# Patient Record
Sex: Female | Born: 1935 | Race: White | Hispanic: No | Smoking: Former smoker
Health system: Southern US, Community
[De-identification: ages and names within clinical notes are randomized; demographics above are authoritative.]

## PROBLEM LIST (undated history)

## (undated) DIAGNOSIS — T8859XA Other complications of anesthesia, initial encounter: Secondary | ICD-10-CM

## (undated) DIAGNOSIS — I209 Angina pectoris, unspecified: Secondary | ICD-10-CM

## (undated) DIAGNOSIS — Z87898 Personal history of other specified conditions: Secondary | ICD-10-CM

## (undated) DIAGNOSIS — C801 Malignant (primary) neoplasm, unspecified: Secondary | ICD-10-CM

## (undated) DIAGNOSIS — I214 Non-ST elevation (NSTEMI) myocardial infarction: Secondary | ICD-10-CM

## (undated) DIAGNOSIS — I10 Essential (primary) hypertension: Secondary | ICD-10-CM

## (undated) DIAGNOSIS — I639 Cerebral infarction, unspecified: Secondary | ICD-10-CM

## (undated) DIAGNOSIS — J189 Pneumonia, unspecified organism: Secondary | ICD-10-CM

## (undated) DIAGNOSIS — R112 Nausea with vomiting, unspecified: Secondary | ICD-10-CM

## (undated) DIAGNOSIS — M1990 Unspecified osteoarthritis, unspecified site: Secondary | ICD-10-CM

## (undated) DIAGNOSIS — E785 Hyperlipidemia, unspecified: Secondary | ICD-10-CM

## (undated) DIAGNOSIS — I2510 Atherosclerotic heart disease of native coronary artery without angina pectoris: Secondary | ICD-10-CM

## (undated) DIAGNOSIS — Z9889 Other specified postprocedural states: Secondary | ICD-10-CM

## (undated) DIAGNOSIS — T4145XA Adverse effect of unspecified anesthetic, initial encounter: Secondary | ICD-10-CM

## (undated) HISTORY — DX: Hyperlipidemia, unspecified: E78.5

## (undated) HISTORY — DX: Atherosclerotic heart disease of native coronary artery without angina pectoris: I25.10

## (undated) HISTORY — PX: EYE SURGERY: SHX253

## (undated) HISTORY — DX: Non-ST elevation (NSTEMI) myocardial infarction: I21.4

---

## 1969-08-17 HISTORY — PX: FRACTURE SURGERY: SHX138

## 1972-08-17 HISTORY — PX: ABDOMINAL HYSTERECTOMY: SHX81

## 1974-08-17 HISTORY — PX: BREAST SURGERY: SHX581

## 1998-10-31 ENCOUNTER — Other Ambulatory Visit: Admission: RE | Admit: 1998-10-31 | Discharge: 1998-10-31 | Payer: Self-pay | Admitting: Obstetrics and Gynecology

## 2000-01-05 ENCOUNTER — Other Ambulatory Visit: Admission: RE | Admit: 2000-01-05 | Discharge: 2000-01-05 | Payer: Self-pay | Admitting: Obstetrics and Gynecology

## 2001-01-06 ENCOUNTER — Other Ambulatory Visit: Admission: RE | Admit: 2001-01-06 | Discharge: 2001-01-06 | Payer: Self-pay | Admitting: Obstetrics and Gynecology

## 2001-01-07 ENCOUNTER — Encounter (INDEPENDENT_AMBULATORY_CARE_PROVIDER_SITE_OTHER): Payer: Self-pay

## 2001-01-07 ENCOUNTER — Other Ambulatory Visit: Admission: RE | Admit: 2001-01-07 | Discharge: 2001-01-07 | Payer: Self-pay | Admitting: Obstetrics and Gynecology

## 2002-01-11 ENCOUNTER — Other Ambulatory Visit: Admission: RE | Admit: 2002-01-11 | Discharge: 2002-01-11 | Payer: Self-pay | Admitting: Obstetrics and Gynecology

## 2003-11-20 ENCOUNTER — Encounter: Admission: RE | Admit: 2003-11-20 | Discharge: 2003-11-20 | Payer: Self-pay | Admitting: Internal Medicine

## 2003-11-23 ENCOUNTER — Emergency Department (HOSPITAL_COMMUNITY): Admission: EM | Admit: 2003-11-23 | Discharge: 2003-11-23 | Payer: Self-pay | Admitting: Emergency Medicine

## 2003-12-12 ENCOUNTER — Inpatient Hospital Stay (HOSPITAL_COMMUNITY): Admission: EM | Admit: 2003-12-12 | Discharge: 2003-12-20 | Payer: Self-pay | Admitting: Emergency Medicine

## 2004-01-01 ENCOUNTER — Ambulatory Visit (HOSPITAL_COMMUNITY): Admission: RE | Admit: 2004-01-01 | Discharge: 2004-01-01 | Payer: Self-pay | Admitting: General Surgery

## 2004-10-11 ENCOUNTER — Emergency Department (HOSPITAL_COMMUNITY): Admission: EM | Admit: 2004-10-11 | Discharge: 2004-10-11 | Payer: Self-pay | Admitting: Family Medicine

## 2004-11-15 ENCOUNTER — Emergency Department (HOSPITAL_COMMUNITY): Admission: EM | Admit: 2004-11-15 | Discharge: 2004-11-15 | Payer: Self-pay | Admitting: Family Medicine

## 2004-12-19 ENCOUNTER — Ambulatory Visit (HOSPITAL_COMMUNITY): Admission: RE | Admit: 2004-12-19 | Discharge: 2004-12-19 | Payer: Self-pay | Admitting: Gastroenterology

## 2004-12-19 ENCOUNTER — Encounter (INDEPENDENT_AMBULATORY_CARE_PROVIDER_SITE_OTHER): Payer: Self-pay | Admitting: *Deleted

## 2005-09-18 ENCOUNTER — Emergency Department (HOSPITAL_COMMUNITY): Admission: EM | Admit: 2005-09-18 | Discharge: 2005-09-18 | Payer: Self-pay | Admitting: Family Medicine

## 2005-11-21 ENCOUNTER — Encounter: Admission: RE | Admit: 2005-11-21 | Discharge: 2005-11-21 | Payer: Self-pay | Admitting: Internal Medicine

## 2005-11-30 ENCOUNTER — Encounter: Admission: RE | Admit: 2005-11-30 | Discharge: 2005-11-30 | Payer: Self-pay | Admitting: Internal Medicine

## 2005-12-08 ENCOUNTER — Encounter: Admission: RE | Admit: 2005-12-08 | Discharge: 2005-12-08 | Payer: Self-pay | Admitting: Internal Medicine

## 2005-12-14 ENCOUNTER — Encounter (INDEPENDENT_AMBULATORY_CARE_PROVIDER_SITE_OTHER): Payer: Self-pay | Admitting: *Deleted

## 2005-12-14 ENCOUNTER — Ambulatory Visit (HOSPITAL_COMMUNITY): Admission: RE | Admit: 2005-12-14 | Discharge: 2005-12-14 | Payer: Self-pay | Admitting: Internal Medicine

## 2007-08-18 DIAGNOSIS — I639 Cerebral infarction, unspecified: Secondary | ICD-10-CM

## 2007-08-18 HISTORY — DX: Cerebral infarction, unspecified: I63.9

## 2008-02-26 ENCOUNTER — Emergency Department (HOSPITAL_COMMUNITY): Admission: EM | Admit: 2008-02-26 | Discharge: 2008-02-26 | Payer: Self-pay | Admitting: Family Medicine

## 2009-04-05 ENCOUNTER — Emergency Department (HOSPITAL_COMMUNITY): Admission: EM | Admit: 2009-04-05 | Discharge: 2009-04-05 | Payer: Self-pay | Admitting: Emergency Medicine

## 2009-07-24 ENCOUNTER — Emergency Department (HOSPITAL_COMMUNITY): Admission: EM | Admit: 2009-07-24 | Discharge: 2009-07-24 | Payer: Self-pay | Admitting: Family Medicine

## 2010-08-03 ENCOUNTER — Emergency Department (HOSPITAL_COMMUNITY)
Admission: EM | Admit: 2010-08-03 | Discharge: 2010-08-03 | Payer: Self-pay | Source: Home / Self Care | Admitting: Emergency Medicine

## 2010-11-03 ENCOUNTER — Inpatient Hospital Stay (INDEPENDENT_AMBULATORY_CARE_PROVIDER_SITE_OTHER)
Admission: RE | Admit: 2010-11-03 | Discharge: 2010-11-03 | Disposition: A | Discharge status: Home / Self Care | Payer: Medicare Other | Source: Ambulatory Visit | Attending: Emergency Medicine | Admitting: Emergency Medicine

## 2010-11-03 DIAGNOSIS — J029 Acute pharyngitis, unspecified: Secondary | ICD-10-CM

## 2010-11-03 DIAGNOSIS — I10 Essential (primary) hypertension: Secondary | ICD-10-CM

## 2010-11-03 DIAGNOSIS — J309 Allergic rhinitis, unspecified: Secondary | ICD-10-CM

## 2011-01-02 NOTE — Op Note (Signed)
Crystal Day, RAMAKER                ACCOUNT NO.:  [id number]   MEDICAL RECORD NO.:  [id number]          PATIENT TYPE:  AMB   LOCATION:  ENDO                         FACILITY:  [facility]   PHYSICIAN:  Bernette Redbird, M.D.   DATE OF BIRTH:  1934/12/19   DATE OF PROCEDURE:  12/19/2004  DATE OF DISCHARGE:                                 OPERATIVE REPORT   PROCEDURE:  Colonoscopy with biopsy.   INDICATION:  A 76 year old for colon cancer screening.   ENDOSCOPIST:  Bernette Redbird, M.D.   FINDINGS:  Diminutive rectal polyps.   DESCRIPTION OF PROCEDURE:  The nature, purpose, and risks of the procedure  have been discussed with the patient who provided written consent. Sedation  was fentanyl 100 mcg and Versed 9.5 mg IV without arrhythmias or  desaturation. The Olympus adjustable tension pediatric video colonoscope was  advanced with moderate difficulty through a fixated and angulated sigmoid  region and then fairly easily, with the help of some external abdominal  compression with the patient in the supine position, to the terminal ileum  which was entered for a moderate distance and appeared normal. Pullback was  then performed. The quality of the prep was excellent, and it is felt that  all areas were well seen.   In the rectum were some diminutive sessile hyperplastic-appearing polyps  which were sampled by cold biopsy, although some of the smallest latticed  ones were not biopsied. There were no worrisome polyps on this exam, no  large polyps, no cancer, colitis, vascular malformations, or diverticulosis.  Retroflexion was attempted to the rectum but was not accomplished due to a  small rectal ampulla.   The patient tolerated the procedure well, and there no apparent  complications.   IMPRESSION:  Diminutive rectal polyps biopsied as described above (211.4).   PLAN:  Await pathology results.     RB/MEDQ  D:  12/19/2004  T:  12/19/2004  Job:  [id number]   cc:   Georgann Housekeeper, MD  [address]  [place]  [place] [address]  Fax: [phone]

## 2011-01-02 NOTE — H&P (Signed)
NAMESHANNEN, FLANSBURG                          ACCOUNT NO.:  [id number]   MEDICAL RECORD NO.:  [id number]                   PATIENT TYPE:  INP   LOCATION:  5725                                 FACILITY:  [facility]   PHYSICIAN:  Georgann Housekeeper, M.D.                 DATE OF BIRTH:  Dec 31, 1934   DATE OF ADMISSION:  12/12/2003  DATE OF DISCHARGE:                                HISTORY & PHYSICAL   PRIMARY CARE PHYSICIAN:  Dr. Lilla Shook.   CHIEF COMPLAINT:  Abdominal pain and nausea.   HISTORY OF PRESENT ILLNESS:  A 76 year old female with no significant past  medical history, initially started having some abdominal pain and some  nausea symptoms, started having these about a week when she had some beans  at the school and she had a little bit of a diarrheal illness 2 days after  that; she thought it food-related.  She continues to have some abdominal  pain and some mild distention and diarrhea stopped and there was no nausea  and she was seen on December 10, 2003; at that time, her blood work including  blood chemistries, lipase, amylase, CBC and urinalysis were all normal.  Her  x-rays showed some questionable partial bowel obstruction.  The patient was  sent home on a liquid diet and because she was not having any fevers or any  vomiting or nausea, she continued on a liquid diet for the next 2 days, came  back with symptoms of still abdominal pain and some nausea, no diarrhea; she  had a little bit of loose stools too, feels the belly is pretty sore and  still mildly distended, no fevers, no blood in the stool or vomitus.   PAST MEDICAL HISTORY:  Postmenopausal.   MEDICATIONS:  Premarin 0.625 mg daily.   ALLERGIES:  Allergies to PENICILLIN, TETRACYCLINE, ERYTHROMYCIN and  MACRODANTIN.   PAST SURGICAL HISTORY:  1. Status post TAH.  2. Status post appendectomy.  3. Status post lump removed from the breast, benign.   SOCIAL HISTORY:  No tobacco, no alcohol.  Married with 2  children.   REVIEW OF SYSTEMS:  Review of systems as above.   PHYSICAL EXAMINATION:  VITAL SIGNS:  On exam, temperature 97.6, blood  pressure 130/80, pulse 68.  GENERAL:  Well-appearing female in mild distress secondary to the pain.  HEENT:  Pupils reactive.  Extraocular muscles intact.  NECK:  Neck was supple.  LUNGS:  Lungs were clear.  CARDIAC:  Exam reveals S1 and S2 without murmurs.  ABDOMEN:  Abdomen was soft; slightly high-pitched bowel sounds, slightly  decreased; tender lower abdomen, diffuse.  No rebound or guarding.  NEUROLOGIC:  Exam nonfocal.   LABORATORY DATA:  Laboratory data from December 10, 2003:  White count was 8.2;  lipase 29, amylase 59.  Chemistries:  Sodium 139, potassium 4.6, BUN of 11,  creatinine 0.9.  LFTs were normal.  UA was negative.   X-ray showed partial small-bowel obstruction.  All the data is from December 10, 2003.   Labs from today and x-rays are pending.   IMPRESSION:  [age]-year-old female with abdominal pain and nausea over  the last week with some small-bowel obstruction on the x-ray, history of  abdominal surgeries.   Partial small-bowel obstruction, rule out mechanical adhesions.   PLAN:  Admit to floor to the [facility] hospitalists.  CT scan of the abdomen and  pelvic.  N.p.o., normal saline fluids with some potassium, NG tube.  If  __________ followup on the blood work including blood chemistries and CBC  and surgical consult if needed.                                                Georgann Housekeeper, M.D.    Arliss Journey  D:  12/12/2003  T:  12/12/2003  Job:  [id number]

## 2011-01-02 NOTE — Consult Note (Signed)
NAME:  Crystal Day, Crystal Day                          ACCOUNT NO.:  [id number]   MEDICAL RECORD NO.:  [id number]                   PATIENT TYPE:  INP   LOCATION:  5704                                 FACILITY:  [facility]   PHYSICIAN:  Timothy E. Earlene Plater, M.D.              DATE OF BIRTH:  July 30, 1935   DATE OF CONSULTATION:  12/18/2003  DATE OF DISCHARGE:                                   CONSULTATION   I have been asked to see Ms. Mcmath today by Dr. Julio Sicks regarding  abdominal pain and possible bowel obstruction.   She is 56, is up and about the room feeling better than five days ago and in  no acute distress at this time.  She converses freely about her disease  process.  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  She did not give it much thought, but it did progress with mild  discomfort and no further diarrhea.  However, prior to admission on the  27th, she experienced more and more abdominal pain and although she had one  solid bowel movement, for the most part she began having diarrhea.  She was  seen in the office one day and admitted the next day.  Abdominal films  reportedly showed evidence of bowel obstruction.  The patient has been in  the hospital now six days, has had multiple x-rays including three CT scans.  She felt as though she was making progress advancing from clear liquids to  regular diet which she tried to eat and was able to only eat small amounts.  Her bowels had been loose until 72 hours ago and she has had no bowel  movement since.  Her CT scans actually show improvement in the evidence for  bowel obstruction, i.e. dilated small bowel.  She has never had an episode  like this.  She has never been really sick or ill and she has no significant  past history contributory.  She did have a total abdominal hysterectomy and  appendectomy at the same time in 1975 and had a  breast biopsy remotely.  She  has not had any GI problems or distress in the interim and this is her first  episode of real abdominal illness.   I have reviewed her dietary history, her laboratory value, her vital signs.   A careful review of her x-rays was done in the x-ray department.   PHYSICAL EXAMINATION:  GENERAL:  Today as noted the patient is up and about  the room.  She is in no distress.  She is afebrile.  VITAL SIGNS:  Good.  CHEST:  Clear to auscultation.  HEART:  Regular and normal.  ABDOMEN:  Distended, soft.  Bowel sounds are diminished.  There is no pain  with percussion or palpation today.  I find no evidence  of masses or  organomegaly at this time.   I would strongly suggest stopping her regular food, cutting back to clear  liquids only, increasing her IV fluids, checking her electrolytes and I will  repeat today a simple flat and upright abdomen which should give [place] a good  idea of her progress.  I appreciate this consultation and will be glad to  follow with you.                                               Timothy E. Earlene Plater, M.D.    TED/MEDQ  D:  12/18/2003  T:  12/19/2003  Job:  [id number]

## 2011-01-02 NOTE — Discharge Summary (Signed)
Crystal Day, Crystal Day                          ACCOUNT NO.:  [id number]   MEDICAL RECORD NO.:  [id number]                   PATIENT TYPE:  INP   LOCATION:  5704                                 FACILITY:  [facility]   PHYSICIAN:  Crystal Day, M.D.             DATE OF BIRTH:  [birth year]   DATE OF ADMISSION:  12/12/2003  DATE OF DISCHARGE:  12/20/2003                                 DISCHARGE SUMMARY   DISCHARGE DIAGNOSES:  1. Partial small-bowel obstruction -- almost completely resolved.  2. Abdominal pain and nausea -- resolved.   DISCHARGE MEDICATIONS:  1. The patient is to resume her preadmission medications of Premarin 0.625     mg daily.  2. New medicine is Reglan 5 mg t.i.d. p.o.   DISCHARGE LABORATORIES:  WBC count 5.4, hemoglobin 13.8, hematocrit 39.3,  MCV 86.0, platelet count 198,000.  Sodium 139, potassium 4.0, chloride 110,  CO2 26, glucose 98, BUN 7, creatinine 0.9, calcium 8.8.   CONSULTANTS:  Dr. Sheppard Plumber. Earlene Plater of general surgery.   PROCEDURES:  Not applicable.   CONDITION ON DISCHARGE:  Improved and satisfactory.   ACTIVITY:  Activity as tolerated.   DIET:  Full liquid diet.  The patient is to advance diet as tolerated; she  will be given instructions in this respect prior to discharge.   SPECIAL INSTRUCTIONS:  Special instructions including dietary instructions  to be given to patient, as mentioned above.  She was instructed to report to  doctor if she experiences any symptoms of nausea, vomiting, abdominal pain.   FOLLOWUP:  She is to call Dr. Earlene Plater for an appointment to see him in 10-14  days.  Routine followup with her primary care physician, Dr. Lilla Shook.   REASON FOR HOSPITALIZATION:  Abdominal pain and nausea secondary to partial  small-bowel obstruction.  Please see admission H&P by Dr. Georgann Housekeeper  dated December 12, 2003.  She presented with history of abdominal pain and  nausea which had been going about a week prior to admission  after eating  beans.  She was admitted to the hospital after being seen in the doctor's  office on account of bowel obstruction on x-ray.   According to Dr. Jerelyn Scott Husain's H&P, the patient's BP was 130/80, pulse of  58, temperature of 97.6 degrees Fahrenheit.  She was in mild painful  distress at the time of admission.  Her lungs were clear.  Cardiac exam was  unremarkable.  Abdominal exam was said to have shown high-pitched bowel  sounds with some lower abdominal tenderness.  Neurological exam was said to  be nonfocal.   X-rays showed a partial small-bowel obstruction; she was therefore admitted  to the hospital for management of bowel obstruction and abdominal pain.   HOSPITAL COURSE:  She was admitted to the hospitalists' service.  CT scan of  the abdomen was done which confirmed bowel obstruction.  She was initially  made n.p.o. with IV fluid supplementation and monitoring of her electrolytes  and correction of hypokalemia.  With this bowel rest regimen, the patient's  symptoms improved somewhat and subsequently, her diet was slightly adjusted  to clear liquids.  She had been nauseated and also received antiemetics in  addition to pain medicines.  On Dec 21, 2003, the patient's symptoms have  gotten worse after being maintained on a regular diet and therefore, Dr.  Earlene Plater of surgery was consulted, who recommended cutting the patient back to  clear liquids with monitoring of electrolytes and IV fluid supplementation.  Over the last 48 hours, the patient's symptoms have improved remarkably and  she does not have any abdominal pain or nausea on full liquids and she is  deemed appropriate for discharge today.  On rounds this morning, the patient  denies any fever, chills, abdominal pain, nausea or vomiting.  She has been  able to open her bowels without any problems; she has passed gas as well.  Her abdomen is no more distended, her bowel sounds were present and abdomen  was nontender on  palpation.  Her BP was 136/63, pulse rate of 57,  temperature 97.4 degrees Fahrenheit, an O2 saturation of 96% on room air,  respiratory rate of 18.  Lungs were clear to auscultation.  Cardiac  auscultation was negative for any gallops or murmur.  She does not have any  edema on extremity exam and she is clinically not dehydrated.  The patient  is therefore going to be discharged home on full liquids with instructions  on advancing her diet as tolerated, to report to M.D. should there be any  problems.  She does not have any dizziness or lightheadedness and is not  clinically dehydrated, as mentioned above, and she is deemed appropriate for  home.  She is not ill-looking or lethargic-looking also, but she has been  ambulatory on the medical floor without any problems, as mentioned above.                                                Crystal Day, M.D.    GO/MEDQ  D:  12/20/2003  T:  12/20/2003  Job:  [id number]   cc:   Sheppard Plumber. Earlene Plater, M.D.  [address]  [place]  [place] [address]  Fax: [phone]   Lilla Shook, M.D.  [address]  [place]  [place] [address]  Fax: [phone]

## 2013-09-28 ENCOUNTER — Encounter (HOSPITAL_COMMUNITY): Payer: Self-pay | Admitting: Emergency Medicine

## 2013-09-28 ENCOUNTER — Inpatient Hospital Stay (HOSPITAL_COMMUNITY)
Admission: EM | Admit: 2013-09-28 | Discharge: 2013-09-30 | DRG: 281 | Disposition: A | Discharge status: Home / Self Care | Payer: Medicare Other | Attending: Cardiovascular Disease | Admitting: Cardiovascular Disease

## 2013-09-28 ENCOUNTER — Emergency Department (HOSPITAL_COMMUNITY): Payer: Medicare Other

## 2013-09-28 DIAGNOSIS — R079 Chest pain, unspecified: Secondary | ICD-10-CM

## 2013-09-28 DIAGNOSIS — N189 Chronic kidney disease, unspecified: Secondary | ICD-10-CM | POA: Diagnosis present

## 2013-09-28 DIAGNOSIS — I214 Non-ST elevation (NSTEMI) myocardial infarction: Principal | ICD-10-CM

## 2013-09-28 DIAGNOSIS — I129 Hypertensive chronic kidney disease with stage 1 through stage 4 chronic kidney disease, or unspecified chronic kidney disease: Secondary | ICD-10-CM | POA: Diagnosis present

## 2013-09-28 DIAGNOSIS — N179 Acute kidney failure, unspecified: Secondary | ICD-10-CM | POA: Diagnosis present

## 2013-09-28 DIAGNOSIS — I10 Essential (primary) hypertension: Secondary | ICD-10-CM | POA: Diagnosis present

## 2013-09-28 DIAGNOSIS — R7309 Other abnormal glucose: Secondary | ICD-10-CM | POA: Diagnosis present

## 2013-09-28 DIAGNOSIS — Z87891 Personal history of nicotine dependence: Secondary | ICD-10-CM

## 2013-09-28 DIAGNOSIS — E785 Hyperlipidemia, unspecified: Secondary | ICD-10-CM | POA: Diagnosis present

## 2013-09-28 HISTORY — DX: Pneumonia, unspecified organism: J18.9

## 2013-09-28 HISTORY — DX: Angina pectoris, unspecified: I20.9

## 2013-09-28 HISTORY — DX: Malignant (primary) neoplasm, unspecified: C80.1

## 2013-09-28 HISTORY — DX: Unspecified osteoarthritis, unspecified site: M19.90

## 2013-09-28 HISTORY — DX: Essential (primary) hypertension: I10

## 2013-09-28 HISTORY — DX: Cerebral infarction, unspecified: I63.9

## 2013-09-28 LAB — CBC WITH DIFFERENTIAL/PLATELET
Basophils Absolute: 0.1 10*3/uL (ref 0.0–0.1)
Basophils Relative: 1 % (ref 0–1)
Eosinophils Absolute: 0.2 10*3/uL (ref 0.0–0.7)
Eosinophils Relative: 3 % (ref 0–5)
HCT: 42.9 % (ref 36.0–46.0)
Hemoglobin: 14.9 g/dL (ref 12.0–15.0)
Lymphocytes Relative: 30 % (ref 12–46)
Lymphs Abs: 2.3 10*3/uL (ref 0.7–4.0)
MCH: 29.6 pg (ref 26.0–34.0)
MCHC: 34.7 g/dL (ref 30.0–36.0)
MCV: 85.1 fL (ref 78.0–100.0)
Monocytes Absolute: 0.5 10*3/uL (ref 0.1–1.0)
Monocytes Relative: 7 % (ref 3–12)
Neutro Abs: 4.6 10*3/uL (ref 1.7–7.7)
Neutrophils Relative %: 60 % (ref 43–77)
Platelets: 185 10*3/uL (ref 150–400)
RBC: 5.04 MIL/uL (ref 3.87–5.11)
RDW: 12.5 % (ref 11.5–15.5)
WBC: 7.7 10*3/uL (ref 4.0–10.5)

## 2013-09-28 LAB — COMPREHENSIVE METABOLIC PANEL
ALT: 9 U/L (ref 0–35)
AST: 23 U/L (ref 0–37)
Albumin: 3.9 g/dL (ref 3.5–5.2)
Alkaline Phosphatase: 57 U/L (ref 39–117)
BUN: 19 mg/dL (ref 6–23)
CO2: 27 mEq/L (ref 19–32)
Calcium: 9.7 mg/dL (ref 8.4–10.5)
Chloride: 100 mEq/L (ref 96–112)
Creatinine, Ser: 1.18 mg/dL — ABNORMAL HIGH (ref 0.50–1.10)
GFR calc Af Amer: 51 mL/min — ABNORMAL LOW (ref >90)
GFR calc non Af Amer: 44 mL/min — ABNORMAL LOW (ref >90)
Glucose, Bld: 164 mg/dL — ABNORMAL HIGH (ref 70–99)
Potassium: 4 mEq/L (ref 3.7–5.3)
Sodium: 143 mEq/L (ref 137–147)
Total Bilirubin: 0.4 mg/dL (ref 0.3–1.2)
Total Protein: 7.3 g/dL (ref 6.0–8.3)

## 2013-09-28 LAB — POCT I-STAT, CHEM 8
BUN: 19 mg/dL (ref 6–23)
Calcium, Ion: 1.18 mmol/L (ref 1.13–1.30)
Chloride: 100 mEq/L (ref 96–112)
Creatinine, Ser: 1.4 mg/dL — ABNORMAL HIGH (ref 0.50–1.10)
Glucose, Bld: 166 mg/dL — ABNORMAL HIGH (ref 70–99)
HCT: 45 % (ref 36.0–46.0)
Hemoglobin: 15.3 g/dL — ABNORMAL HIGH (ref 12.0–15.0)
Potassium: 3.7 mEq/L (ref 3.7–5.3)
Sodium: 142 mEq/L (ref 137–147)
TCO2: 28 mmol/L (ref 0–100)

## 2013-09-28 LAB — POCT I-STAT TROPONIN I: Troponin i, poc: 0.01 ng/mL (ref 0.00–0.08)

## 2013-09-28 LAB — TROPONIN I: Troponin I: 4.64 ng/mL (ref <0.30)

## 2013-09-28 LAB — MRSA PCR SCREENING: MRSA by PCR: NEGATIVE

## 2013-09-28 LAB — LIPASE, BLOOD: Lipase: 28 U/L (ref 11–59)

## 2013-09-28 MED ORDER — ASPIRIN 81 MG PO CHEW
324.0000 mg | CHEWABLE_TABLET | Freq: Once | ORAL | Status: AC
  Administered 2013-09-28: 324 mg via ORAL
  Filled 2013-09-28: qty 4

## 2013-09-28 MED ORDER — ASPIRIN 81 MG PO CHEW
81.0000 mg | CHEWABLE_TABLET | ORAL | Status: AC
  Administered 2013-09-29: 81 mg via ORAL
  Filled 2013-09-28: qty 1

## 2013-09-28 MED ORDER — SODIUM CHLORIDE 0.9 % IJ SOLN
3.0000 mL | Freq: Two times a day (BID) | INTRAMUSCULAR | Status: DC
  Administered 2013-09-28: 3 mL via INTRAVENOUS

## 2013-09-28 MED ORDER — NITROGLYCERIN 0.4 MG SL SUBL
0.4000 mg | SUBLINGUAL_TABLET | Freq: Once | SUBLINGUAL | Status: AC
  Administered 2013-09-28: 0.4 mg via SUBLINGUAL
  Filled 2013-09-28: qty 25

## 2013-09-28 MED ORDER — SODIUM CHLORIDE 0.9 % IV SOLN: 250.0000 mL | INTRAVENOUS | Status: DC | PRN

## 2013-09-28 MED ORDER — ASPIRIN EC 81 MG PO TBEC: 81.0000 mg | DELAYED_RELEASE_TABLET | Freq: Every day | ORAL | Status: DC

## 2013-09-28 MED ORDER — ATORVASTATIN CALCIUM 10 MG PO TABS
10.0000 mg | ORAL_TABLET | Freq: Every day | ORAL | Status: DC
  Filled 2013-09-28: qty 1

## 2013-09-28 MED ORDER — NITROGLYCERIN 0.4 MG SL SUBL: 0.4000 mg | SUBLINGUAL_TABLET | SUBLINGUAL | Status: DC | PRN

## 2013-09-28 MED ORDER — METOPROLOL TARTRATE 25 MG PO TABS
25.0000 mg | ORAL_TABLET | Freq: Two times a day (BID) | ORAL | Status: DC
  Administered 2013-09-28 – 2013-09-30 (×3): 25 mg via ORAL
  Filled 2013-09-28 (×5): qty 1

## 2013-09-28 MED ORDER — ONDANSETRON HCL 4 MG/2ML IJ SOLN
4.0000 mg | Freq: Once | INTRAMUSCULAR | Status: AC
  Administered 2013-09-28: 4 mg via INTRAVENOUS
  Filled 2013-09-28: qty 2

## 2013-09-28 MED ORDER — SODIUM CHLORIDE 0.9 % IJ SOLN: 3.0000 mL | INTRAMUSCULAR | Status: DC | PRN

## 2013-09-28 MED ORDER — NITROGLYCERIN IN D5W 200-5 MCG/ML-% IV SOLN
5.0000 ug/min | INTRAVENOUS | Status: DC
  Administered 2013-09-28: 5 ug/min via INTRAVENOUS
  Filled 2013-09-28: qty 250

## 2013-09-28 MED ORDER — HEPARIN BOLUS VIA INFUSION
3000.0000 [IU] | Freq: Once | INTRAVENOUS | Status: AC
  Administered 2013-09-28: 3000 [IU] via INTRAVENOUS
  Filled 2013-09-28: qty 3000

## 2013-09-28 MED ORDER — HEPARIN (PORCINE) IN NACL 100-0.45 UNIT/ML-% IJ SOLN
950.0000 [IU]/h | INTRAMUSCULAR | Status: DC
  Administered 2013-09-28: 1100 [IU]/h via INTRAVENOUS
  Filled 2013-09-28 (×2): qty 250

## 2013-09-28 NOTE — ED Provider Notes (Signed)
CSN: [id number]     Arrival date & time 09/28/13  1458 History   First MD Initiated Contact with Patient 09/28/13 1508     Chief Complaint  Patient presents with  . Chest Pain     (Consider location/radiation/quality/duration/timing/severity/associated sxs/prior Treatment) Patient is a 79 y.o. female presenting with chest pain. The history is provided by the patient.  Chest Pain Associated symptoms: nausea   Associated symptoms: no abdominal pain, no back pain, no headache, no numbness, no shortness of breath, not vomiting and no weakness   the patient developed chest pressure around 30 minutes prior to arrival. She states it was sharp and radiated somewhat to her back. Is also pressure with it. She states she's been having pains to last year when she lays down at night. They come and go. She states that pain is different than this pain. She states she's had some nausea. No fevers. No cough. No weight loss. She is a former smoker.  Past Medical History  Diagnosis Date  . Hypertension    History reviewed. No pertinent past surgical history. Family History  Problem Relation Age of Onset  . Heart attack Father 48  . Cancer Mother    History  Substance Use Topics  . Smoking status: Former Smoker    Quit date: 08/17/1981  . Smokeless tobacco: Not on file  . Alcohol Use: Not on file   OB History   Grav Para Term Preterm Abortions TAB SAB Ect Mult Living                 Review of Systems  Constitutional: Negative for activity change and appetite change.  Eyes: Negative for pain.  Respiratory: Negative for chest tightness and shortness of breath.   Cardiovascular: Positive for chest pain. Negative for leg swelling.  Gastrointestinal: Positive for nausea. Negative for vomiting, abdominal pain and diarrhea.  Genitourinary: Negative for flank pain.  Musculoskeletal: Negative for back pain and neck stiffness.  Skin: Negative for rash.  Neurological: Negative for weakness, numbness  and headaches.  Psychiatric/Behavioral: Negative for behavioral problems.      Allergies  Penicillins; Tetracyclines & related; Elavil; Erythromycin; and Macrodantin  Home Medications   Current Outpatient Rx  Name  Route  Sig  Dispense  Refill  . valsartan-hydrochlorothiazide (DIOVAN-HCT) 320-25 MG per tablet   Oral   Take 1 tablet by mouth daily.          BP 154/72  Pulse 82  Temp(Src) 97.2 F (36.2 C) (Oral)  Resp 13  Wt 155 lb (70.308 kg)  SpO2 96% Physical Exam  Nursing note and vitals reviewed. Constitutional: She is oriented to person, place, and time. She appears well-developed and well-nourished.  HENT:  Head: Normocephalic and atraumatic.  Eyes: EOM are normal. Pupils are equal, round, and reactive to light.  Neck: Normal range of motion. Neck supple.  Cardiovascular: Normal rate, regular rhythm and normal heart sounds.   No murmur heard. Pulmonary/Chest: Effort normal and breath sounds normal. No respiratory distress. She has no wheezes. She has no rales.  Abdominal: Soft. Bowel sounds are normal. She exhibits no distension. There is no tenderness. There is no rebound and no guarding.  Musculoskeletal: Normal range of motion.  Neurological: She is alert and oriented to person, place, and time. No cranial nerve deficit.  Skin: Skin is warm and dry.  Psychiatric: She has a normal mood and affect. Her speech is normal.    ED Course  Procedures (including critical care time) Labs  Review Labs Reviewed  COMPREHENSIVE METABOLIC PANEL - Abnormal; Notable for the following:    Glucose, Bld 164 (*)    Creatinine, Ser 1.18 (*)    GFR calc non Af Amer 44 (*)    GFR calc Af Amer 51 (*)    All other components within normal limits  POCT I-STAT, CHEM 8 - Abnormal; Notable for the following:    Creatinine, Ser 1.40 (*)    Glucose, Bld 166 (*)    Hemoglobin 15.3 (*)    All other components within normal limits  CBC WITH DIFFERENTIAL  LIPASE, BLOOD  POCT I-STAT  TROPONIN I   Imaging Review Dg Chest Port 1 View  09/28/2013   CLINICAL DATA:  Chest pain  EXAM: PORTABLE CHEST - 1 VIEW  COMPARISON:  DG CHEST 2 VIEW dated 08/03/2010; DG CHEST 2 VIEW dated 02/26/2008; US BIOPSY dated 12/14/2005  FINDINGS: Hyperinflation suggests COPD. Heart size is normal. Vascular pattern is normal. Lungs are clear. No pleural effusions.  IMPRESSION: No active disease.   Electronically Signed   By: Skipper Cliche M.D.   On: 09/28/2013 16:09    EKG Interpretation    Date/Time:  Thursday September 28 2013 15:02:24 EST Ventricular Rate:  99 PR Interval:  156 QRS Duration: 92 QT Interval:  372 QTC Calculation: 477 R Axis:   73 Text Interpretation:  Sinus rhythm with occasional Premature ventricular complexes Biatrial enlargement Marked ST abnormality, possible inferior subendocardial injury Abnormal ECG Confirmed by Grissel Tyrell  MD, Izacc Demeyer ([id number]) on 09/28/2013 3:10:09 PM            MDM   Final diagnoses:  Chest pain    Patient with chest pain. Has had pain for a year, but states his pain is different. The pain is improved with nitroglycerin. EKG shows nonspecific changes, but unknown if they're new or old. Troponin is negative. Pain is improved. Will be admitted by cardiologist    Jasper Riling. Alvino Chapel, MD 09/28/13 1718

## 2013-09-28 NOTE — H&P (Signed)
Crystal Day is an 79 y.o. female.   Chief Complaint: Chest Pain HPI:   The patient is a very active 79 yo female with a history of HTN, for which she takes losartan, heart murmur, and indigestion.  She presents with CP which had a sudden onset while walking to the mailbox.  This was right after eating a large lunch.  She had walked two miles earlier in the day and did senior aerobics without incident.  The pain was sharp, 10/10 with no SOB, diaphoresis or radiation.  She did become nauseated after arriving in the ER. For the last year she has been having chest pain, only at night, which wakes her from sleep and last 2-60 seconds.  BP was elevated at 218/81 and has improved on IV nitroglycerin.    Past Medical History  Diagnosis Date  . Hypertension     History reviewed. No pertinent past surgical history.  History reviewed. No pertinent family history. Social History:  reports that she has never smoked. She does not have any smokeless tobacco history on file. Her alcohol and drug histories are not on file.  Allergies:  Allergies  Allergen Reactions  . Penicillins Hives  . Tetracyclines & Related Nausea Only  . Elavil [Amitriptyline] Rash  . Erythromycin Rash  . Macrodantin [Nitrofurantoin Macrocrystal] Rash     (Not in a hospital admission)  Results for orders placed during the hospital encounter of 09/28/13 (from the past 48 hour(s))  CBC WITH DIFFERENTIAL     Status: None   Collection Time    09/28/13  3:19 PM      Result Value Ref Range   WBC 7.7  4.0 - 10.5 K/uL   RBC 5.04  3.87 - 5.11 MIL/uL   Hemoglobin 14.9  12.0 - 15.0 g/dL   HCT 42.9  36.0 - 46.0 %   MCV 85.1  78.0 - 100.0 fL   MCH 29.6  26.0 - 34.0 pg   MCHC 34.7  30.0 - 36.0 g/dL   RDW 12.5  11.5 - 15.5 %   Platelets 185  150 - 400 K/uL   Neutrophils Relative % 60  43 - 77 %   Neutro Abs 4.6  1.7 - 7.7 K/uL   Lymphocytes Relative 30  12 - 46 %   Lymphs Abs 2.3  0.7 - 4.0 K/uL   Monocytes Relative 7  3 - 12  %   Monocytes Absolute 0.5  0.1 - 1.0 K/uL   Eosinophils Relative 3  0 - 5 %   Eosinophils Absolute 0.2  0.0 - 0.7 K/uL   Basophils Relative 1  0 - 1 %   Basophils Absolute 0.1  0.0 - 0.1 K/uL  COMPREHENSIVE METABOLIC PANEL     Status: Abnormal   Collection Time    09/28/13  3:19 PM      Result Value Ref Range   Sodium 143  137 - 147 mEq/L   Potassium 4.0  3.7 - 5.3 mEq/L   Chloride 100  96 - 112 mEq/L   CO2 27  19 - 32 mEq/L   Glucose, Bld 164 (*) 70 - 99 mg/dL   BUN 19  6 - 23 mg/dL   Creatinine, Ser 1.18 (*) 0.50 - 1.10 mg/dL   Calcium 9.7  8.4 - 10.5 mg/dL   Total Protein 7.3  6.0 - 8.3 g/dL   Albumin 3.9  3.5 - 5.2 g/dL   AST 23  0 - 37 U/L   ALT  9  0 - 35 U/L   Alkaline Phosphatase 57  39 - 117 U/L   Total Bilirubin 0.4  0.3 - 1.2 mg/dL   GFR calc non Af Amer 44 (*) >90 mL/min   GFR calc Af Amer 51 (*) >90 mL/min   Comment: (NOTE)     The eGFR has been calculated using the CKD EPI equation.     This calculation has not been validated in all clinical situations.     eGFR's persistently <90 mL/min signify possible Chronic Kidney     Disease.  LIPASE, BLOOD     Status: None   Collection Time    09/28/13  3:19 PM      Result Value Ref Range   Lipase 28  11 - 59 U/L  POCT I-STAT TROPONIN I     Status: None   Collection Time    09/28/13  3:28 PM      Result Value Ref Range   Troponin i, poc 0.01  0.00 - 0.08 ng/mL   Comment 3            Comment: Due to the release kinetics of cTnI,     a negative result within the first hours     of the onset of symptoms does not rule out     myocardial infarction with certainty.     If myocardial infarction is still suspected,     repeat the test at appropriate intervals.  POCT I-STAT, CHEM 8     Status: Abnormal   Collection Time    09/28/13  3:30 PM      Result Value Ref Range   Sodium 142  137 - 147 mEq/L   Potassium 3.7  3.7 - 5.3 mEq/L   Chloride 100  96 - 112 mEq/L   BUN 19  6 - 23 mg/dL   Creatinine, Ser 1.40 (*) 0.50 -  1.10 mg/dL   Glucose, Bld 166 (*) 70 - 99 mg/dL   Calcium, Ion 1.18  1.13 - 1.30 mmol/L   TCO2 28  0 - 100 mmol/L   Hemoglobin 15.3 (*) 12.0 - 15.0 g/dL   HCT 45.0  36.0 - 46.0 %   Dg Chest Port 1 View  09/28/2013   CLINICAL DATA:  Chest pain  EXAM: PORTABLE CHEST - 1 VIEW  COMPARISON:  DG CHEST 2 VIEW dated 08/03/2010; DG CHEST 2 VIEW dated 02/26/2008; US BIOPSY dated 12/14/2005  FINDINGS: Hyperinflation suggests COPD. Heart size is normal. Vascular pattern is normal. Lungs are clear. No pleural effusions.  IMPRESSION: No active disease.   Electronically Signed   By: Skipper Cliche M.D.   On: 09/28/2013 16:09    Review of Systems  Constitutional: Negative for fever and diaphoresis.  HENT: Negative for congestion and sore throat.   Respiratory: Negative for cough and shortness of breath.   Cardiovascular: Positive for chest pain and palpitations ("racing heart" sometimes). Negative for orthopnea, leg swelling and PND.  Gastrointestinal: Positive for nausea. Negative for vomiting, blood in stool and melena.  Genitourinary: Negative for hematuria.  Musculoskeletal: Negative for myalgias.  Neurological: Negative for dizziness.  All other systems reviewed and are negative.    Blood pressure 158/86, pulse 90, temperature 97.2 F (36.2 C), temperature source Oral, resp. rate 14, weight 155 lb (70.308 kg), SpO2 96.00%. Physical Exam  Nursing note and vitals reviewed. Constitutional: She is oriented to person, place, and time. She appears well-developed and well-nourished. No distress.  HENT:  Head: Normocephalic and  atraumatic.  Mouth/Throat: No oropharyngeal exudate.  Eyes: EOM are normal. Pupils are equal, round, and reactive to light. No scleral icterus.  Neck: Normal range of motion. Neck supple.  Cardiovascular: Normal rate, regular rhythm, S1 normal and S2 normal.   Murmur heard.  Systolic murmur is present with a grade of 1/6  Pulses:      Radial pulses are 2+ on the right side,  and 2+ on the left side.       Dorsalis pedis pulses are 2+ on the right side, and 2+ on the left side.  No carotid bruit  Respiratory: Effort normal and breath sounds normal. She has no wheezes. She has no rales. She exhibits no tenderness.  GI: Soft. Bowel sounds are normal. She exhibits no distension. There is no tenderness.  Musculoskeletal: She exhibits no edema.  Lymphadenopathy:    She has no cervical adenopathy.  Neurological: She is alert and oriented to person, place, and time. She exhibits normal muscle tone.  Skin: Skin is warm and dry.  Psychiatric: She has a normal mood and affect.     Assessment/Plan Principal Problem:   Chest pain Active Problems:   HTN (hypertension)   Acute renal failure   Plan:  79 yo female with history of HTN presents with atypical CP and HTN.  Risk factors:  HTN, family history,  BP improved with IV NTG.  Will admit for observation.  Will hold losartan for ARF.  Will add lopressor for ACS and BP.  Wean off NTG.  Add protonix as this sounds like indigestion as do her night time symptoms.  May also be related to HTN as is her ARF.  GFR 44.  Labs: lipids, A1C, TSH.   Treadmill myoview in the AM.   Can do echo in the office to assess MM.  HAGER, BRYAN 09/28/2013, 4:13 PM    Agree with note written by Luisa Dago PAC  Pt with + CRF, Sx C/W [place] relieved by IV NTG and NSSTTWC. Exam benign. Will Rx with IV hep/NTG, cycle enz. Cath tomorrow,   Lorretta Harp 09/28/2013 6:18 PM

## 2013-09-28 NOTE — ED Notes (Signed)
Pt in c/o sudden onset of chest pain approx 30 min ago, c/o nausea and weakness with this, denies shortness of breath, describes as cramping in the center of her chest, states she has had light intermittent pain in her chest during the night that wakes her up but it resolves quickly

## 2013-09-28 NOTE — ED Notes (Signed)
Patient is resting.  Dr Gwenlyn Found has been at bedside.  Verbal order for heparin per pharmacy.  Patient family at bedside.  Aware of plan to admit

## 2013-09-28 NOTE — Progress Notes (Signed)
ANTICOAGULATION CONSULT NOTE - Initial Consult  Pharmacy Consult for Heparin Indication: chest pain/ACS  Allergies  Allergen Reactions  . Penicillins Hives  . Tetracyclines & Related Nausea Only  . Elavil [Amitriptyline] Rash  . Erythromycin Rash  . Macrodantin [Nitrofurantoin Macrocrystal] Rash    Patient Measurements: Height: 5\' 9"  (175.3 cm) Weight: 155 lb (70.308 kg) IBW/kg (Calculated) : 66.2  Vital Signs: Temp: 97.2 F (36.2 C) (02/12 1507) Temp src: Oral (02/12 1507) BP: 154/72 mmHg (02/12 1630) Pulse Rate: 82 (02/12 1630)  Labs:  Recent Labs  09/28/13 1519 09/28/13 1530  HGB 14.9 15.3*  HCT 42.9 45.0  PLT 185  --   CREATININE 1.18* 1.40*    Estimated Creatinine Clearance: 35.7 ml/min (by C-G formula based on Cr of 1.4).   Medical History: Past Medical History  Diagnosis Date  . Hypertension    Assessment: 79 year old female to start heparin for chest pain  Goal of Therapy:  Heparin level 0.3-0.7 units/ml Monitor platelets by anticoagulation protocol: Yes   Plan:  1) Heparin 3000 units iv bolus x 1 2) Heparin drip at 1100 units / hr 3) Heparin level 8 hours after heparin begins 4) Daily heparin level, CBC  Thank you. Anette Guarneri, PharmD [phone]  Tad Moore 09/28/2013,5:56 PM

## 2013-09-29 ENCOUNTER — Encounter (HOSPITAL_COMMUNITY)
Admission: EM | Disposition: A | Discharge status: Home / Self Care | Payer: Medicare Other | Source: Home / Self Care | Attending: Cardiovascular Disease

## 2013-09-29 ENCOUNTER — Encounter (HOSPITAL_COMMUNITY): Payer: Self-pay | Admitting: Neurology

## 2013-09-29 DIAGNOSIS — E785 Hyperlipidemia, unspecified: Secondary | ICD-10-CM

## 2013-09-29 DIAGNOSIS — R7309 Other abnormal glucose: Secondary | ICD-10-CM

## 2013-09-29 DIAGNOSIS — R079 Chest pain, unspecified: Secondary | ICD-10-CM

## 2013-09-29 DIAGNOSIS — I214 Non-ST elevation (NSTEMI) myocardial infarction: Secondary | ICD-10-CM

## 2013-09-29 HISTORY — PX: LEFT HEART CATHETERIZATION WITH CORONARY ANGIOGRAM: SHX5451

## 2013-09-29 LAB — BASIC METABOLIC PANEL
BUN: 20 mg/dL (ref 6–23)
CO2: 27 mEq/L (ref 19–32)
Calcium: 9.1 mg/dL (ref 8.4–10.5)
Chloride: 104 mEq/L (ref 96–112)
Creatinine, Ser: 1.17 mg/dL — ABNORMAL HIGH (ref 0.50–1.10)
GFR calc Af Amer: 51 mL/min — ABNORMAL LOW (ref >90)
GFR calc non Af Amer: 44 mL/min — ABNORMAL LOW (ref >90)
Glucose, Bld: 119 mg/dL — ABNORMAL HIGH (ref 70–99)
Potassium: 4.3 mEq/L (ref 3.7–5.3)
Sodium: 143 mEq/L (ref 137–147)

## 2013-09-29 LAB — TROPONIN I
Troponin I: 5.38 ng/mL (ref <0.30)
Troponin I: 3.14 ng/mL (ref <0.30)

## 2013-09-29 LAB — CBC
HCT: 39.5 % (ref 36.0–46.0)
HCT: 40.5 % (ref 36.0–46.0)
Hemoglobin: 13.1 g/dL (ref 12.0–15.0)
Hemoglobin: 13.4 g/dL (ref 12.0–15.0)
MCH: 28.8 pg (ref 26.0–34.0)
MCH: 28.8 pg (ref 26.0–34.0)
MCHC: 33.2 g/dL (ref 30.0–36.0)
MCHC: 33.1 g/dL (ref 30.0–36.0)
MCV: 86.8 fL (ref 78.0–100.0)
MCV: 87.1 fL (ref 78.0–100.0)
Platelets: 160 10*3/uL (ref 150–400)
Platelets: 148 10*3/uL — ABNORMAL LOW (ref 150–400)
RBC: 4.55 MIL/uL (ref 3.87–5.11)
RBC: 4.65 MIL/uL (ref 3.87–5.11)
RDW: 12.8 % (ref 11.5–15.5)
RDW: 13 % (ref 11.5–15.5)
WBC: 6.1 10*3/uL (ref 4.0–10.5)
WBC: 5.7 10*3/uL (ref 4.0–10.5)

## 2013-09-29 LAB — HEPARIN LEVEL (UNFRACTIONATED): Heparin Unfractionated: 0.87 IU/mL — ABNORMAL HIGH (ref 0.30–0.70)

## 2013-09-29 LAB — HEMOGLOBIN A1C
Hgb A1c MFr Bld: 5.9 % — ABNORMAL HIGH (ref <5.7)
Mean Plasma Glucose: 123 mg/dL — ABNORMAL HIGH (ref <117)

## 2013-09-29 LAB — LIPID PANEL
Cholesterol: 221 mg/dL — ABNORMAL HIGH (ref 0–200)
HDL: 42 mg/dL (ref >39)
LDL Cholesterol: 151 mg/dL — ABNORMAL HIGH (ref 0–99)
Total CHOL/HDL Ratio: 5.3 RATIO
Triglycerides: 138 mg/dL (ref <150)
VLDL: 28 mg/dL (ref 0–40)

## 2013-09-29 LAB — CREATININE, SERUM
Creatinine, Ser: 1.02 mg/dL (ref 0.50–1.10)
GFR calc Af Amer: 60 mL/min — ABNORMAL LOW (ref >90)
GFR calc non Af Amer: 52 mL/min — ABNORMAL LOW (ref >90)

## 2013-09-29 LAB — TSH: TSH: 1.528 u[IU]/mL (ref 0.350–4.500)

## 2013-09-29 LAB — PROTIME-INR
INR: 1.05 (ref 0.00–1.49)
Prothrombin Time: 13.5 seconds (ref 11.6–15.2)

## 2013-09-29 LAB — D-DIMER, QUANTITATIVE: D-Dimer, Quant: 0.4 ug/mL-FEU (ref 0.00–0.48)

## 2013-09-29 SURGERY — LEFT HEART CATHETERIZATION WITH CORONARY ANGIOGRAM
Anesthesia: LOCAL

## 2013-09-29 MED ORDER — ASPIRIN EC 81 MG PO TBEC
81.0000 mg | DELAYED_RELEASE_TABLET | Freq: Every day | ORAL | Status: DC
  Administered 2013-09-30: 81 mg via ORAL
  Filled 2013-09-29: qty 1

## 2013-09-29 MED ORDER — HEPARIN SODIUM (PORCINE) 5000 UNIT/ML IJ SOLN
5000.0000 [IU] | Freq: Three times a day (TID) | INTRAMUSCULAR | Status: DC
  Filled 2013-09-29 (×3): qty 1

## 2013-09-29 MED ORDER — HEPARIN SODIUM (PORCINE) 5000 UNIT/ML IJ SOLN
5000.0000 [IU] | Freq: Three times a day (TID) | INTRAMUSCULAR | Status: DC
  Administered 2013-09-29 – 2013-09-30 (×2): 5000 [IU] via SUBCUTANEOUS
  Filled 2013-09-29 (×5): qty 1

## 2013-09-29 MED ORDER — NITROGLYCERIN 0.2 MG/ML ON CALL CATH LAB
INTRAVENOUS | Status: AC
  Filled 2013-09-29: qty 1

## 2013-09-29 MED ORDER — ATORVASTATIN CALCIUM 80 MG PO TABS
80.0000 mg | ORAL_TABLET | Freq: Every day | ORAL | Status: DC
  Filled 2013-09-29: qty 1

## 2013-09-29 MED ORDER — FENTANYL CITRATE 0.05 MG/ML IJ SOLN
INTRAMUSCULAR | Status: AC
  Filled 2013-09-29: qty 2

## 2013-09-29 MED ORDER — SODIUM CHLORIDE 0.9 % IV SOLN: INTRAVENOUS | Status: AC

## 2013-09-29 MED ORDER — VERAPAMIL HCL 2.5 MG/ML IV SOLN
INTRAVENOUS | Status: AC
  Filled 2013-09-29: qty 2

## 2013-09-29 MED ORDER — MIDAZOLAM HCL 2 MG/2ML IJ SOLN
INTRAMUSCULAR | Status: AC
Start: 2013-09-29 — End: 2013-09-29
  Filled 2013-09-29: qty 2

## 2013-09-29 MED ORDER — HEPARIN SODIUM (PORCINE) 1000 UNIT/ML IJ SOLN
INTRAMUSCULAR | Status: AC
  Filled 2013-09-29: qty 1

## 2013-09-29 MED ORDER — ATORVASTATIN CALCIUM 40 MG PO TABS
40.0000 mg | ORAL_TABLET | Freq: Every day | ORAL | Status: DC
Start: 2013-09-29 — End: 2013-09-30
  Filled 2013-09-29 (×2): qty 1

## 2013-09-29 MED ORDER — HEPARIN (PORCINE) IN NACL 2-0.9 UNIT/ML-% IJ SOLN
INTRAMUSCULAR | Status: AC
Start: 2013-09-29 — End: 2013-09-29
  Filled 2013-09-29: qty 1000

## 2013-09-29 MED ORDER — LIDOCAINE HCL (PF) 1 % IJ SOLN
INTRAMUSCULAR | Status: AC
  Filled 2013-09-29: qty 30

## 2013-09-29 NOTE — CV Procedure (Signed)
    Cardiac Catheterization Procedure Note  Name: Crystal Day MRN: [id number] DOB: Jun 08, 1935  Procedure: Left Heart Cath, Selective Coronary Angiography, LV angiography  Indication: 79 yo WF with history of HTN presents with chest pain and elevated cardiac enzymes.   Procedural Details: The right wrist was prepped, draped, and anesthetized with 1% lidocaine. Using the modified Seldinger technique, a 5 French sheath was introduced into the right radial artery. 3 mg of verapamil was administered through the sheath, weight-based unfractionated heparin was administered intravenously. Standard Judkins catheters were used for selective coronary angiography and left ventriculography. Catheter exchanges were performed over an exchange length guidewire. There were no immediate procedural complications. A TR band was used for radial hemostasis at the completion of the procedure.  The patient was transferred to the post catheterization recovery area for further monitoring.  Procedural Findings: Hemodynamics: AO 117/64 mean 94 mm Hg LV 113/12 mm Hg  Coronary angiography: Coronary dominance: right  Left mainstem: Normal  Left anterior descending (LAD): Large vessel with mild calcification. Mild disease in the mid vessel less than 20%  Ramus intermediate: 20-30% proximal.  Left circumflex (LCx): Normal  Right coronary artery (RCA): Large dominant vessel. Normal  Left ventriculography: Left ventricular systolic function is vigorous, LVEF is estimated at 65-70%, there is no significant mitral regurgitation   Final Conclusions:   1. Minor nonobstructive CAD 2. Normal LV function.  Recommendations: Medical management with ASA, beta blocker, statin. Will ambulate today. If stable anticipate DC in am.  [name] Martinique MD,FACC  09/29/2013, 9:51 AM

## 2013-09-29 NOTE — Progress Notes (Signed)
ANTICOAGULATION CONSULT NOTE - Follow Up Consult  Pharmacy Consult for heparin Indication: chest pain/ACS  Labs:  Recent Labs  09/28/13 1519 09/28/13 1530 09/28/13 2225 09/29/13 0401  HGB 14.9 15.3*  --  13.1  HCT 42.9 45.0  --  39.5  PLT 185  --   --  160  LABPROT  --   --   --  13.5  INR  --   --   --  1.05  HEPARINUNFRC  --   --   --  0.87*  CREATININE 1.18* 1.40*  --   --   TROPONINI  --   --  4.64*  --     Assessment: 79yo female supratherapeutic on heparin with initial dosing for CP.  Goal of Therapy:  Heparin level 0.3-0.7 units/ml   Plan:  Will decrease heparin gtt by 2 units/kg/hr to 950 units/hr and check level in [place], PharmD, BCPS  09/29/2013,4:43 AM

## 2013-09-29 NOTE — Progress Notes (Addendum)
    Subjective:  Feels well today. She tolerated her heart catheterization well. No chest pain or shortness of breath.  Objective:  Vital Signs in the last 24 hours: Temp:  [97.2 F (36.2 C)-98.6 F (37 C)] 98.4 F (36.9 C) (02/13 1214) Pulse Rate:  [66-90] 67 (02/13 1214) Resp:  [13-21] 18 (02/13 1214) BP: (108-218)/(46-126) 132/71 mmHg (02/13 1300) SpO2:  [95 %-100 %] 97 % (02/13 1214) Weight:  [154 lb 5.2 oz (70 kg)-155 lb 3.3 oz (70.4 kg)] 154 lb 5.2 oz (70 kg) (02/13 0000)  Intake/Output from previous day: 02/12 0701 - 02/13 0700 In: 652 [P.O.:240; I.V.:412] Out: 1275 [Urine:1275]  Physical Exam: Pt is alert and oriented, NAD HEENT: normal Neck: JVP - normal, carotids 2+= without bruits Lungs: CTA bilaterally CV: RRR without murmur or gallop Abd: soft, NT, Positive BS, no hepatomegaly Ext: Right radial site clear, distal pulses intact and equal Skin: warm/dry no rash   Lab Results:  Recent Labs  09/28/13 1519 09/28/13 1530 09/29/13 0401  WBC 7.7  --  6.1  HGB 14.9 15.3* 13.1  PLT 185  --  160    Recent Labs  09/28/13 1519 09/28/13 1530 09/29/13 0401  NA 143 142 143  K 4.0 3.7 4.3  CL 100 100 104  CO2 27  --  27  GLUCOSE 164* 166* 119*  BUN 19 19 20   CREATININE 1.18* 1.40* 1.17*    Recent Labs  09/29/13 0401 09/29/13 0840  TROPONINI 5.38* 3.14*   Assessment/Plan:  1. Non-STEMI 2. Nonobstructive CAD and normal LV function 3. Hyperglycemia (hemoglobin A1c 5.9) 4. Hyperlipidemia  I have reviewed her cardiac catheterization films and agree there is no clear culprit for her non-ST elevation infarction. She really does not have symptoms suggestive of pulmonary embolus, but I think with her significant troponin elevation we should at least check a d-dimer. If this is positive would do a CT angiogram. Otherwise would treat medically. A 2-D echocardiogram is pending. Likely discharge from the hospital tomorrow morning unless her echo or d-dimer are  abnormal. Lipids are markedly elevated and she is on a statin drug now.  Sherren Mocha, M.D. 09/29/2013, 1:31 PM    2

## 2013-09-29 NOTE — Interval H&P Note (Signed)
History and Physical Interval Note:  09/29/2013 9:23 AM  Crystal Day  has presented today for surgery, with the diagnosis of cp  The various methods of treatment have been discussed with the patient and family. After consideration of risks, benefits and other options for treatment, the patient has consented to  Procedure(s): LEFT HEART CATHETERIZATION WITH CORONARY ANGIOGRAM (N/A) as a surgical intervention .  The patient's history has been reviewed, patient examined, no change in status, stable for surgery.  I have reviewed the patient's chart and labs.  Questions were answered to the patient's satisfaction.   Cath Lab Visit (complete for each Cath Lab visit)  Clinical Evaluation Leading to the Procedure:   ACS: yes  Non-ACS:    Anginal Classification: CCS IV  Anti-ischemic medical therapy: Minimal Therapy (1 class of medications)  Non-Invasive Test Results: No non-invasive testing performed  Prior CABG: No previous CABG        Collier Salina [facility] 09/29/2013 9:23 AM

## 2013-09-29 NOTE — Progress Notes (Signed)
Utilization review completed. Loren Sawaya, RN, BSN. 

## 2013-09-29 NOTE — Progress Notes (Signed)
TR band removed without complications.  2x2 and opsite applied.  Radial site care and restrictions discussed with patient.  Right radial site level 0.  Sanda Linger

## 2013-09-30 DIAGNOSIS — N179 Acute kidney failure, unspecified: Secondary | ICD-10-CM

## 2013-09-30 DIAGNOSIS — I214 Non-ST elevation (NSTEMI) myocardial infarction: Secondary | ICD-10-CM

## 2013-09-30 MED ORDER — METOPROLOL TARTRATE 25 MG PO TABS: 25.0000 mg | ORAL_TABLET | Freq: Two times a day (BID) | ORAL | Status: DC

## 2013-09-30 MED ORDER — ATORVASTATIN CALCIUM 40 MG PO TABS: 40.0000 mg | ORAL_TABLET | Freq: Every day | ORAL | Status: DC

## 2013-09-30 MED ORDER — ASPIRIN 81 MG PO TBEC: 81.0000 mg | DELAYED_RELEASE_TABLET | Freq: Every day | ORAL | Status: DC

## 2013-09-30 MED ORDER — VALSARTAN-HYDROCHLOROTHIAZIDE 160-12.5 MG PO TABS: 1.0000 | ORAL_TABLET | Freq: Every day | ORAL | Status: DC

## 2013-09-30 NOTE — Discharge Summary (Signed)
Physician Discharge Summary  Patient ID: Crystal Day MRN: [id number] DOB/AGE: 05/16/1935 79 y.o.  Cardiologist:  Crystal Day(new)  Admit date: 09/28/2013 Discharge date: 09/30/2013  Admission Diagnoses: Chest pain  Discharge Diagnoses:  Principal Problem:   Chest pain Active Problems:   HTN (hypertension)   Acute renal failure   NSTEMI (non-ST elevated myocardial infarction)   Discharged Condition: stable  Hospital Course:  The patient is a very active 79 yo female with a history of HTN, for which she takes losartan, heart murmur, and indigestion. She presents with CP which had a sudden onset while walking to the mailbox. This was right after eating a large lunch. She had walked two miles earlier in the day and did senior aerobics without incident. The pain was sharp, 10/10 with no SOB, diaphoresis or radiation. She did become nauseated after arriving in the ER. For the last year she has been having chest pain, only at night, which wakes her from sleep and last 2-60 seconds. BP was elevated at 218/81 and has improved on IV nitroglycerin.   She was admitted and ruled in for NSTEMI.  Troponin peaked at 5.38. She was started on IV NTG initially for hypertension which improved.  Lopressor added at 25mg  bid.  She was taken to the cath lab which revealed minor nonobstructive CAD and normal LVF.  Lipids are significantly elevated.  Lipitor added.  She continued without further CP.  Diovan/HCT will be reduced to 160/12.5 at discharge.  The patient was seen by Dr. Jacinta Day who felt she was stable for DC home.  Follow up with APP in the office.  OP echo.     Consults:None  Significant Diagnostic Studies:  Left heart cath  Procedural Findings:  Hemodynamics:  AO 117/64 mean 94 mm Hg  LV 113/12 mm Hg  Coronary angiography:  Coronary dominance: right  Left mainstem: Normal  Left anterior descending (LAD): Large vessel with mild calcification. Mild disease in the mid vessel less than 20%   Ramus intermediate: 20-30% proximal.  Left circumflex (LCx): Normal  Right coronary artery (RCA): Large dominant vessel. Normal  Left ventriculography: Left ventricular systolic function is vigorous, LVEF is estimated at 65-70%, there is no significant mitral regurgitation  Final Conclusions:  1. Minor nonobstructive CAD  2. Normal LV function.  Recommendations: Medical management with ASA, beta blocker, statin. Will ambulate today. If stable anticipate DC in am.  Crystal Martinique MD,FACC  09/29/2013, 9:51 AM   Lipid Panel     Component Value Date/Time   CHOL 221* 09/29/2013 0401   TRIG 138 09/29/2013 0401   HDL 42 09/29/2013 0401   CHOLHDL 5.3 09/29/2013 0401   VLDL 28 09/29/2013 0401   LDLCALC 151* 09/29/2013 0401   Treatments: See above  Discharge Exam: Blood pressure 134/73, pulse 71, temperature 98 F (36.7 C), temperature source Oral, resp. rate 18, height 5\' 9"  (1.753 m), weight 154 lb 1.6 oz (69.899 kg), SpO2 97.00%.   Disposition:       Discharge Orders   Future Orders Complete By Expires   Diet - low sodium heart healthy  As directed    Discharge instructions  As directed    Comments:     No lifting with right arm for two more days.   Increase activity slowly  As directed        Medication List    STOP taking these medications       valsartan-hydrochlorothiazide 320-25 MG per tablet  Commonly known as:  DIOVAN-HCT  Replaced  by:  valsartan-hydrochlorothiazide 160-12.5 MG per tablet      TAKE these medications       aspirin 81 MG EC tablet  Take 1 tablet (81 mg total) by mouth daily.     atorvastatin 40 MG tablet  Commonly known as:  LIPITOR  Take 1 tablet (40 mg total) by mouth daily at 6 PM.     metoprolol tartrate 25 MG tablet  Commonly known as:  LOPRESSOR  Take 1 tablet (25 mg total) by mouth 2 (two) times daily.     valsartan-hydrochlorothiazide 160-12.5 MG per tablet  Commonly known as:  DIOVAN HCT  Take 1 tablet by mouth daily.        Follow-up Information   Follow up with Crystal Kage, PA-C. (Your appointment will be Thursday or Friday next week.  Our office scheduler will call you with the appt date and time on Monday.)    Specialty:  Physician Assistant   Contact information:   [address] [place] [address] [phone]       Signed: Tarri Day 09/30/2013, 9:01 AM

## 2013-09-30 NOTE — Progress Notes (Signed)
The patient was seen and examined, and I agree with the assessment and plan as documented above. Pt denies chest pain, shortness of breath, palpitations, dizziness, and orthopnea. Remains active and walks and dances regularly. Physical exam is normal. Will schedule early f/u in the cardiology clinic this week. Echo to be done as outpatient. LV gram revealed normal LV systolic function. Discharge to home today.

## 2013-09-30 NOTE — Progress Notes (Signed)
Subjective: No further CP  Objective: Vital signs in last 24 hours: Temp:  [98 F (36.7 C)-98.6 F (37 C)] 98 F (36.7 C) (02/14 0452) Pulse Rate:  [67-85] 71 (02/14 0452) Resp:  [17-18] 18 (02/14 0452) BP: (117-162)/(53-73) 134/73 mmHg (02/14 0452) SpO2:  [96 %-97 %] 97 % (02/14 0452) Weight:  [154 lb 1.6 oz (69.899 kg)] 154 lb 1.6 oz (69.899 kg) (02/14 0452) Last BM Date: 09/28/13  Intake/Output from previous day: 02/13 0701 - 02/14 0700 In: 35.5 [I.V.:35.5] Out: 450 [Urine:450] Intake/Output this shift:    Medications Current Facility-Administered Medications  Medication Dose Route Frequency Provider Last Rate Last Dose  . aspirin EC tablet 81 mg  81 mg Oral Daily Lorretta Harp, MD      . atorvastatin (LIPITOR) tablet 40 mg  40 mg Oral q1800 Peter M Martinique, MD      . heparin injection 5,000 Units  5,000 Units Subcutaneous 3 times per day Lorretta Harp, MD   5,000 Units at 09/30/13 [phone]  . metoprolol tartrate (LOPRESSOR) tablet 25 mg  25 mg Oral BID Tarri Fuller, PA-C   25 mg at 09/29/13 2122  . nitroGLYCERIN (NITROSTAT) SL tablet 0.4 mg  0.4 mg Sublingual Q5 Min x 3 PRN Tarri Fuller, PA-C        PE: General appearance: alert, cooperative and no distress Lungs: clear to auscultation bilaterally Heart: regular rate and rhythm, S1, S2 normal, no murmur, click, rub or gallop Extremities: No LEE Pulses: 2+ and symmetric Skin: Warm and dry Neurologic: Grossly normal  Lab Results:   Recent Labs  09/28/13 1519 09/28/13 1530 09/29/13 0401 09/29/13 1245  WBC 7.7  --  6.1 5.7  HGB 14.9 15.3* 13.1 13.4  HCT 42.9 45.0 39.5 40.5  PLT 185  --  160 148*   BMET  Recent Labs  09/28/13 1519 09/28/13 1530 09/29/13 0401 09/29/13 1245  NA 143 142 143  --   K 4.0 3.7 4.3  --   CL 100 100 104  --   CO2 27  --  27  --   GLUCOSE 164* 166* 119*  --   BUN 19 19 20   --   CREATININE 1.18* 1.40* 1.17* 1.02  CALCIUM 9.7  --  9.1  --    PT/INR  Recent Labs  09/29/13 0401  LABPROT 13.5  INR 1.05   Cholesterol  Recent Labs  09/29/13 0401  CHOL 221*   Lipid Panel     Component Value Date/Time   CHOL 221* 09/29/2013 0401   TRIG 138 09/29/2013 0401   HDL 42 09/29/2013 0401   CHOLHDL 5.3 09/29/2013 0401   VLDL 28 09/29/2013 0401   LDLCALC 151* 09/29/2013 0401    Cardiac Panel (last 3 results)  Recent Labs  09/28/13 2225 09/29/13 0401 09/29/13 0840  TROPONINI 4.64* 5.38* 3.14*    Assessment/Plan  Principal Problem:   Chest pain Active Problems:   HTN (hypertension)   Acute renal failure   NSTEMI (non-ST elevated myocardial infarction)  Plan:  SP LHC revealing minor nonobstructive CAD despite peak troponin of 5.38.  LVEF 65-70%.  Continue ASA, BB, statin.    LDL is significantly elevated.  D dimer WNL .  2D echo pending but can be down in the office if not this morning.  Scr 1.04.   Will also continue diovan HCT at lower dose at discharge(was on 320/25)  DC home today.     LOS: 2 days  Crystal Day 09/30/2013 7:40 AM

## 2013-10-05 ENCOUNTER — Encounter: Payer: Self-pay | Admitting: Physician Assistant

## 2013-10-05 ENCOUNTER — Ambulatory Visit (INDEPENDENT_AMBULATORY_CARE_PROVIDER_SITE_OTHER): Payer: Medicare Other | Admitting: Physician Assistant

## 2013-10-05 VITALS — BP 150/84 | HR 72 | Ht 69.0 in | Wt 160.0 lb

## 2013-10-05 DIAGNOSIS — I10 Essential (primary) hypertension: Secondary | ICD-10-CM

## 2013-10-05 DIAGNOSIS — I2510 Atherosclerotic heart disease of native coronary artery without angina pectoris: Secondary | ICD-10-CM | POA: Insufficient documentation

## 2013-10-05 MED ORDER — NITROGLYCERIN 0.4 MG SL SUBL: 0.4000 mg | SUBLINGUAL_TABLET | SUBLINGUAL | Status: DC | PRN

## 2013-10-05 NOTE — Assessment & Plan Note (Signed)
The patient had no further episodes of chest pain. We'll continue her on Lopressor 25 mg twice daily. I also sent some nitroglycerin should she have further episodes.  Maybe that she did have some vasospasm. We'll need to consider amlodipine should she have further episodes.

## 2013-10-05 NOTE — Patient Instructions (Signed)
1.  Start new dose of Diovan/HCT. 2.  Echocardiogram  3.  Follow up in six months with Dr. Gwenlyn Found or sooner if needed.

## 2013-10-05 NOTE — Assessment & Plan Note (Signed)
Blood pressure appears to be elevated still would increase her Diovan HCT back to her previous dosing which was 320/25 mg.

## 2013-10-05 NOTE — Progress Notes (Signed)
Date:  10/05/2013   ID:  Crystal Day, DOB April 11, 1935, MRN [id number]  PCP:  Crystal Naas, MD  Primary Cardiologist:  Crystal Day     History of Present Illness: Crystal Day is a 79 y.o. female with a history of HTN, for which she takes losartan, heart murmur, and indigestion. She presents with CP which had a sudden onset while walking to the mailbox. This was right after eating a large lunch. She had walked two miles earlier in the day and did senior aerobics without incident. The pain was sharp, 10/10 with no SOB, diaphoresis or radiation. She did become nauseated after arriving in the ER. For the last year she has been having chest pain, only at night, which wakes her from sleep and last 2-60 seconds. BP was elevated at 218/81 and has improved on IV nitroglycerin.   She was admitted and ruled in for NSTEMI. Troponin peaked at 5.38. She was started on IV NTG initially for hypertension which improved. Lopressor added at 25mg  bid. She was taken to the cath lab which revealed minor nonobstructive CAD and normal LVF. Lipids are significantly elevated. Lipitor added. She continued without further CP. Diovan/HCT will be reduced to 160/12.5 at discharge.   Patient presents today for posthospital evaluation. She reports doing quite well she did mild to of walking and then went to aerobics. She had no complaints during these activities, which she usually does multiple times per week.  The patient currently denies nausea, vomiting, fever, chest pain, shortness of breath, orthopnea, dizziness, PND, cough, congestion, abdominal pain, hematochezia, melena, lower extremity edema, claudication.  Wt Readings from Last 3 Encounters:  10/05/13 160 lb (72.576 kg)  09/30/13 154 lb 1.6 oz (69.899 kg)  09/30/13 154 lb 1.6 oz (69.899 kg)     Past Medical History  Diagnosis Date  . Hypertension   . Stroke 2009    residuals include numbness on right side of lips and right finger tips  . Anginal pain  since 2014  . Pneumonia     "in my 20's"  . Headache(784.0)     "had migraines for 20 years, not anymore"  . Cancer 1988 or 89    basal cell carcinoma on face s/p Mohs procedure  . Arthritis     just in the hands    Current Outpatient Prescriptions  Medication Sig Dispense Refill  . aspirin EC 81 MG EC tablet Take 1 tablet (81 mg total) by mouth daily.      . metoprolol tartrate (LOPRESSOR) 25 MG tablet Take 1 tablet (25 mg total) by mouth 2 (two) times daily.  60 tablet  5  . nitroGLYCERIN (NITROSTAT) 0.4 MG SL tablet Place 1 tablet (0.4 mg total) under the tongue every 5 (five) minutes as needed for chest pain.  25 tablet  3   No current facility-administered medications for this visit.    Allergies:    Allergies  Allergen Reactions  . Penicillins Hives  . Tetracyclines & Related Nausea Only  . Elavil [Amitriptyline] Rash  . Erythromycin Rash  . Macrodantin [Nitrofurantoin Macrocrystal] Rash    Social History:  The patient  reports that she quit smoking about 32 years ago. She does not have any smokeless tobacco history on file. She reports that she does not drink alcohol or use illicit drugs.   Family history:   Family History  Problem Relation Age of Onset  . Heart attack Father 33  . Cancer Mother   . Stroke Brother   .  Stroke Paternal Grandmother   . Stroke Paternal Grandfather   . Heart attack Maternal Grandfather     ROS:  Please see the history of present illness.  All other systems reviewed and negative.   PHYSICAL EXAM: VS:  BP 150/84  Pulse 72  Ht 5\' 9"  (1.753 m)  Wt 160 lb (72.576 kg)  BMI 23.62 kg/m2 Well nourished, well developed, in no acute distress HEENT: Pupils are equal round react to light accommodation extraocular movements are intact.  Neck: no JVDNo cervical lymphadenopathy. Cardiac: Regular rate and rhythm without murmurs rubs or gallops. Lungs:  clear to auscultation bilaterally, no wheezing, rhonchi or rales Ext: no lower extremity  edema.  2+ radial and dorsalis pedis pulses. Skin: warm and dry Neuro:  Grossly normal    ASSESSMENT AND PLAN:  Problem List Items Addressed This Visit   HTN (hypertension)     Blood pressure appears to be elevated still would increase her Diovan HCT back to her previous dosing which was 320/25 mg.    Relevant Medications      nitroGLYCERIN (NITROSTAT) SL tablet   CAD (coronary artery disease): Non-obstructive by cath - Primary     The patient had no further episodes of chest pain. We'll continue her on Lopressor 25 mg twice daily. I also sent some nitroglycerin should she have further episodes.  Maybe that she did have some vasospasm. We'll need to consider amlodipine should she have further episodes.    Relevant Orders      2D Echocardiogram without contrast

## 2013-10-06 ENCOUNTER — Ambulatory Visit: Payer: Medicare Other | Admitting: Physician Assistant

## 2013-10-06 ENCOUNTER — Other Ambulatory Visit: Payer: Self-pay | Admitting: Physician Assistant

## 2013-10-06 MED ORDER — VALSARTAN-HYDROCHLOROTHIAZIDE 320-25 MG PO TABS: 1.0000 | ORAL_TABLET | Freq: Every day | ORAL | Status: DC

## 2013-10-16 ENCOUNTER — Telehealth: Payer: Self-pay | Admitting: *Deleted

## 2013-10-16 ENCOUNTER — Ambulatory Visit (HOSPITAL_COMMUNITY)
Admission: RE | Admit: 2013-10-16 | Discharge: 2013-10-16 | Disposition: A | Discharge status: Home / Self Care | Payer: Medicare Other | Source: Ambulatory Visit | Attending: Internal Medicine | Admitting: Internal Medicine

## 2013-10-16 DIAGNOSIS — I2510 Atherosclerotic heart disease of native coronary artery without angina pectoris: Secondary | ICD-10-CM

## 2013-10-16 DIAGNOSIS — I369 Nonrheumatic tricuspid valve disorder, unspecified: Secondary | ICD-10-CM

## 2013-10-16 NOTE — Telephone Encounter (Signed)
Returning your call. °

## 2013-10-16 NOTE — Telephone Encounter (Signed)
Walk-In Message received to call pt.  "Would like to know what the medicine given to me at the hospital when discharged 09/30/13 (metoprolol 25 mg) Is it to slow the heart down or what.  I feel a little tired and have never felt this way I'm just concerned."   Returned call.  Left message to call back before 4pm. (Cell and Home)

## 2013-10-16 NOTE — Progress Notes (Signed)
2D Echo Performed 10/16/2013    Hondo Nanda, RCS  

## 2013-10-16 NOTE — Telephone Encounter (Signed)
Returned call. Left message to call back.

## 2013-10-17 NOTE — Telephone Encounter (Signed)
Returned call and pt verified x 2.  Pt stated she wanted to know what metoprolol was for.  Stated she doesn't remember that being explained.  Pt informed metoprolol is a BP/HR control med.  Informed it is a beta blocker and opens the blood vessels.  Pt informed her BP was a little elevated at discharge and when she came in the office on 2.19.15.  Pt c/o feeling tired and stated she isn't used to that.  Stated she is a very active person and exercises, but the feels tired w/ this med.  Home BPs: 130s-140s/70s HR 60s-70s.  Pt informed BP is still slightly elevated and advised she continue taking all meds as directed.  Pt informed it may take her body a few weeks to adjust to the lower BPs if it had been in 150s+ and dehydration can cause her to feel that way while taking BP meds as well.    Advice  Continue taking meds  Come in for BP check next week  Increase water intake  Call back for sooner appt if symptoms intolerable  Pt verbalized understanding and agreed w/ plan.  Pt declined to schedule appt at this time.  Stated she will continue meds and call back for an appt next week w/ Erasmo Downer, PharmD.  Pt also agreed to bring in BP cuff to appointment.    Pt also requesting echo results and informed Tarri Fuller, PA-C/JC, LPN will be notified.

## 2013-10-24 ENCOUNTER — Telehealth: Payer: Self-pay | Admitting: Physician Assistant

## 2013-10-24 NOTE — Telephone Encounter (Signed)
Would like echo results from 10-16-13 please.

## 2013-10-25 ENCOUNTER — Other Ambulatory Visit: Payer: Self-pay | Admitting: *Deleted

## 2013-10-25 NOTE — Telephone Encounter (Signed)
Mild to moderate regurgitation but otherwise normal.  EF 60-65%  Tamarcus Condie PAC

## 2013-10-25 NOTE — Telephone Encounter (Signed)
Patient notified of results.

## 2013-10-25 NOTE — Telephone Encounter (Signed)
Please advise 

## 2013-10-25 NOTE — Telephone Encounter (Signed)
Will defer to Crystal Day to contact patient once echo results have been reviewed

## 2013-11-14 ENCOUNTER — Telehealth: Payer: Self-pay | Admitting: Physician Assistant

## 2013-11-14 NOTE — Telephone Encounter (Signed)
Please call-she need clarence for knee surgery. DrWainer's  office said the had faxed over a clarence,but had not heard back from it.

## 2013-11-14 NOTE — Telephone Encounter (Signed)
Forward to Dr Cecil Cobbs RN  cardiac clearance is needed, last office visit with Tarri Fuller, next recall is in 03/2014

## 2013-11-15 NOTE — Telephone Encounter (Signed)
Dr Gwenlyn Found gave clearance and form was faxed in to [facility].  Patient aware.

## 2014-02-12 ENCOUNTER — Other Ambulatory Visit: Payer: Self-pay

## 2014-02-12 DIAGNOSIS — Z1231 Encounter for screening mammogram for malignant neoplasm of breast: Secondary | ICD-10-CM

## 2014-02-22 ENCOUNTER — Ambulatory Visit
Admission: RE | Admit: 2014-02-22 | Discharge: 2014-02-22 | Disposition: A | Discharge status: Home / Self Care | Payer: Medicare Other | Source: Ambulatory Visit

## 2014-02-22 DIAGNOSIS — Z1231 Encounter for screening mammogram for malignant neoplasm of breast: Secondary | ICD-10-CM

## 2014-04-17 ENCOUNTER — Encounter: Payer: Self-pay | Admitting: Cardiovascular Disease

## 2014-04-17 ENCOUNTER — Ambulatory Visit (INDEPENDENT_AMBULATORY_CARE_PROVIDER_SITE_OTHER): Payer: Medicare Other | Admitting: Cardiovascular Disease

## 2014-04-17 VITALS — BP 163/81 | HR 70 | Ht 69.0 in | Wt 160.0 lb

## 2014-04-17 DIAGNOSIS — I214 Non-ST elevation (NSTEMI) myocardial infarction: Secondary | ICD-10-CM

## 2014-04-17 DIAGNOSIS — I10 Essential (primary) hypertension: Secondary | ICD-10-CM

## 2014-04-17 DIAGNOSIS — E785 Hyperlipidemia, unspecified: Secondary | ICD-10-CM

## 2014-04-17 DIAGNOSIS — I2510 Atherosclerotic heart disease of native coronary artery without angina pectoris: Secondary | ICD-10-CM

## 2014-04-17 NOTE — Assessment & Plan Note (Signed)
Followed by her PCP. Currently not on a statin drug. Apparently she had arthralgias when she was on Lipitor in the past

## 2014-04-17 NOTE — Patient Instructions (Signed)
Your physician recommends that you schedule a follow-up appointment in: 6 Months with Lurena Joiner or [facility] and 12 Months with Dr Gwenlyn Found

## 2014-04-17 NOTE — Assessment & Plan Note (Signed)
His blood pressure is mildly elevated today but she said this morning when she checked it it was normal on appropriate medications.

## 2014-04-17 NOTE — Progress Notes (Signed)
04/17/2014 ZAKARIA FROMER   29-Dec-1934  [id number]  Primary Physician Reginia Naas, MD Primary Cardiologist: Lorretta Harp MD Renae Gloss   HPI:  [name] is a 79 year old mildly overweight widowed Caucasian female mother of 2 children, grandmother for grandchildren, patient of Dr. Carol Ada. She has a history of hypertension and hyperlipidemia. She was [facility] in February this year for chest pain and non-STEMI. She ultimately underwent cardiac catheterization by Dr. Peter Martinique revealing nonobstructive CAD and medical therapy was recommended. She has had no recurrent symptoms.   Current Outpatient Prescriptions  Medication Sig Dispense Refill  . aspirin EC 81 MG EC tablet Take 1 tablet (81 mg total) by mouth daily.      . metoprolol tartrate (LOPRESSOR) 25 MG tablet Take 1 tablet (25 mg total) by mouth 2 (two) times daily.  60 tablet  5  . nitroGLYCERIN (NITROSTAT) 0.4 MG SL tablet Place 1 tablet (0.4 mg total) under the tongue every 5 (five) minutes as needed for chest pain.  25 tablet  3  . valsartan-hydrochlorothiazide (DIOVAN HCT) 320-25 MG per tablet Take 1 tablet by mouth daily.  30 tablet  5   No current facility-administered medications for this visit.    Allergies  Allergen Reactions  . Penicillins Hives  . Tetracyclines & Related Nausea Only  . Elavil [Amitriptyline] Rash  . Erythromycin Rash  . Macrodantin [Nitrofurantoin Macrocrystal] Rash    History   Social History  . Marital Status: Widowed    Spouse Name: N/A    Number of Children: N/A  . Years of Education: N/A   Occupational History  . Not on file.   Social History Main Topics  . Smoking status: Former Smoker -- 0.25 packs/day for 5 years    Quit date: 08/17/1981  . Smokeless tobacco: Not on file  . Alcohol Use: No  . Drug Use: No  . Sexual Activity: Not on file   Other Topics Concern  . Not on file   Social History Narrative  . No  narrative on file     Review of Systems: General: negative for chills, fever, night sweats or weight changes.  Cardiovascular: negative for chest pain, dyspnea on exertion, edema, orthopnea, palpitations, paroxysmal nocturnal dyspnea or shortness of breath Dermatological: negative for rash Respiratory: negative for cough or wheezing Urologic: negative for hematuria Abdominal: negative for nausea, vomiting, diarrhea, bright red blood per rectum, melena, or hematemesis Neurologic: negative for visual changes, syncope, or dizziness All other systems reviewed and are otherwise negative except as noted above.    Blood pressure 163/81, pulse 70, height 5\' 9"  (1.753 m), weight 160 lb (72.576 kg).  General appearance: alert and no distress Neck: no adenopathy, no carotid bruit, no JVD, supple, symmetrical, trachea midline and thyroid not enlarged, symmetric, no tenderness/mass/nodules Lungs: clear to auscultation bilaterally Heart: regular rate and rhythm, S1, S2 normal, no murmur, click, rub or gallop Extremities: extremities normal, atraumatic, no cyanosis or edema  EKG normal sinus rhythm at 70 with nonspecific ST and T wave changes.  ASSESSMENT AND PLAN:   NSTEMI (non-ST elevated myocardial infarction) Patient was admitted back in February with chest pain. Her troponins went to 5. She underwent cardiac catheterization by Dr. Peter Martinique revealing nonobstructive CAD. She has had no recurrent symptoms.  HTN (hypertension) His blood pressure is mildly elevated today but she said this morning when she checked it it was normal on appropriate medications.  Hyperlipidemia Followed by her  PCP. Currently not on a statin drug. Apparently she had arthralgias when she was on Lipitor in the past      Lorretta Harp MD [facility], [facility] 04/17/2014 10:13 AM

## 2014-04-17 NOTE — Assessment & Plan Note (Signed)
Patient was admitted back in February with chest pain. Her troponins went to 5. She underwent cardiac catheterization by Dr. Peter Martinique revealing nonobstructive CAD. She has had no recurrent symptoms.

## 2014-07-26 ENCOUNTER — Encounter (HOSPITAL_COMMUNITY): Payer: Self-pay | Admitting: Cardiology

## 2014-08-21 ENCOUNTER — Telehealth: Payer: Self-pay | Admitting: *Deleted

## 2014-08-21 NOTE — Telephone Encounter (Signed)
noncritical CAD at March 2015. Patient is cleared for her knee arthroplasty

## 2014-08-21 NOTE — Telephone Encounter (Signed)
Pt needing cardiac clearance for Knee Arthroplasty. Should be scheduling an appt with Gaspar Bidding in March 2016.

## 2014-08-22 ENCOUNTER — Other Ambulatory Visit: Payer: Self-pay | Admitting: Orthopedic Surgery

## 2014-08-22 DIAGNOSIS — M1711 Unilateral primary osteoarthritis, right knee: Secondary | ICD-10-CM

## 2014-08-27 ENCOUNTER — Ambulatory Visit
Admission: RE | Admit: 2014-08-27 | Discharge: 2014-08-27 | Disposition: A | Discharge status: Home / Self Care | Payer: Medicare Other | Source: Ambulatory Visit | Attending: Orthopedic Surgery | Admitting: Orthopedic Surgery

## 2014-08-27 DIAGNOSIS — M1711 Unilateral primary osteoarthritis, right knee: Secondary | ICD-10-CM

## 2014-08-31 NOTE — Telephone Encounter (Signed)
[place] faxed back clearance form

## 2014-09-28 ENCOUNTER — Telehealth: Payer: Self-pay | Admitting: Physician Assistant

## 2014-09-28 ENCOUNTER — Encounter: Payer: Self-pay | Admitting: Physician Assistant

## 2014-09-28 ENCOUNTER — Ambulatory Visit (INDEPENDENT_AMBULATORY_CARE_PROVIDER_SITE_OTHER): Payer: Medicare Other | Admitting: Physician Assistant

## 2014-09-28 ENCOUNTER — Ambulatory Visit
Admission: RE | Admit: 2014-09-28 | Discharge: 2014-09-28 | Disposition: A | Discharge status: Home / Self Care | Payer: Medicare Other | Source: Ambulatory Visit | Attending: Physician Assistant | Admitting: Physician Assistant

## 2014-09-28 VITALS — BP 146/62 | HR 73 | Ht 69.0 in | Wt 155.6 lb

## 2014-09-28 DIAGNOSIS — H5711 Ocular pain, right eye: Secondary | ICD-10-CM

## 2014-09-28 DIAGNOSIS — I2510 Atherosclerotic heart disease of native coronary artery without angina pectoris: Secondary | ICD-10-CM

## 2014-09-28 DIAGNOSIS — R55 Syncope and collapse: Secondary | ICD-10-CM

## 2014-09-28 DIAGNOSIS — I2583 Coronary atherosclerosis due to lipid rich plaque: Secondary | ICD-10-CM

## 2014-09-28 DIAGNOSIS — E785 Hyperlipidemia, unspecified: Secondary | ICD-10-CM | POA: Diagnosis not present

## 2014-09-28 NOTE — Assessment & Plan Note (Signed)
Syncopal episode last Thursday after turning suddenly. She's had residual right eye soreness since with no complaints of vision changes.  No episodes of dizziness or vertigo. Place a two-week CardioNet monitor and schedule CT scan of the head.

## 2014-09-28 NOTE — Assessment & Plan Note (Signed)
She was not on a statin. Her LDL last February was 151. She was tried on Lipitor and it made her bones ache she said. She is due to have her cholesterol checked next week with her primary care provider. But it continues to be elevated we should try her on another type of statin low-dose every other day if we need to.

## 2014-09-28 NOTE — Assessment & Plan Note (Signed)
She reports no chest pain and other than having the syncopal episode she feels very well.

## 2014-09-28 NOTE — Progress Notes (Signed)
Patient ID: Crystal Day, female   DOB: 1935-03-06, 80 y.o.   MRN: [id number]    Date:  09/28/2014   ID:  Crystal Day, DOB Sep 08, 1934, MRN [id number]  PCP:  Reginia Naas, MD  Primary Cardiologist:  Gwenlyn Found   Chief Complaint  Patient presents with  . Follow-up    cardiac clearance for knee surgery on 05/16. pt block out last thursday     History of Present Illness: Crystal Day is a 80 y.o. female  Crystal Day is a 81 year old mildly overweight widowed Caucasian female mother of 2 children, grandmother for grandchildren, patient of Dr. Carol Ada. She has a history of hypertension and hyperlipidemia. She was admitted to [facility] hospital in February 2015 for chest pain and non-STEMI. She ultimately underwent cardiac catheterization by Dr. Peter Martinique revealing nonobstructive CAD and medical therapy was recommended.   She presents today for six-month evaluation and preoperative clearance. She needs a right partial knee replacement with Dr. Wynelle Link on May 16.  Patient reports that she's been doing well however, last Thursday she was standing and turned suddenly and then blacked out for about 5-10 seconds. She said she was near her desk result in any injury. After that she felt weak and trembling for most of the day. She continues to have some right eye pain describing it as being "sore". She did have a stroke approximately 7 years ago with residual numbness in her lips and right fingers.  She has not had any further episodes of dizziness or neurological symptoms.  She also denies nausea, vomiting, fever, chest pain, shortness of breath, orthopnea, PND, cough, congestion, abdominal pain, hematochezia, melena, lower extremity edema, claudication.  Wt Readings from Last 3 Encounters:  09/28/14 155 lb 9.6 oz (70.58 kg)  04/17/14 160 lb (72.576 kg)  10/05/13 160 lb (72.576 kg)     Past Medical History  Diagnosis Date  . Hypertension   . Stroke 2009    residuals include numbness on  right side of lips and right finger tips  . Anginal pain since 2014  . Pneumonia     "in my 20's"  . Headache(784.0)     "had migraines for 20 years, not anymore"  . Cancer 1988 or 89    basal cell carcinoma on face s/p Mohs procedure  . Arthritis     just in the hands  . Non-STEMI (non-ST elevated myocardial infarction)   . Hyperlipidemia     Current Outpatient Prescriptions  Medication Sig Dispense Refill  . aspirin EC 81 MG EC tablet Take 1 tablet (81 mg total) by mouth daily.    . metoprolol tartrate (LOPRESSOR) 25 MG tablet Take 1 tablet (25 mg total) by mouth 2 (two) times daily. 60 tablet 5  . nitroGLYCERIN (NITROSTAT) 0.4 MG SL tablet Place 1 tablet (0.4 mg total) under the tongue every 5 (five) minutes as needed for chest pain. 25 tablet 3  . valsartan-hydrochlorothiazide (DIOVAN HCT) 320-25 MG per tablet Take 1 tablet by mouth daily. 30 tablet 5   No current facility-administered medications for this visit.    Allergies:    Allergies  Allergen Reactions  . Penicillins Hives  . Tetracyclines & Related Nausea Only  . Elavil [Amitriptyline] Rash  . Erythromycin Rash  . Macrodantin [Nitrofurantoin Macrocrystal] Rash    Social History:  The patient  reports that she quit smoking about 33 years ago. She does not have any smokeless tobacco history on file. She reports that she does not  drink alcohol or use illicit drugs.   Family history:   Family History  Problem Relation Age of Onset  . Heart attack Father 61  . Cancer Mother   . Stroke Brother   . Stroke Paternal Grandmother   . Stroke Paternal Grandfather   . Heart attack Maternal Grandfather     ROS:  Please see the history of present illness.  All other systems reviewed and negative.   PHYSICAL EXAM: VS:  BP 146/62 mmHg  Pulse 73  Ht 5\' 9"  (1.753 m)  Wt 155 lb 9.6 oz (70.58 kg)  BMI 22.97 kg/m2 Well nourished, well developed, in no acute distress HEENT: Pupils are equal round react to light  accommodation extraocular movements are intact.  Neck: no JVDNo cervical lymphadenopathy. Cardiac: Regular rate and rhythm without murmurs rubs or gallops. Lungs:  clear to auscultation bilaterally, no wheezing, rhonchi or rales Abd: soft, nontender, positive bowel sounds all quadrants, no hepatosplenomegaly Ext: no lower extremity edema.  2+ radial and dorsalis pedis pulses. Skin: warm and dry Neuro:  CN 2-12 in tacked.  Grossly normal  EKG:  Normal sinus rhythm with a rate of 73 bpm.  ASSESSMENT AND PLAN:  Problem List Items Addressed This Visit    Syncope - Primary    Syncopal episode last Thursday after turning suddenly. She's had residual right eye soreness since with no complaints of vision changes.  No episodes of dizziness or vertigo. Place a two-week CardioNet monitor and schedule CT scan of the head.      Relevant Orders   EKG 12-Lead   CT Head Wo Contrast   Cardiac event monitor   Hyperlipidemia    She was not on a statin. Her LDL last February was 151. She was tried on Lipitor and it made her bones ache she said. She is due to have her cholesterol checked next week with her primary care provider. But it continues to be elevated we should try her on another type of statin low-dose every other day if we need to.      CAD (coronary artery disease): Non-obstructive by cath    She reports no chest pain and other than having the syncopal episode she feels very well.       Other Visit Diagnoses    Eye pain, right        Relevant Orders    CT Head Wo Contrast

## 2014-09-28 NOTE — Telephone Encounter (Signed)
CT head results called to patient.    Tarri Fuller PAC

## 2014-09-28 NOTE — Patient Instructions (Signed)
Your physician has recommended that you wear an event monitor. Event monitors are medical devices that record the heart's electrical activity. Doctors most often [place] these monitors to diagnose arrhythmias. Arrhythmias are problems with the speed or rhythm of the heartbeat. The monitor is a small, portable device. You can wear one while you do your normal daily activities. This is usually used to diagnose what is causing palpitations/syncope (passing out).  Non-Cardiac CT scanning, (CAT scanning), is a noninvasive, special x-ray that produces cross-sectional images of the body using x-rays and a computer. CT scans help physicians diagnose and treat medical conditions. For some CT exams, a contrast material is used to enhance visibility in the area of the body being studied. CT scans provide greater clarity and reveal more details than regular x-ray exams.  Your physician recommends that you schedule a follow-up appointment in: 1 Month

## 2014-10-08 ENCOUNTER — Telehealth: Payer: Self-pay | Admitting: Physician Assistant

## 2014-10-09 ENCOUNTER — Telehealth: Payer: Self-pay | Admitting: *Deleted

## 2014-10-09 NOTE — Telephone Encounter (Signed)
Close encounter 

## 2014-10-09 NOTE — Telephone Encounter (Signed)
Received fax from [facility] needing more information on the patient because they have been unable to process the order.  Faxed over recent clinical note documented why monitor placed.

## 2014-10-11 ENCOUNTER — Telehealth: Payer: Self-pay | Admitting: Cardiovascular Disease

## 2014-10-11 NOTE — Telephone Encounter (Signed)
RN SPOKE TO PATIENT INFORMATION GIVEN TO PATIENT ABOUT PREVIOUS CALL CONCERNING MONITOR SHE STATES SHE WILL BE COMPLETING WEARING MONITOR AND WILL BE MAILING OT BACK.  RN INFORMED HER TO CONTINUE WITH  HER PLAN. RN JUST WANTED HER TO BE AWARE. SHE VERBALIZED UNDERSTANDING.

## 2014-10-11 NOTE — Telephone Encounter (Signed)
[employer]  STATES MEDICAL DIRECTOR -HAS DENIED   COVERAGE FOR MONITOR DUE TO COMPANY BEING OUT OF NETWORK. SHE STATES A LETTER WILL BE SENT TO PATIENT TO INFORM HER AND DR BERRY.   RN CALLED [employer] ([phone]) - SPOKE TO REPRESENTATIVE KURT KURT DOUCUMENTED IN PATIENT'S INFO WITH HIS COMPANY.  PER KURT, PATIENT IS MEDICARE - BILLING IS AWAITING FOR AUTHORIZATION -IF NOT [employer] WILL HANDLE SITUATION.

## 2014-10-19 ENCOUNTER — Telehealth: Payer: Self-pay | Admitting: Physician Assistant

## 2014-10-19 NOTE — Telephone Encounter (Signed)
Pt said she wore a monitor for 2 weeks and turned it in on last Friday. Pt says she received another one on Wednesday.She does not know why she is getting another one.

## 2014-10-19 NOTE — Telephone Encounter (Signed)
Spoke to patient, [employer] apparently sent 2nd device to her house. She was unsure what to do with it. We did not have order for 2nd event monitor. Called [employer] to see what they knew, they show no record of equipment being sent & said OK to send back. They are going to follow up w/ patient & handle.

## 2014-10-25 ENCOUNTER — Ambulatory Visit (INDEPENDENT_AMBULATORY_CARE_PROVIDER_SITE_OTHER): Payer: Medicare Other | Admitting: Cardiology

## 2014-10-25 ENCOUNTER — Encounter: Payer: Self-pay | Admitting: Cardiology

## 2014-10-25 ENCOUNTER — Other Ambulatory Visit: Payer: Self-pay | Admitting: *Deleted

## 2014-10-25 VITALS — BP 146/90 | HR 63 | Ht 69.0 in | Wt 157.6 lb

## 2014-10-25 DIAGNOSIS — R55 Syncope and collapse: Secondary | ICD-10-CM

## 2014-10-25 DIAGNOSIS — I214 Non-ST elevation (NSTEMI) myocardial infarction: Secondary | ICD-10-CM

## 2014-10-25 DIAGNOSIS — I10 Essential (primary) hypertension: Secondary | ICD-10-CM

## 2014-10-25 DIAGNOSIS — I2510 Atherosclerotic heart disease of native coronary artery without angina pectoris: Secondary | ICD-10-CM | POA: Diagnosis not present

## 2014-10-25 NOTE — Assessment & Plan Note (Signed)
Non-STEMI Feb 2015

## 2014-10-25 NOTE — Assessment & Plan Note (Signed)
No further chest pain episodes

## 2014-10-25 NOTE — Assessment & Plan Note (Signed)
Recent episode of syncope while turning her head quickly- Holter unremarkable except for an episode of trigimeny

## 2014-10-25 NOTE — Assessment & Plan Note (Signed)
Controlled.  

## 2014-10-25 NOTE — Patient Instructions (Signed)
Your physician recommends that you schedule a follow-up appointment in:6 months with Dr Berry 

## 2014-10-25 NOTE — Progress Notes (Signed)
10/25/2014 Crystal Day   05-23-1935  [id number]  Primary Physician Crystal Naas, MD Primary Cardiologist: Dr Crystal Day  HPI:  80 year old mildly overweight widowed Caucasian female mother of 2 children, grandmother for grandchildren, patient of Dr. Carol Day. She has a history of hypertension and hyperlipidemia. [facility] 2015 for chest pain and non-STEMI. Her Troponin peaked at 5.38.  She ultimately underwent cardiac catheterization by Dr. Peter Martinique revealing nonobstructive CAD and medical therapy was recommended. She had a normal echo March 2nd 2015.            She recently saw Crystal Day for pre op clearance for knee surgery and related a history of syncope. The pt says she had a syncopal spell while standing and turning her head quickly. Crystal Day ordered a head CT and a Holter. These were both OK. She had trigeminy but no significant arrhythmia. She had carotid dopplers in 2007 that showed mild disease. She has a history of a remote stroke. She has had no further episodes since her LOV. She has stopped her ASA in preparation for knee surgery.    Current Outpatient Prescriptions  Medication Sig Dispense Refill  . aspirin EC 81 MG EC tablet Take 1 tablet (81 mg total) by mouth daily.    . metoprolol tartrate (LOPRESSOR) 25 MG tablet Take 1 tablet (25 mg total) by mouth 2 (two) times daily. 60 tablet 5  . nitroGLYCERIN (NITROSTAT) 0.4 MG SL tablet Place 1 tablet (0.4 mg total) under the tongue every 5 (five) minutes as needed for chest pain. 25 tablet 3  . pravastatin (PRAVACHOL) 40 MG tablet Take 40 mg by mouth every other day.    . valsartan-hydrochlorothiazide (DIOVAN HCT) 320-25 MG per tablet Take 1 tablet by mouth daily. 30 tablet 5   No current facility-administered medications for this visit.    Allergies  Allergen Reactions  . Penicillins Hives  . Tetracyclines & Related Nausea Only  . Elavil [Amitriptyline] Rash  .  Erythromycin Rash  . Macrodantin [Nitrofurantoin Macrocrystal] Rash    History   Social History  . Marital Status: Widowed    Spouse Name: N/A  . Number of Children: N/A  . Years of Education: N/A   Occupational History  . Not on file.   Social History Main Topics  . Smoking status: Former Smoker -- 0.25 packs/day for 5 years    Quit date: 08/17/1981  . Smokeless tobacco: Not on file  . Alcohol Use: No  . Drug Use: No  . Sexual Activity: Not on file   Other Topics Concern  . Not on file   Social History Narrative     Review of Systems: General: negative for chills, fever, night sweats or weight changes.  Cardiovascular: negative for chest pain, dyspnea on exertion, edema, orthopnea, palpitations, paroxysmal nocturnal dyspnea or shortness of breath Dermatological: negative for rash Respiratory: negative for cough or wheezing Urologic: negative for hematuria Abdominal: negative for nausea, vomiting, diarrhea, bright red blood per rectum, melena, or hematemesis Neurologic: negative for visual changes, syncope, or dizziness All other systems reviewed and are otherwise negative except as noted above.    Blood pressure 146/90, pulse 63, height 5\' 9"  (1.753 m), weight 157 lb 9.6 oz (71.487 kg).  General appearance: alert, cooperative and no distress Neck: no carotid bruit and no JVD Lungs: clear to auscultation bilaterally Heart: regular rate and rhythm Abdomen: soft, non-tender; bowel sounds normal; no masses,  no organomegaly Extremities:  no edema Neurologic: Grossly normal   ASSESSMENT AND PLAN:   NSTEMI (non-ST elevated myocardial infarction) Non-STEMI Feb 2015   CAD- Non-obstructive by cath Feb 2015 No further chest pain episodes   HTN (hypertension) Controlled   Syncope Recent episode of syncope while turning her head quickly- Holter unremarkable except for an episode of trigimeny    PLAN  I discussed her case with Dr Martinique. It is OK for her to be  off ASA. I did order a carotid doppler to r/o significant carotid disease. She had mild disease in 2007. She should be OK for knee surgery.   Crystal Day KPA-C 10/25/2014 1:18 PM

## 2014-11-02 ENCOUNTER — Ambulatory Visit (HOSPITAL_COMMUNITY)
Admission: RE | Admit: 2014-11-02 | Discharge: 2014-11-02 | Disposition: A | Discharge status: Home / Self Care | Payer: Medicare Other | Source: Ambulatory Visit | Attending: Cardiovascular Disease | Admitting: Cardiovascular Disease

## 2014-11-02 ENCOUNTER — Ambulatory Visit: Payer: Medicare Other | Admitting: Physician Assistant

## 2014-11-02 DIAGNOSIS — R55 Syncope and collapse: Secondary | ICD-10-CM | POA: Diagnosis not present

## 2014-11-02 NOTE — Progress Notes (Signed)
Carotid Duplex Completed. Normal carotid study with no evidence of stenosis. Oda Cogan, BS, RDMS, RVT

## 2014-12-04 ENCOUNTER — Ambulatory Visit: Payer: Self-pay | Admitting: Orthopedic Surgery

## 2014-12-04 NOTE — H&P (Signed)
Crystal Day DOB: 1935-06-29 Widowed / Language: English / Race: White Female Date of Admission: 12/31/2014 CC: Right Knee Pain History of Present Illness The patient is a 80 year old female who comes in for a preoperative History and Physical. The patient is scheduled for a right unicompartmental knee replacement to be performed by Dr. Dione Plover. Aluisio, MD at [facility] on 12-31-2014. The patient is a 80 year old female who presents for follow up of their knee. Symptoms reported today include: pain, pain at night (wakes her up) and instability. She states that the knee pain continues. Note that she had the arthroscopy and has had persistent pain. Did not respond to injections. I felt that she may have had some osteonecrosis on the medial condyle, and she also had an osteochondral defect. She had the MRI scan done. The scan was reviewed. All of her change is isolated to the medial compartment. Patellofemoral and lateral are intact. She has large subchondral cystic changes present with possible osteonecrosis and definite osteochondral defect on the medial femoral condyle. It is all isolated to the medial femoral condyle and does not involve the entire condyle. She has had injections, had arthroscopy, and has persistent pain. It is flet that she would be an excellent candidate for unicompartmental replacement. CT scan was ordered to have the custom-made implants manufactured. They have been treated conservatively in the past for the above stated problem and despite conservative measures, they continue to have progressive pain and severe functional limitations and dysfunction. They have failed non-operative management including home exercise, medications, and injections. It is felt that they would benefit from undergoing total joint replacement. Risks and benefits of the procedure have been discussed with the patient and they elect to proceed with surgery. There are no active contraindications to  surgery such as ongoing infection or rapidly progressive neurological disease.  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 Gantrisin *Sulfonamides** Rash. Macrodantin *Urinary Anti-infectives** Rash. Elavil *ANTIDEPRESSANTS* Rash. Erythromycin *MACROLIDES* Rash.  Family History Cerebrovascular Accident Brother. Cancer Mother. Heart Disease Father. Drug / Alcohol Addiction Father.  Social History Tobacco / smoke exposure 07/27/2014: no Tobacco use Former smoker. 07/27/2014: smoke(d) less than 1/2 pack(s) per day Current work status retired Never consumed alcohol 07/27/2014: Never consumed alcohol Number of flights of stairs before winded 2-3 No history of drug/alcohol rehab Not under pain contract Exercise Exercises daily; does running / walking and other Children 2 Living situation live alone Marital status widowed [place] with Daughter [place] POA  Medication History Valsartan-Hydrochlorothiazide (320-25MG  Tablet, Oral) Active. Metoprolol Tartrate (25MG  Tablet, Oral) Active. Calcium (Oral) Specific dose unknown - Active. Pravastatin Sodium (20MG  Tablet, Oral QOD) Active.  Past Surgical History  Hysterectomy Date: 72. partial (non-cancerous) Breast Biopsy Date: 1976. left Arthroscopy of Knee Date: 11/2013. right Hemorrhoidectomy Date: 1962. Cataract Surgery bilateral Right Thumb Surgery Date:  1969.  Review of Systems General Not Present- Chills, Fatigue, Fever, Memory Loss, Night Sweats, Weight Gain and Weight Loss. Skin Not Present- Eczema, Hives, Itching, Lesions and Rash. HEENT Not Present- Dentures, Double Vision, Headache, Hearing Loss, Tinnitus and Visual Loss. Respiratory Not Present- Allergies, Chronic  Cough, Coughing up blood, Shortness of breath at rest and Shortness of breath with exertion. Cardiovascular Not Present- Chest Pain, Difficulty Breathing Lying Down, Murmur, Palpitations, Racing/skipping heartbeats and Swelling. Gastrointestinal Not Present- Abdominal Pain, Bloody Stool, Constipation, Diarrhea, Difficulty Swallowing, Heartburn, Jaundice, Loss of appetitie, Nausea and Vomiting. Female Genitourinary Not Present- Blood in Urine, Discharge, Flank Pain, Incontinence, Painful Urination, Urgency, Urinary frequency, Urinary Retention, Urinating at Night and Weak urinary stream. Musculoskeletal Present- Joint Pain and Joint Swelling. Not Present- Back Pain, Morning Stiffness, Muscle Pain, Muscle Weakness and Spasms. Neurological Present- Blackout spells (episode prior to her cardiac workup and monitoring.). Not Present- Difficulty with balance, Dizziness, Paralysis, Tremor and Weakness. Psychiatric Not Present- Insomnia.  Vitals Weight: 157 lb Height: 69in Body Surface Area: 1.86 m Body Mass Index: 23.18 kg/m  BP: 138/68 (Sitting, Right Arm, Standard)  Physical Exam  General Mental Status -Alert, cooperative and good historian. General Appearance-pleasant, Not in acute distress. Orientation-Oriented X3. Build & Nutrition-Well nourished and Well developed.  Head and Neck Head-normocephalic, atraumatic . Neck Global Assessment - supple, no bruit auscultated on the right, no bruit auscultated on the left.  Eye Pupil - Bilateral-Regular and Round. Motion - Bilateral-EOMI.  ENMT Note: partial lower denture  Chest and Lung  Exam Auscultation Breath sounds - clear at anterior chest wall and clear at posterior chest wall. Adventitious sounds - No Adventitious sounds.  Cardiovascular Auscultation Rhythm - Regular rate and rhythm. Heart Sounds - S1 WNL and S2 WNL. Murmurs & Other Heart Sounds: Murmur 1 - Location - Aortic Area. Timing - Mid-systolic. Grade - II/VI. Character - Low pitched. Radiation - Right carotid.  Abdomen Palpation/Percussion Tenderness - Abdomen is non-tender to palpation. Rigidity (guarding) - Abdomen is soft. Auscultation Auscultation of the abdomen reveals - Bowel sounds normal.  Female Genitourinary Note: Not done, not pertinent to present illness   Musculoskeletal Note: She is alert and oriented, in no apparent distress. Her right knee shows no effusion. Her range of motion is about 5 to 130. She has tenderness medially. There is no lateral tenderness or instability.  We reviewed her MRI scan. All of her change is isolated to the medial compartment. Patellofemoral and lateral are intact. She has large subchondral cystic changes present with possible osteonecrosis and definite osteochondral defect on the medial femoral condyle. It is all isolated to the medial femoral condyle and does not involve the entire condyle.  Assessment & Plan Primary osteoarthritis of right knee (M17.11) Note:Surgical Plans: Right Unicompartmental Knee Replacement  Disposition: Home with Daughter  PCP: Dr. Alben Spittle - Patient has been seen preoperatively and felt to be stable for surgery. Cards: Dr. Gwenlyn Found - Patient has been seen preoperatively and felt to be stable for surgery. She has stopped her baby 81 mg Aspirin preop in preparation of the surgery.  Topical TXA - MI  Anesthesia Issues: Previous postop nausea and vomiting in the past  Signed electronically by Joelene Millin, III PA-C

## 2014-12-04 NOTE — H&P (Signed)
Crystal Day DOB: 01/12/1935 Widowed / Language: English / Race: White Female Date of Admission:  12/31/2014 CC:  Right Knee Pain History of Present Illness The patient is a 80 year old female who comes in for a preoperative History and Physical. The patient is scheduled for a right unicompartmental knee replacement to be performed by Dr. Dione Plover. Aluisio, MD at [facility] on 12-31-2014. The patient is a 80 year old female who presents for follow up of their knee. Symptoms reported today include: pain, pain at night (wakes her up) and instability. She states that the knee pain continues. Note that she had the arthroscopy and has had persistent pain. Did not respond to injections. I felt that she may have had some osteonecrosis on the medial condyle, and she also had an osteochondral defect. She had the MRI scan done. The scan was reviewed. All of her change is isolated to the medial compartment. Patellofemoral and lateral are intact. She has large subchondral cystic changes present with possible osteonecrosis and definite osteochondral defect on the medial femoral condyle. It is all isolated to the medial femoral condyle and does not involve the entire condyle. She has had injections, had arthroscopy, and has persistent pain. It is flet that she would be an excellent candidate for unicompartmental replacement. CT scan was ordered to have the custom-made implants manufactured. They have been treated conservatively in the past for the above stated problem and despite conservative measures, they continue to have progressive pain and severe functional limitations and dysfunction. They have failed non-operative management including home exercise, medications, and injections. It is felt that they would benefit from undergoing total joint replacement. Risks and benefits of the procedure have been discussed with the patient and they elect to proceed with surgery. There are no active contraindications to  surgery such as ongoing infection or rapidly progressive neurological disease.  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 Gantrisin *Sulfonamides** Rash. Macrodantin *Urinary Anti-infectives** Rash. Elavil *ANTIDEPRESSANTS* Rash. Erythromycin *MACROLIDES* Rash.  Family History Cerebrovascular Accident Brother. Cancer Mother. Heart Disease Father. Drug / Alcohol Addiction Father.  Social History Tobacco / smoke exposure 07/27/2014: no Tobacco use Former smoker. 07/27/2014: smoke(d) less than 1/2 pack(s) per day Current work status retired Never consumed alcohol 07/27/2014: Never consumed alcohol Number of flights of stairs before winded 2-3 No history of drug/alcohol rehab Not under pain contract Exercise Exercises daily; does running / walking and other Children 2 Living situation live alone Marital status widowed [place] with Daughter [place] POA  Medication History Valsartan-Hydrochlorothiazide (320-25MG  Tablet, Oral) Active. Metoprolol Tartrate (25MG  Tablet, Oral) Active. Calcium (Oral) Specific dose unknown - Active. Pravastatin Sodium (20MG  Tablet, Oral QOD) Active.  Past Surgical History  Hysterectomy Date: 27. partial (non-cancerous) Breast Biopsy Date: 1976. left Arthroscopy of Knee Date: 11/2013. right Hemorrhoidectomy Date: 1962. Cataract Surgery bilateral Right Thumb Surgery Date:  1969.  Review of Systems General Not Present- Chills, Fatigue, Fever, Memory Loss, Night Sweats, Weight Gain and Weight Loss. Skin Not Present- Eczema, Hives, Itching, Lesions and Rash. HEENT Not Present- Dentures, Double Vision, Headache, Hearing Loss, Tinnitus and Visual Loss. Respiratory Not Present-  Allergies, Chronic Cough, Coughing up blood, Shortness of breath at rest and Shortness of breath with exertion. Cardiovascular Not Present- Chest Pain, Difficulty Breathing Lying Down, Murmur, Palpitations, Racing/skipping heartbeats and Swelling. Gastrointestinal Not Present- Abdominal Pain, Bloody Stool, Constipation, Diarrhea, Difficulty Swallowing, Heartburn, Jaundice, Loss of appetitie, Nausea and Vomiting. Female Genitourinary Not Present- Blood in Urine, Discharge, Flank Pain, Incontinence, Painful Urination, Urgency, Urinary frequency, Urinary Retention, Urinating at Night and Weak urinary stream. Musculoskeletal Present- Joint Pain and Joint Swelling. Not Present- Back Pain, Morning Stiffness, Muscle Pain, Muscle Weakness and Spasms. Neurological Present- Blackout spells (episode prior to her cardiac workup and monitoring.). Not Present- Difficulty with balance, Dizziness, Paralysis, Tremor and Weakness. Psychiatric Not Present- Insomnia.  Vitals Weight: 157 lb Height: 69in Body Surface Area: 1.86 m Body Mass Index: 23.18 kg/m  BP: 138/68 (Sitting, Right Arm, Standard)  Physical Exam  General Mental Status -Alert, cooperative and good historian. General Appearance-pleasant, Not in acute distress. Orientation-Oriented X3. Build & Nutrition-Well nourished and Well developed.  Head and Neck Head-normocephalic, atraumatic . Neck Global Assessment - supple, no bruit auscultated on the right, no bruit auscultated on the left.  Eye Pupil - Bilateral-Regular and Round. Motion - Bilateral-EOMI.  ENMT Note: partial lower denture  Chest and Lung  Exam Auscultation Breath sounds - clear at anterior chest wall and clear at posterior chest wall. Adventitious sounds - No Adventitious sounds.  Cardiovascular Auscultation Rhythm - Regular rate and rhythm. Heart Sounds - S1 WNL and S2 WNL. Murmurs & Other Heart Sounds: Murmur 1 - Location - Aortic Area. Timing - Mid-systolic. Grade - II/VI. Character - Low pitched. Radiation - Right carotid.  Abdomen Palpation/Percussion Tenderness - Abdomen is non-tender to palpation. Rigidity (guarding) - Abdomen is soft. Auscultation Auscultation of the abdomen reveals - Bowel sounds normal.  Female Genitourinary Note: Not done, not pertinent to present illness   Musculoskeletal Note: She is alert and oriented, in no apparent distress. Her right knee shows no effusion. Her range of motion is about 5 to 130. She has tenderness medially. There is no lateral tenderness or instability.  We reviewed her MRI scan. All of her change is isolated to the medial compartment. Patellofemoral and lateral are intact. She has large subchondral cystic changes present with possible osteonecrosis and definite osteochondral defect on the medial femoral condyle. It is all isolated to the medial femoral condyle and does not involve the entire condyle.  Assessment & Plan Primary osteoarthritis of right knee (M17.11) Note:Surgical Plans: Right Unicompartmental Knee Replacement  Disposition: Home with Daughter  PCP: Dr. Alben Spittle - Patient has been seen preoperatively and felt to be stable for surgery. Cards: Dr. Gwenlyn Found - Patient has been seen preoperatively and felt to be stable for surgery. She has stopped her baby 81 mg Aspirin preop in preparation of the surgery.  Topical TXA - MI  Anesthesia Issues: Previous postop nausea and vomiting in the past  Signed electronically by Joelene Millin, III PA-C

## 2014-12-11 ENCOUNTER — Ambulatory Visit: Payer: Self-pay | Admitting: Orthopedic Surgery

## 2014-12-11 NOTE — Progress Notes (Signed)
Preoperative surgical orders have been place into the [facility] hospital system for [facility] on 12/11/2014, 9:59 AM  by Mickel Crow for surgery on 12-31-14.  Preop Uni Knee orders including Experal, IV Tylenol, and IV Decadron as long as there are no contraindications to the above medications. Arlee Muslim, PA-C

## 2014-12-25 ENCOUNTER — Other Ambulatory Visit (HOSPITAL_COMMUNITY): Payer: Self-pay | Admitting: *Deleted

## 2014-12-25 NOTE — Progress Notes (Addendum)
11-02-14 Carotid Duplex EPIC LOV cardiac clearance. [id number] [place] ,[place] 10-25-14 EPIC ECHO 10-16-13 EPIC EKG 10-25-14 EPIC Cardiac clearance not Dr. Gwenlyn Found on chart Medical clearance note. Dr. Carol Ada on chart Cardiac event monitor report 10-11-14 EPIC

## 2014-12-25 NOTE — Patient Instructions (Addendum)
Keelan C Huneycutt  12/25/2014   Your procedure is scheduled on: 12-31-14  Report to [facility] Main  Entrance and follow signs to               [place] at 10:45 AM.  Call this number if you have problems the morning of surgery [phone]   Remember: ONLY 1 PERSON MAY GO WITH YOU TO SHORT STAY TO GET  READY MORNING OF [place].  Do not eat food after Midnight Sunday night. Clear liquids until 7:45 am.  Nothing by mouth after 7:45 am   Take these medicines the morning of surgery with A SIP OF WATER: Metoprolol Tartrate                             You may not have any metal on your body including hair pins and              piercings  Do not wear jewelry, make-up, lotions, powders or perfumes ,deodarant             Do not wear nail polish.  Do not shave  48 hours prior to surgery.              .   Do not bring valuables to the hospital. [place].  Contacts, dentures or bridgework may not be worn into surgery.  Leave suitcase in the car. After surgery it may be brought to your room.     Marland Kitchen    Special Instructions: coughing and deep breathing exercises, leg exercises               Please read over the following fact sheets you were given: _____________________________________________________________________             [facility] - Preparing for Surgery Before surgery, you can play an important role.  Because skin is not sterile, your skin needs to be as free of germs as possible.  You can reduce the number of germs on your skin by washing with CHG (chlorahexidine gluconate) soap before surgery.  CHG is an antiseptic cleaner which kills germs and bonds with the skin to continue killing germs even after washing. Please DO NOT use if you have an allergy to CHG or antibacterial soaps.  If your skin becomes reddened/irritated stop using the CHG and inform your nurse when you arrive at Short Stay. Do not  shave (including legs and underarms) for at least 48 hours prior to the first CHG shower.  You may shave your face/neck. Please follow these instructions carefully:  1.  Shower with CHG Soap the night before surgery and the  morning of Surgery.  2.  If you choose to wash your hair, wash your hair first as usual with your  normal  shampoo.  3.  After you shampoo, rinse your hair and body thoroughly to remove the  shampoo.                           4.  Use CHG as you would any other liquid soap.  You can apply chg directly  to the skin and wash  Gently with a scrungie or clean washcloth.  5.  Apply the CHG Soap to your body ONLY FROM THE NECK DOWN.   Do not use on face/ open                           Wound or open sores. Avoid contact with eyes, ears mouth and genitals (private parts).                       Wash face,  Genitals (private parts) with your normal soap.             6.  Wash thoroughly, paying special attention to the area where your surgery  will be performed.  7.  Thoroughly rinse your body with warm water from the neck down.  8.  DO NOT shower/wash with your normal soap after using and rinsing off  the CHG Soap.                9.  Pat yourself dry with a clean towel.            10.  Wear clean pajamas.            11.  Place clean sheets on your bed the night of your first shower and do not  sleep with pets. Day of Surgery : Do not apply any lotions/deodorants the morning of surgery.  Please wear clean clothes to the hospital/surgery center.  FAILURE TO FOLLOW THESE INSTRUCTIONS MAY RESULT IN THE CANCELLATION OF YOUR SURGERY PATIENT SIGNATURE_________________________________  NURSE SIGNATURE__________________________________  ________________________________________________________________________   Adam Phenix  An incentive spirometer is a tool that can help keep your lungs clear and active. This tool measures how well you are filling your lungs  with each breath. Taking long deep breaths may help reverse or decrease the chance of developing breathing (pulmonary) problems (especially infection) following:  A long period of time when you are unable to move or be active. BEFORE THE PROCEDURE   If the spirometer includes an indicator to show your best effort, your nurse or respiratory therapist will set it to a desired goal.  If possible, sit up straight or lean slightly forward. Try not to slouch.  Hold the incentive spirometer in an upright position. INSTRUCTIONS FOR USE   Sit on the edge of your bed if possible, or sit up as far as you can in bed or on a chair.  Hold the incentive spirometer in an upright position.  Breathe out normally.  Place the mouthpiece in your mouth and seal your lips tightly around it.  Breathe in slowly and as deeply as possible, raising the piston or the ball toward the top of the column.  Hold your breath for 3-5 seconds or for as long as possible. Allow the piston or ball to fall to the bottom of the column.  Remove the mouthpiece from your mouth and breathe out normally.  Rest for a few seconds and repeat Steps 1 through 7 at least 10 times every 1-2 hours when you are awake. Take your time and take a few normal breaths between deep breaths.  The spirometer may include an indicator to show your best effort. Use the indicator as a goal to work toward during each repetition.  After each set of 10 deep breaths, practice coughing to be sure your lungs are clear. If you have an incision (the cut made at the time of surgery),  support your incision when coughing by placing a pillow or rolled up towels firmly against it. Once you are able to get out of bed, walk around indoors and cough well. You may stop using the incentive spirometer when instructed by your caregiver.  RISKS AND COMPLICATIONS  Take your time so you do not get dizzy or light-headed.  If you are in pain, you may need to take or ask  for pain medication before doing incentive spirometry. It is harder to take a deep breath if you are having pain. AFTER USE  Rest and breathe slowly and easily.  It can be helpful to keep track of a log of your progress. Your caregiver can provide you with a simple table to help with this. If you are using the spirometer at home, follow these instructions: [place] IF:   You are having difficultly using the spirometer.  You have trouble using the spirometer as often as instructed.  Your pain medication is not giving enough relief while using the spirometer.  You develop fever of 100.5 F (38.1 C) or higher. SEEK IMMEDIATE MEDICAL CARE IF:   You cough up bloody sputum that had not been present before.  You develop fever of 102 F (38.9 C) or greater.  You develop worsening pain at or near the incision site. MAKE SURE YOU:   Understand these instructions.  Will watch your condition.  Will get help right away if you are not doing well or get worse. Document Released: 12/14/2006 Document Revised: 10/26/2011 Document Reviewed: 02/14/2007 ExitCare Patient Information 2014 ExitCare, [place].   ________________________________________________________________________  WHAT IS A BLOOD TRANSFUSION? Blood Transfusion Information  A transfusion is the replacement of blood or some of its parts. Blood is made up of multiple cells which provide different functions.  Red blood cells carry oxygen and are used for blood loss replacement.  White blood cells fight against infection.  Platelets control bleeding.  Plasma helps clot blood.  Other blood products are available for specialized needs, such as hemophilia or other clotting disorders. BEFORE THE TRANSFUSION  Who gives blood for transfusions?   Healthy volunteers who are fully evaluated to make sure their blood is safe. This is blood bank blood. Transfusion therapy is the safest it has ever been in the practice of  medicine. Before blood is taken from a donor, a complete history is taken to make sure that person has no history of diseases nor engages in risky social behavior (examples are intravenous drug use or sexual activity with multiple partners). The donor's travel history is screened to minimize risk of transmitting infections, such as malaria. The donated blood is tested for signs of infectious diseases, such as HIV and hepatitis. The blood is then tested to be sure it is compatible with you in order to minimize the chance of a transfusion reaction. If you or a relative donates blood, this is often done in anticipation of surgery and is not appropriate for emergency situations. It takes many days to process the donated blood. RISKS AND COMPLICATIONS Although transfusion therapy is very safe and saves many lives, the main dangers of transfusion include:   Getting an infectious disease.  Developing a transfusion reaction. This is an allergic reaction to something in the blood you were given. Every precaution is taken to prevent this. The decision to have a blood transfusion has been considered carefully by your caregiver before blood is given. Blood is not given unless the benefits outweigh the risks. AFTER THE TRANSFUSION  Right after receiving a blood transfusion, you will usually feel much better and more energetic. This is especially true if your red blood cells have gotten low (anemic). The transfusion raises the level of the red blood cells which carry oxygen, and this usually causes an energy increase.  The nurse administering the transfusion will monitor you carefully for complications. HOME CARE INSTRUCTIONS  No special instructions are needed after a transfusion. You may find your energy is better. Speak with your caregiver about any limitations on activity for underlying diseases you may have. SEEK MEDICAL CARE IF:   Your condition is not improving after your transfusion.  You develop  redness or irritation at the intravenous (IV) site. SEEK IMMEDIATE MEDICAL CARE IF:  Any of the following symptoms occur over the next 12 hours:  Shaking chills.  You have a temperature by mouth above 102 F (38.9 C), not controlled by medicine.  Chest, back, or muscle pain.  People around you feel you are not acting correctly or are confused.  Shortness of breath or difficulty breathing.  Dizziness and fainting.  You get a rash or develop hives.  You have a decrease in urine output.  Your urine turns a dark color or changes to pink, red, or brown. Any of the following symptoms occur over the next 10 days:  You have a temperature by mouth above 102 F (38.9 C), not controlled by medicine.  Shortness of breath.  Weakness after normal activity.  The white part of the eye turns yellow (jaundice).  You have a decrease in the amount of urine or are urinating less often.  Your urine turns a dark color or changes to pink, red, or brown. Document Released: 07/31/2000 Document Revised: 10/26/2011 Document Reviewed: 03/19/2008 ExitCare Patient Information 2014 ExitCare, [place].  _______________________________________________________________________   CLEAR LIQUID DIET   Foods Allowed                                                                     Foods Excluded  Coffee and tea, regular and decaf                             liquids that you cannot  Plain Jell-O in any flavor                                             see through such as: Fruit ices (not with fruit pulp)                                     milk, soups, orange juice  Iced Popsicles                                    All solid food Carbonated beverages, regular and diet  Cranberry, grape and apple juices Sports drinks like Gatorade Lightly seasoned clear broth or consume(fat free) Sugar, honey syrup  Sample Menu Breakfast                                Lunch                                      Supper Cranberry juice                    Beef broth                            Chicken broth Jell-O                                     Grape juice                           Apple juice Coffee or tea                        Jell-O                                      Popsicle                                                Coffee or tea                        Coffee or tea  _____________________________________________________________________

## 2014-12-26 ENCOUNTER — Encounter (HOSPITAL_COMMUNITY)
Admission: RE | Admit: 2014-12-26 | Discharge: 2014-12-26 | Disposition: A | Discharge status: Home / Self Care | Payer: Medicare Other | Source: Ambulatory Visit | Attending: Orthopedic Surgery | Admitting: Orthopedic Surgery

## 2014-12-26 ENCOUNTER — Encounter (HOSPITAL_COMMUNITY): Payer: Self-pay

## 2014-12-26 DIAGNOSIS — Z01812 Encounter for preprocedural laboratory examination: Secondary | ICD-10-CM | POA: Diagnosis present

## 2014-12-26 DIAGNOSIS — M1711 Unilateral primary osteoarthritis, right knee: Secondary | ICD-10-CM | POA: Diagnosis not present

## 2014-12-26 HISTORY — DX: Personal history of other specified conditions: Z87.898

## 2014-12-26 HISTORY — DX: Adverse effect of unspecified anesthetic, initial encounter: T41.45XA

## 2014-12-26 HISTORY — DX: Other complications of anesthesia, initial encounter: T88.59XA

## 2014-12-26 LAB — COMPREHENSIVE METABOLIC PANEL
ALT: 11 U/L — ABNORMAL LOW (ref 14–54)
AST: 24 U/L (ref 15–41)
Albumin: 4.3 g/dL (ref 3.5–5.0)
Alkaline Phosphatase: 44 U/L (ref 38–126)
Anion gap: 7 (ref 5–15)
BUN: 21 mg/dL — ABNORMAL HIGH (ref 6–20)
CO2: 30 mmol/L (ref 22–32)
Calcium: 9.7 mg/dL (ref 8.9–10.3)
Chloride: 104 mmol/L (ref 101–111)
Creatinine, Ser: 1.17 mg/dL — ABNORMAL HIGH (ref 0.44–1.00)
GFR calc Af Amer: 50 mL/min — ABNORMAL LOW (ref >60)
GFR calc non Af Amer: 43 mL/min — ABNORMAL LOW (ref >60)
Glucose, Bld: 104 mg/dL — ABNORMAL HIGH (ref 70–99)
Potassium: 4.3 mmol/L (ref 3.5–5.1)
Sodium: 141 mmol/L (ref 135–145)
Total Bilirubin: 0.8 mg/dL (ref 0.3–1.2)
Total Protein: 7.3 g/dL (ref 6.5–8.1)

## 2014-12-26 LAB — URINALYSIS, ROUTINE W REFLEX MICROSCOPIC
Bilirubin Urine: NEGATIVE
Glucose, UA: NEGATIVE mg/dL
Hgb urine dipstick: NEGATIVE
Ketones, ur: NEGATIVE mg/dL
Leukocytes, UA: NEGATIVE
Nitrite: NEGATIVE
Protein, ur: NEGATIVE mg/dL
Specific Gravity, Urine: 1.02 (ref 1.005–1.030)
Urobilinogen, UA: 0.2 mg/dL (ref 0.0–1.0)
pH: 5 (ref 5.0–8.0)

## 2014-12-26 LAB — CBC
HCT: 41.3 % (ref 36.0–46.0)
Hemoglobin: 13.3 g/dL (ref 12.0–15.0)
MCH: 28.4 pg (ref 26.0–34.0)
MCHC: 32.2 g/dL (ref 30.0–36.0)
MCV: 88.2 fL (ref 78.0–100.0)
Platelets: 161 10*3/uL (ref 150–400)
RBC: 4.68 MIL/uL (ref 3.87–5.11)
RDW: 12.6 % (ref 11.5–15.5)
WBC: 4.8 10*3/uL (ref 4.0–10.5)

## 2014-12-26 LAB — ABO/RH: ABO/RH(D): O POS

## 2014-12-26 LAB — APTT: aPTT: 27 seconds (ref 24–37)

## 2014-12-26 LAB — PROTIME-INR
INR: 0.96 (ref 0.00–1.49)
Prothrombin Time: 12.9 seconds (ref 11.6–15.2)

## 2014-12-26 LAB — SURGICAL PCR SCREEN
MRSA, PCR: NEGATIVE
Staphylococcus aureus: NEGATIVE

## 2014-12-31 ENCOUNTER — Inpatient Hospital Stay (HOSPITAL_COMMUNITY): Payer: Medicare Other | Admitting: Anesthesiology

## 2014-12-31 ENCOUNTER — Encounter (HOSPITAL_COMMUNITY): Payer: Self-pay | Admitting: *Deleted

## 2014-12-31 ENCOUNTER — Observation Stay (HOSPITAL_COMMUNITY)
Admission: RE | Admit: 2014-12-31 | Discharge: 2015-01-01 | Disposition: A | Discharge status: Home / Self Care | Payer: Medicare Other | Source: Ambulatory Visit | Attending: Orthopedic Surgery | Admitting: Orthopedic Surgery

## 2014-12-31 ENCOUNTER — Encounter (HOSPITAL_COMMUNITY)
Admission: RE | Disposition: A | Discharge status: Home / Self Care | Payer: Self-pay | Source: Ambulatory Visit | Attending: Orthopedic Surgery

## 2014-12-31 DIAGNOSIS — Z96652 Presence of left artificial knee joint: Secondary | ICD-10-CM | POA: Insufficient documentation

## 2014-12-31 DIAGNOSIS — I252 Old myocardial infarction: Secondary | ICD-10-CM | POA: Diagnosis not present

## 2014-12-31 DIAGNOSIS — Z881 Allergy status to other antibiotic agents status: Secondary | ICD-10-CM | POA: Insufficient documentation

## 2014-12-31 DIAGNOSIS — Z9861 Coronary angioplasty status: Secondary | ICD-10-CM | POA: Diagnosis not present

## 2014-12-31 DIAGNOSIS — I10 Essential (primary) hypertension: Secondary | ICD-10-CM | POA: Diagnosis not present

## 2014-12-31 DIAGNOSIS — Z87891 Personal history of nicotine dependence: Secondary | ICD-10-CM | POA: Diagnosis not present

## 2014-12-31 DIAGNOSIS — Z85828 Personal history of other malignant neoplasm of skin: Secondary | ICD-10-CM | POA: Diagnosis not present

## 2014-12-31 DIAGNOSIS — Z8673 Personal history of transient ischemic attack (TIA), and cerebral infarction without residual deficits: Secondary | ICD-10-CM | POA: Diagnosis not present

## 2014-12-31 DIAGNOSIS — I2510 Atherosclerotic heart disease of native coronary artery without angina pectoris: Secondary | ICD-10-CM | POA: Diagnosis not present

## 2014-12-31 DIAGNOSIS — Z90710 Acquired absence of both cervix and uterus: Secondary | ICD-10-CM | POA: Insufficient documentation

## 2014-12-31 DIAGNOSIS — J189 Pneumonia, unspecified organism: Secondary | ICD-10-CM | POA: Insufficient documentation

## 2014-12-31 DIAGNOSIS — M1711 Unilateral primary osteoarthritis, right knee: Principal | ICD-10-CM | POA: Insufficient documentation

## 2014-12-31 DIAGNOSIS — E785 Hyperlipidemia, unspecified: Secondary | ICD-10-CM | POA: Insufficient documentation

## 2014-12-31 DIAGNOSIS — Z880 Allergy status to penicillin: Secondary | ICD-10-CM | POA: Insufficient documentation

## 2014-12-31 DIAGNOSIS — M179 Osteoarthritis of knee, unspecified: Secondary | ICD-10-CM | POA: Diagnosis present

## 2014-12-31 DIAGNOSIS — Z7982 Long term (current) use of aspirin: Secondary | ICD-10-CM | POA: Insufficient documentation

## 2014-12-31 DIAGNOSIS — Z888 Allergy status to other drugs, medicaments and biological substances status: Secondary | ICD-10-CM | POA: Insufficient documentation

## 2014-12-31 DIAGNOSIS — M1710 Unilateral primary osteoarthritis, unspecified knee: Secondary | ICD-10-CM | POA: Diagnosis present

## 2014-12-31 HISTORY — DX: Other specified postprocedural states: Z98.890

## 2014-12-31 HISTORY — PX: PARTIAL KNEE ARTHROPLASTY: SHX2174

## 2014-12-31 HISTORY — DX: Nausea with vomiting, unspecified: R11.2

## 2014-12-31 LAB — TYPE AND SCREEN
ABO/RH(D): O POS
Antibody Screen: NEGATIVE

## 2014-12-31 SURGERY — ARTHROPLASTY, KNEE, UNICOMPARTMENTAL
Anesthesia: Spinal | Site: Knee | Laterality: Right

## 2014-12-31 MED ORDER — METOCLOPRAMIDE HCL 10 MG PO TABS: 5.0000 mg | ORAL_TABLET | Freq: Three times a day (TID) | ORAL | Status: DC | PRN

## 2014-12-31 MED ORDER — ACETAMINOPHEN 500 MG PO TABS
1000.0000 mg | ORAL_TABLET | Freq: Four times a day (QID) | ORAL | Status: DC
  Administered 2014-12-31 – 2015-01-01 (×3): 1000 mg via ORAL
  Filled 2014-12-31 (×4): qty 2

## 2014-12-31 MED ORDER — DOCUSATE SODIUM 100 MG PO CAPS
100.0000 mg | ORAL_CAPSULE | Freq: Two times a day (BID) | ORAL | Status: DC
  Administered 2014-12-31 – 2015-01-01 (×2): 100 mg via ORAL

## 2014-12-31 MED ORDER — BISACODYL 10 MG RE SUPP: 10.0000 mg | Freq: Every day | RECTAL | Status: DC | PRN

## 2014-12-31 MED ORDER — IRBESARTAN 300 MG PO TABS
300.0000 mg | ORAL_TABLET | Freq: Every day | ORAL | Status: DC
  Administered 2015-01-01: 300 mg via ORAL
  Filled 2014-12-31: qty 1

## 2014-12-31 MED ORDER — PHENOL 1.4 % MT LIQD
1.0000 | OROMUCOSAL | Status: DC | PRN
  Filled 2014-12-31: qty 177

## 2014-12-31 MED ORDER — PROPOFOL 10 MG/ML IV BOLUS
INTRAVENOUS | Status: AC
  Filled 2014-12-31: qty 20

## 2014-12-31 MED ORDER — TRAMADOL HCL 50 MG PO TABS
50.0000 mg | ORAL_TABLET | Freq: Four times a day (QID) | ORAL | Status: DC | PRN
  Administered 2015-01-01: 100 mg via ORAL
  Administered 2015-01-01: 50 mg via ORAL
  Filled 2014-12-31: qty 1
  Filled 2014-12-31: qty 2

## 2014-12-31 MED ORDER — ONDANSETRON HCL 4 MG/2ML IJ SOLN
4.0000 mg | Freq: Four times a day (QID) | INTRAMUSCULAR | Status: DC | PRN
  Administered 2015-01-01 (×2): 4 mg via INTRAVENOUS
  Filled 2014-12-31 (×2): qty 2

## 2014-12-31 MED ORDER — MEPERIDINE HCL 50 MG/ML IJ SOLN: 6.2500 mg | INTRAMUSCULAR | Status: DC | PRN

## 2014-12-31 MED ORDER — DEXAMETHASONE SODIUM PHOSPHATE 10 MG/ML IJ SOLN
INTRAMUSCULAR | Status: AC
  Filled 2014-12-31: qty 1

## 2014-12-31 MED ORDER — RIVAROXABAN 10 MG PO TABS
10.0000 mg | ORAL_TABLET | Freq: Every day | ORAL | Status: DC
  Administered 2015-01-01: 10 mg via ORAL
  Filled 2014-12-31 (×2): qty 1

## 2014-12-31 MED ORDER — ONDANSETRON HCL 4 MG/2ML IJ SOLN
INTRAMUSCULAR | Status: DC | PRN
  Administered 2014-12-31: 4 mg via INTRAVENOUS

## 2014-12-31 MED ORDER — POLYETHYLENE GLYCOL 3350 17 G PO PACK: 17.0000 g | PACK | Freq: Every day | ORAL | Status: DC | PRN

## 2014-12-31 MED ORDER — METHOCARBAMOL 500 MG PO TABS
500.0000 mg | ORAL_TABLET | Freq: Four times a day (QID) | ORAL | Status: DC | PRN
Start: 2014-12-31 — End: 2015-01-01

## 2014-12-31 MED ORDER — TRANEXAMIC ACID 1000 MG/10ML IV SOLN
2000.0000 mg | INTRAVENOUS | Status: DC | PRN
  Administered 2014-12-31: 2000 mg via TOPICAL

## 2014-12-31 MED ORDER — DEXAMETHASONE SODIUM PHOSPHATE 10 MG/ML IJ SOLN
10.0000 mg | Freq: Once | INTRAMUSCULAR | Status: AC
  Administered 2014-12-31: 10 mg via INTRAVENOUS

## 2014-12-31 MED ORDER — METOCLOPRAMIDE HCL 5 MG/ML IJ SOLN: 5.0000 mg | Freq: Three times a day (TID) | INTRAMUSCULAR | Status: DC | PRN

## 2014-12-31 MED ORDER — DIPHENHYDRAMINE HCL 12.5 MG/5ML PO ELIX: 12.5000 mg | ORAL_SOLUTION | ORAL | Status: DC | PRN

## 2014-12-31 MED ORDER — BUPIVACAINE HCL (PF) 0.25 % IJ SOLN
INTRAMUSCULAR | Status: AC
  Filled 2014-12-31: qty 30

## 2014-12-31 MED ORDER — SODIUM CHLORIDE 0.9 % IJ SOLN
INTRAMUSCULAR | Status: DC | PRN
  Administered 2014-12-31: 40 mL

## 2014-12-31 MED ORDER — SODIUM CHLORIDE 0.9 % IV SOLN: INTRAVENOUS | Status: DC

## 2014-12-31 MED ORDER — MORPHINE SULFATE 2 MG/ML IJ SOLN: 1.0000 mg | INTRAMUSCULAR | Status: DC | PRN

## 2014-12-31 MED ORDER — 0.9 % SODIUM CHLORIDE (POUR BTL) OPTIME
TOPICAL | Status: DC | PRN
  Administered 2014-12-31: 1000 mL

## 2014-12-31 MED ORDER — ONDANSETRON HCL 4 MG/2ML IJ SOLN: 4.0000 mg | Freq: Once | INTRAMUSCULAR | Status: DC | PRN

## 2014-12-31 MED ORDER — PROPOFOL 10 MG/ML IV BOLUS
INTRAVENOUS | Status: AC
Start: 2014-12-31 — End: 2014-12-31
  Filled 2014-12-31: qty 20

## 2014-12-31 MED ORDER — OXYCODONE HCL 5 MG PO TABS
5.0000 mg | ORAL_TABLET | ORAL | Status: DC | PRN
  Administered 2014-12-31 (×2): 10 mg via ORAL
  Filled 2014-12-31 (×2): qty 2

## 2014-12-31 MED ORDER — TRANEXAMIC ACID 1000 MG/10ML IV SOLN
2000.0000 mg | Freq: Once | INTRAVENOUS | Status: DC
  Filled 2014-12-31: qty 20

## 2014-12-31 MED ORDER — SODIUM CHLORIDE 0.9 % IV SOLN
INTRAVENOUS | Status: DC
  Administered 2014-12-31: 18:00:00 via INTRAVENOUS

## 2014-12-31 MED ORDER — ACETAMINOPHEN 650 MG RE SUPP: 650.0000 mg | Freq: Four times a day (QID) | RECTAL | Status: DC | PRN

## 2014-12-31 MED ORDER — DEXTROSE 5 % IV SOLN
500.0000 mg | Freq: Four times a day (QID) | INTRAVENOUS | Status: DC | PRN
  Administered 2014-12-31: 500 mg via INTRAVENOUS
  Filled 2014-12-31 (×2): qty 5

## 2014-12-31 MED ORDER — BUPIVACAINE LIPOSOME 1.3 % IJ SUSP
INTRAMUSCULAR | Status: DC | PRN
  Administered 2014-12-31: 20 mL

## 2014-12-31 MED ORDER — VANCOMYCIN HCL IN DEXTROSE 1-5 GM/200ML-% IV SOLN
1000.0000 mg | INTRAVENOUS | Status: AC
  Administered 2014-12-31: 1000 mg via INTRAVENOUS
  Filled 2014-12-31: qty 200

## 2014-12-31 MED ORDER — MENTHOL 3 MG MT LOZG: 1.0000 | LOZENGE | OROMUCOSAL | Status: DC | PRN

## 2014-12-31 MED ORDER — DEXAMETHASONE SODIUM PHOSPHATE 10 MG/ML IJ SOLN
10.0000 mg | Freq: Once | INTRAMUSCULAR | Status: DC
  Filled 2014-12-31: qty 1

## 2014-12-31 MED ORDER — ONDANSETRON HCL 4 MG/2ML IJ SOLN
INTRAMUSCULAR | Status: AC
  Filled 2014-12-31: qty 2

## 2014-12-31 MED ORDER — NITROGLYCERIN 0.4 MG SL SUBL: 0.4000 mg | SUBLINGUAL_TABLET | SUBLINGUAL | Status: DC | PRN

## 2014-12-31 MED ORDER — BUPIVACAINE LIPOSOME 1.3 % IJ SUSP
20.0000 mL | Freq: Once | INTRAMUSCULAR | Status: DC
  Filled 2014-12-31: qty 20

## 2014-12-31 MED ORDER — SODIUM CHLORIDE 0.9 % IJ SOLN
INTRAMUSCULAR | Status: AC
  Filled 2014-12-31: qty 50

## 2014-12-31 MED ORDER — ACETAMINOPHEN 10 MG/ML IV SOLN
1000.0000 mg | Freq: Once | INTRAVENOUS | Status: AC
  Administered 2014-12-31: 1000 mg via INTRAVENOUS
  Filled 2014-12-31: qty 100

## 2014-12-31 MED ORDER — ACETAMINOPHEN 325 MG PO TABS: 650.0000 mg | ORAL_TABLET | Freq: Four times a day (QID) | ORAL | Status: DC | PRN

## 2014-12-31 MED ORDER — SODIUM CHLORIDE 0.9 % IR SOLN
Status: DC | PRN
  Administered 2014-12-31: 1000 mL

## 2014-12-31 MED ORDER — PROPOFOL INFUSION 10 MG/ML OPTIME
INTRAVENOUS | Status: DC | PRN
  Administered 2014-12-31: 75 ug/kg/min via INTRAVENOUS

## 2014-12-31 MED ORDER — BUPIVACAINE HCL (PF) 0.25 % IJ SOLN
INTRAMUSCULAR | Status: DC | PRN
  Administered 2014-12-31: 30 mL

## 2014-12-31 MED ORDER — METOPROLOL TARTRATE 25 MG PO TABS
25.0000 mg | ORAL_TABLET | Freq: Two times a day (BID) | ORAL | Status: DC
  Administered 2014-12-31 – 2015-01-01 (×2): 25 mg via ORAL
  Filled 2014-12-31 (×3): qty 1

## 2014-12-31 MED ORDER — HYDROMORPHONE HCL 1 MG/ML IJ SOLN
INTRAMUSCULAR | Status: AC
Start: 2014-12-31 — End: 2014-12-31
  Administered 2014-12-31: 0.25 mg via INTRAVENOUS
  Filled 2014-12-31: qty 1

## 2014-12-31 MED ORDER — PROPOFOL 10 MG/ML IV BOLUS
INTRAVENOUS | Status: DC | PRN
  Administered 2014-12-31: 20 mg via INTRAVENOUS

## 2014-12-31 MED ORDER — FLEET ENEMA 7-19 GM/118ML RE ENEM: 1.0000 | ENEMA | Freq: Once | RECTAL | Status: AC | PRN

## 2014-12-31 MED ORDER — VANCOMYCIN HCL IN DEXTROSE 1-5 GM/200ML-% IV SOLN
1000.0000 mg | Freq: Once | INTRAVENOUS | Status: AC
  Administered 2015-01-01: 1000 mg via INTRAVENOUS
  Filled 2014-12-31: qty 200

## 2014-12-31 MED ORDER — HYDROMORPHONE HCL 1 MG/ML IJ SOLN
0.2500 mg | INTRAMUSCULAR | Status: DC | PRN
  Administered 2014-12-31 (×2): 0.25 mg via INTRAVENOUS

## 2014-12-31 MED ORDER — CEFAZOLIN SODIUM-DEXTROSE 2-3 GM-% IV SOLR: 2.0000 g | Freq: Four times a day (QID) | INTRAVENOUS | Status: DC

## 2014-12-31 MED ORDER — ONDANSETRON HCL 4 MG PO TABS: 4.0000 mg | ORAL_TABLET | Freq: Four times a day (QID) | ORAL | Status: DC | PRN

## 2014-12-31 MED ORDER — LACTATED RINGERS IV SOLN
INTRAVENOUS | Status: DC
  Administered 2014-12-31 (×2): via INTRAVENOUS

## 2014-12-31 MED ORDER — HYDROCHLOROTHIAZIDE 25 MG PO TABS
25.0000 mg | ORAL_TABLET | Freq: Every morning | ORAL | Status: DC
  Administered 2015-01-01: 25 mg via ORAL
  Filled 2014-12-31: qty 1

## 2014-12-31 SURGICAL SUPPLY — 62 items
BAG DECANTER FOR FLEXI CONT (MISCELLANEOUS) ×2 IMPLANT
BAG SPEC THK2 15X12 ZIP CLS (MISCELLANEOUS)
BAG ZIPLOCK 12X15 (MISCELLANEOUS) IMPLANT
BANDAGE ELASTIC 6 VELCRO ST LF (GAUZE/BANDAGES/DRESSINGS) ×2 IMPLANT
BANDAGE ESMARK 6X9 LF (GAUZE/BANDAGES/DRESSINGS) ×1 IMPLANT
BLADE SAW RECIPROCATING 77.5 (BLADE) ×2 IMPLANT
BLADE SAW SGTL 13.0X1.19X90.0M (BLADE) ×2 IMPLANT
BNDG CMPR 9X6 STRL LF SNTH (GAUZE/BANDAGES/DRESSINGS) ×1
BNDG ESMARK 6X9 LF (GAUZE/BANDAGES/DRESSINGS) ×2
BOWL SMART MIX CTS (DISPOSABLE) ×2 IMPLANT
BUR OVAL CARBIDE 4.0 (BURR) ×2 IMPLANT
CAPT KNEE PARTIAL 2 ×2 IMPLANT
CEMENT HV SMART SET (Cement) ×2 IMPLANT
CUFF TOURN SGL QUICK 34 (TOURNIQUET CUFF) ×2
CUFF TRNQT CYL 34X4X40X1 (TOURNIQUET CUFF) ×1 IMPLANT
DRAPE EXTREMITY T 121X128X90 (DRAPE) ×2 IMPLANT
DRAPE POUCH INSTRU U-SHP 10X18 (DRAPES) ×2 IMPLANT
DRSG ADAPTIC 3X8 NADH LF (GAUZE/BANDAGES/DRESSINGS) ×2 IMPLANT
DRSG PAD ABDOMINAL 8X10 ST (GAUZE/BANDAGES/DRESSINGS) ×2 IMPLANT
DURAPREP 26ML APPLICATOR (WOUND CARE) ×2 IMPLANT
ELECT REM PT RETURN 9FT ADLT (ELECTROSURGICAL) ×2
ELECTRODE REM PT RTRN 9FT ADLT (ELECTROSURGICAL) ×1 IMPLANT
EVACUATOR 1/8 PVC DRAIN (DRAIN) ×2 IMPLANT
FACESHIELD WRAPAROUND (MASK) ×10 IMPLANT
FACESHIELD WRAPAROUND OR TEAM (MASK) ×5 IMPLANT
GAUZE SPONGE 4X4 12PLY STRL (GAUZE/BANDAGES/DRESSINGS) ×2 IMPLANT
GLOVE BIO SURGEON STRL SZ7.5 (GLOVE) ×2 IMPLANT
GLOVE BIO SURGEON STRL SZ8 (GLOVE) ×2 IMPLANT
GLOVE BIOGEL PI IND STRL 6.5 (GLOVE) IMPLANT
GLOVE BIOGEL PI IND STRL 8 (GLOVE) ×2 IMPLANT
GLOVE BIOGEL PI INDICATOR 6.5 (GLOVE) ×2
GLOVE BIOGEL PI INDICATOR 8 (GLOVE) ×2
GLOVE SURG SS PI 6.0 STRL IVOR (GLOVE) ×1 IMPLANT
GLOVE SURG SS PI 6.5 STRL IVOR (GLOVE) ×1 IMPLANT
GOWN BRE IMP SLV SIRUS LXLNG (GOWN DISPOSABLE) ×1 IMPLANT
GOWN STRL REUS W/TWL LRG LVL3 (GOWN DISPOSABLE) ×3 IMPLANT
GOWN STRL REUS W/TWL XL LVL3 (GOWN DISPOSABLE) ×2 IMPLANT
HANDPIECE INTERPULSE COAX TIP (DISPOSABLE) ×2
IMMOBILIZER KNEE 20 (SOFTGOODS) ×2
IMMOBILIZER KNEE 20 THIGH 36 (SOFTGOODS) ×1 IMPLANT
KIT BASIN OR (CUSTOM PROCEDURE TRAY) ×2 IMPLANT
KIT IMPL STRL TIB IPOLY IUNI IMPLANT
MANIFOLD NEPTUNE II (INSTRUMENTS) ×2 IMPLANT
NDL SAFETY ECLIPSE 18X1.5 (NEEDLE) ×2 IMPLANT
NEEDLE HYPO 18GX1.5 SHARP (NEEDLE) ×4
PACK TOTAL JOINT (CUSTOM PROCEDURE TRAY) ×2 IMPLANT
PADDING CAST COTTON 6X4 STRL (CAST SUPPLIES) ×5 IMPLANT
POSITIONER SURGICAL ARM (MISCELLANEOUS) ×2 IMPLANT
SET HNDPC FAN SPRY TIP SCT (DISPOSABLE) ×1 IMPLANT
STRIP CLOSURE SKIN 1/2X4 (GAUZE/BANDAGES/DRESSINGS) ×4 IMPLANT
SUCTION FRAZIER 12FR DISP (SUCTIONS) ×2 IMPLANT
SUT MNCRL AB 4-0 PS2 18 (SUTURE) ×2 IMPLANT
SUT PDS AB 1 CT1 27 (SUTURE) ×1 IMPLANT
SUT VIC AB 2-0 CT1 27 (SUTURE) ×4
SUT VIC AB 2-0 CT1 TAPERPNT 27 (SUTURE) ×2 IMPLANT
SUT VLOC 180 0 24IN GS25 (SUTURE) ×2 IMPLANT
SYR 20CC LL (SYRINGE) ×2 IMPLANT
SYR 50ML LL SCALE MARK (SYRINGE) ×3 IMPLANT
TOWEL OR 17X26 10 PK STRL BLUE (TOWEL DISPOSABLE) ×2 IMPLANT
TOWEL OR NON WOVEN STRL DISP B (DISPOSABLE) ×1 IMPLANT
TRAY FOLEY W/METER SILVER 14FR (SET/KITS/TRAYS/PACK) ×2 IMPLANT
WRAP KNEE MAXI GEL POST OP (GAUZE/BANDAGES/DRESSINGS) ×1 IMPLANT

## 2014-12-31 NOTE — Transfer of Care (Signed)
Immediate Anesthesia Transfer of Care Note  Patient: Crystal Day  Procedure(s) Performed: Procedure(s): RIGHT KNEE MEDIAL UNICOMPARTMENTAL ARTHROPLASTY (Right)  Patient Location: PACU  Anesthesia Type:MAC and Spinal  Level of Consciousness: awake, alert  and patient cooperative  Airway & Oxygen Therapy: Patient Spontanous Breathing and Patient connected to face mask oxygen  Post-op Assessment: Report given to RN and Post -op Vital signs reviewed and stable  Post vital signs: Reviewed and stable  Last Vitals:  Filed Vitals:   12/31/14 1055  BP: 143/75  Pulse: 74  Temp: 36.6 C  Resp: 18    Complications: No apparent anesthesia complications

## 2014-12-31 NOTE — Anesthesia Postprocedure Evaluation (Signed)
Anesthesia Post Note  Patient: Dustine C Blakely  Procedure(s) Performed: Procedure(s) (LRB): RIGHT KNEE MEDIAL UNICOMPARTMENTAL ARTHROPLASTY (Right)  Anesthesia type: spinal  Patient location: PACU  Post pain: Pain level controlled  Post assessment: Patient's Cardiovascular Status Stable  Last Vitals:  Filed Vitals:   12/31/14 1544  BP:   Pulse: 70  Temp:   Resp: 15    Post vital signs: Reviewed and stable  Level of consciousness: awake  Complications: No apparent anesthesia complications

## 2014-12-31 NOTE — Op Note (Signed)
OPERATIVE REPORT  PREOPERATIVE DIAGNOSIS: Medial compartment osteoarthritis, Right knee  POSTOPERATIVE DIAGNOSIS: Medial compartment osteoarthritis, Right knee  PROCEDURE:Right knee medial unicompartmental arthroplasty.   SURGEON: Gaynelle Arabian, MD   ASSISTANT: Arlee Muslim, PA-C  ANESTHESIA:  Spinal.   ESTIMATED BLOOD LOSS: Minimal.   DRAINS: Hemovac x1.   TOURNIQUET TIME: 43 minutes at 300 mmHg.   COMPLICATIONS: None.   CONDITION: Stable to recovery.   BRIEF CLINICAL NOTE:Crystal Day is a 80 y.o. female, who has  significant isolated medial compartment arthritis of the Right knee. She has had nonoperative management including injections. She has had  cortisone and viscous supplements. Unfortunately, the pain persists.  Radiograph showed isolated medial compartment bone-on-bone arthritis with osteochondral defect and  with normal-appearing patellofemoral and lateral compartments. She  presents now for left knee unicompartmental arthroplasty.   PROCEDURE IN DETAIL: After successful administration of  Spinal anesthetic, a tourniquet was placed high on the  Right thigh and left lower extremity prepped and draped in usual sterile fashion. Extremity was wrapped in an Esmarch, knee flexed, and tourniquet inflated to 300 mmHg. A midline incision was made with a 10 blade through subcutaneous  tissue to the extensor mechanism. A fresh blade was used to make a  medial parapatellar arthrotomy. Soft tissue on the proximal medial  tibia subperiosteally elevated to the joint line with a knife and into  the semimembranosus bursa with a Cobb elevator. The patella was  subluxed laterally, and the knee flexed 90 degrees. The ACL was intact.  The marginal osteophytes on the medial femur and tibia were removed with  a rongeur. The medial meniscus was also removed. The femoral cutting  block where the conformis unicompartmental knee system was placed along  the femur. There  was excellent fit. I traced the outline. We then  removed any remaining cartilage within this outline. We then placed the  cutting block again and pinned in position. The posterior femoral cut  was made, it was approximately 5 mm. The lug holes for the femoral  component were then drilled through the cutting block. The cutting  block was subsequently removed. We then utilized the high speed burr to  create a small trough at the superior aspect of the components that of  the inset and would not overhang the cartilage. The trial was placed,  it had excellent fit. The trial was subsequently removed.       The trial was placed again and the B chip was placed. There was  excellent balance throughout full motion. Also with excellent fit on  her tibia. This was removed as was the femoral trial. A curette was  used to remove any remaining cartilage from the tibia. The tibial  cutting block was then placed and there was a perfect fit on the tibial  surface. The appropriate slope was placed and it was pinned in  position. The reciprocating saw was used to make the central cut and  then the oscillating saw used to make the horizontal cut. The bone  fragment was then removed. The tibial trial was placed and had perfect  fit on the tibia. We then drilled the 2 lug holes and did the keel punch.  We then placed tibia trial femur, and a 6 mm trial insert. There was  excellent stability throughout full range of motion and no impingement.  The trial was then removed. We drilled small holes in the distal  femur in order to create more conduits for the cement. The cut  bone  surfaces were thoroughly irrigated with pulsatile lavage while the  cement was mixed on the back table. We then cemented the tibial  component into place, impacted it and removed the extruded cement. The  same was done for the femoral component. Trial 6-mm inserts placed,  knee held in full extension, and all extruded cement removed. While  the  cement was hardening, I injected the extensor mechanism, periosteum of  the femur and subcu tissues, a total of 20 mL of Exparel mixed with 30  mL of saline and then did an additional injection of 20 mL of 0.25%  Marcaine into the same tissues. When the cement had fully hardened,  then the permanent polyethylene was placed in tibial tray. There was  excellent stability throughout full range of motion with no lift off the  component and no evidence of any impingement. Wound was copiously  irrigated with saline solution, and the arthrotomy closed over a Hemovac  drain with a running #1 V-Loc suture. The subcutaneous was closed with  interrupted 2-0 Vicryl and subcuticular running 4-0 Monocryl. The drain  was hooked to suction. Incision cleaned and dried and Steri-Strips and  a bulky sterile dressing applied. The tourniquet was released after a  total time of 43 minutes. This was done after closing the extensor  mechanism. The wound was closed and a bulky sterile dressing was  applied. She was placed into a knee immobilizer, awakened and  transported to recovery room in stable condition.  Please note that a surgical assistant was a medical necessity for this  procedure in order to perform it in a safe and expeditious manner.  Assistance was necessary for retracting vital ligaments, neurovascular  structures, as well as for proper positioning of the limb to allow for  appropriate bone cuts and appropriate placement of the prosthesis.    Dione Plover Jatoya Armbrister, MD

## 2014-12-31 NOTE — Interval H&P Note (Signed)
History and Physical Interval Note:  12/31/2014 12:34 PM  Crystal Day  has presented today for surgery, with the diagnosis of RIGHT KNEE MEDIAL COMPARTMENT OSTEOARTHRITIS  The various methods of treatment have been discussed with the patient and family. After consideration of risks, benefits and other options for treatment, the patient has consented to  Procedure(s): RIGHT KNEE MEDIAL UNICOMPARTMENTAL ARTHROPLASTY (Right) as a surgical intervention .  The patient's history has been reviewed, patient examined, no change in status, stable for surgery.  I have reviewed the patient's chart and labs.  Questions were answered to the patient's satisfaction.     [name]

## 2014-12-31 NOTE — Anesthesia Preprocedure Evaluation (Signed)
Anesthesia Evaluation  Patient identified by MRN, date of birth, ID band Patient awake    Reviewed: Allergy & Precautions, NPO status , Patient's Chart, lab work & pertinent test results  History of Anesthesia Complications (+) PONV  Airway Mallampati: I  TM Distance: >3 FB Neck ROM: Full    Dental   Pulmonary former smoker,    Pulmonary exam normal       Cardiovascular hypertension, Pt. on medications + Past MI Normal cardiovascular exam    Neuro/Psych    GI/Hepatic   Endo/Other    Renal/GU      Musculoskeletal   Abdominal   Peds  Hematology   Anesthesia Other Findings   Reproductive/Obstetrics                             Anesthesia Physical Anesthesia Plan  ASA: II  Anesthesia Plan: Spinal   Post-op Pain Management:    Induction: Intravenous  Airway Management Planned: Natural Airway  Additional Equipment:   Intra-op Plan:   Post-operative Plan:   Informed Consent: I have reviewed the patients History and Physical, chart, labs and discussed the procedure including the risks, benefits and alternatives for the proposed anesthesia with the patient or authorized representative who has indicated his/her understanding and acceptance.     Plan Discussed with: CRNA and Surgeon  Anesthesia Plan Comments:         Anesthesia Quick Evaluation

## 2014-12-31 NOTE — Anesthesia Procedure Notes (Addendum)
Procedure Name: MAC Date/Time: 12/31/2014 1:20 PM Performed by: Dione Booze Pre-anesthesia Checklist: Patient identified, Emergency Drugs available, Suction available and Patient being monitored Oxygen Delivery Method: Simple face mask Placement Confirmation: positive ETCO2   Spinal Patient location during procedure: OR Start time: 12/31/2014 1:20 PM End time: 12/31/2014 1:25 PM Staffing Anesthesiologist: Lillia Abed Performed by: anesthesiologist  Preanesthetic Checklist Completed: patient identified, site marked, surgical consent, pre-op evaluation, timeout performed, IV checked, risks and benefits discussed and monitors and equipment checked Spinal Block Patient position: sitting Prep: Betadine Patient monitoring: heart rate, cardiac monitor, continuous pulse ox and blood pressure Approach: right paramedian Location: L3-4 Injection technique: single-shot Needle Needle type: Sprotte  Needle gauge: 24 G Needle length: 9 cm Needle insertion depth: 6 cm Assessment Sensory level: T8

## 2015-01-01 ENCOUNTER — Encounter (HOSPITAL_COMMUNITY): Payer: Self-pay | Admitting: Orthopedic Surgery

## 2015-01-01 DIAGNOSIS — M1711 Unilateral primary osteoarthritis, right knee: Secondary | ICD-10-CM | POA: Diagnosis not present

## 2015-01-01 LAB — BASIC METABOLIC PANEL
Anion gap: 9 (ref 5–15)
BUN: 23 mg/dL — ABNORMAL HIGH (ref 6–20)
CO2: 26 mmol/L (ref 22–32)
Calcium: 8.7 mg/dL — ABNORMAL LOW (ref 8.9–10.3)
Chloride: 105 mmol/L (ref 101–111)
Creatinine, Ser: 1.25 mg/dL — ABNORMAL HIGH (ref 0.44–1.00)
GFR calc Af Amer: 46 mL/min — ABNORMAL LOW (ref >60)
GFR calc non Af Amer: 40 mL/min — ABNORMAL LOW (ref >60)
Glucose, Bld: 183 mg/dL — ABNORMAL HIGH (ref 65–99)
Potassium: 4.2 mmol/L (ref 3.5–5.1)
Sodium: 140 mmol/L (ref 135–145)

## 2015-01-01 LAB — CBC
HCT: 37.7 % (ref 36.0–46.0)
Hemoglobin: 12.1 g/dL (ref 12.0–15.0)
MCH: 28.4 pg (ref 26.0–34.0)
MCHC: 32.1 g/dL (ref 30.0–36.0)
MCV: 88.5 fL (ref 78.0–100.0)
Platelets: 149 10*3/uL — ABNORMAL LOW (ref 150–400)
RBC: 4.26 MIL/uL (ref 3.87–5.11)
RDW: 12.6 % (ref 11.5–15.5)
WBC: 8.4 10*3/uL (ref 4.0–10.5)

## 2015-01-01 MED ORDER — RIVAROXABAN 10 MG PO TABS: 10.0000 mg | ORAL_TABLET | Freq: Every day | ORAL | Status: DC

## 2015-01-01 MED ORDER — ONDANSETRON HCL 4 MG PO TABS: 4.0000 mg | ORAL_TABLET | Freq: Four times a day (QID) | ORAL | Status: DC | PRN

## 2015-01-01 MED ORDER — HYDROMORPHONE HCL 2 MG PO TABS: 2.0000 mg | ORAL_TABLET | ORAL | Status: DC | PRN

## 2015-01-01 MED ORDER — TRAMADOL HCL 50 MG PO TABS: 50.0000 mg | ORAL_TABLET | Freq: Four times a day (QID) | ORAL | Status: DC | PRN

## 2015-01-01 MED ORDER — METHOCARBAMOL 500 MG PO TABS: 500.0000 mg | ORAL_TABLET | Freq: Four times a day (QID) | ORAL | Status: DC | PRN

## 2015-01-01 NOTE — Care Management Note (Signed)
Case Management Note  Patient Details  Name: Crystal Day MRN: [id number] Date of Birth: 08/24/1934  Subjective/Objective:                   RIGHT KNEE MEDIAL UNICOMPARTMENTAL ARTHROPLASTY (Right) Action/Plan:  Discharge planning Expected Discharge Date:  01/01/15               Expected Discharge Plan:  Home w Home Health Services  In-House Referral:     Discharge planning Services  CM Consult  Post Acute Care Choice:  Home Health Choice offered to:  Patient  DME Arranged:    DME Agency:     HH Arranged:  PT HH Agency:  [facility]  Status of Service:  Completed, signed off  Medicare Important Message Given:    Date Medicare IM Given:    Medicare IM give by:    Date Additional Medicare IM Given:    Additional Medicare Important Message give by:     If discussed at Long Length of Stay Meetings, dates discussed:    Additional Comments: CM met with pt in room to offer choice of home health agency.  Pt chooses [facility] to render HHPT.  Address and contact information verified by pt.  No DME is needed as pt has both a rolling walker and 3n1.  No other CM needs were communicated.  ,  Christine, RN 01/01/2015, 10:04 AM  

## 2015-01-01 NOTE — Progress Notes (Signed)
Occupational Therapy Evaluation Patient Details Name: Crystal Day MRN: [id number] DOB: April 02, 1935 Today's Date: 01/01/2015    History of Present Illness 80 yo female s/p R unicompartmental knee replacement 12/31/14.    Clinical Impression   Patient admitted with above. Patient independent PTA. Patient currently functioning at an overall mod I > supervision level. D/C from acute OT services and no additional follow-up OT needs at this time. All appropriate education provided to patient. Please re-order OT if needed. Discussed use of reacher and LH sponge for LB ADLs and IADL tasks. Recommending patient use BSC in tub/shower for safe transfers, also recommending supervision at this time for shower transfers.       Follow Up Recommendations  No OT follow up;Supervision - Intermittent    Equipment Recommendations  Other (comment) (reacher and LH sponge)    Recommendations for Other Services  None at this time   Precautions / Restrictions Precautions Precautions: Fall;Knee Precaution Comments: reviewed no pillow under knee Restrictions Weight Bearing Restrictions: No RLE Weight Bearing: Weight bearing as tolerated      Mobility Bed Mobility Overal bed mobility: Needs Assistance Bed Mobility: Sit to Supine     Supine to sit: Supervision     General bed mobility comments: Supervision for overall safety. Patient able to manage RLE.   Transfers Overall transfer level: Needs assistance Equipment used: Rolling walker (2 wheeled) Transfers: Sit to/from Stand Sit to Stand: Supervision General transfer comment: Supervision for safety. No cues needed for technique or hand placement.     Balance Overall balance assessment: No apparent balance deficits (not formally assessed)    ADL Overall ADL's : Modified independent;Needs assistance/impaired General ADL Comments: Patient overall mod I > supervision for functional tasks (ADLs and transfers). Patient using RW. Educated patient  on use of AE (reacher and LH sponge) to increase independence with LB ADLs and IADLs. Patient states she lives alone, but has family and friends that can assist 24/7 post acute d/c. Recommending intermittent supervision for functional tasks and transfers. Patient ambulated into BR and practiced simulated tub/shower transfer using BSC. Patient states she has a BSC and recommending patient use this for tub/shower transfers. Patient able to reach BLEs for LB ADLs, LH sponge recommended for pain management and overall safety.     Pertinent Vitals/Pain Pain Assessment: 0-10 Pain Score: 2  Pain Location: right knee at rest Pain Descriptors / Indicators: Aching Pain Intervention(s): Monitored during session;Limited activity within patient's tolerance     Hand Dominance Right   Extremity/Trunk Assessment Upper Extremity Assessment Upper Extremity Assessment: Overall WFL for tasks assessed   Lower Extremity Assessment Lower Extremity Assessment: Defer to PT evaluation RLE Deficits / Details: At least 3/5 throughout-did not apply resistance   Cervical / Trunk Assessment Cervical / Trunk Assessment: Normal   Communication Communication Communication: No difficulties   Cognition Arousal/Alertness: Awake/alert Behavior During Therapy: WFL for tasks assessed/performed Overall Cognitive Status: Within Functional Limits for tasks assessed              Home Living Family/patient expects to be discharged to:: Private residence Living Arrangements: Alone Available Help at Discharge: Family;Friend(s);Available 24 hours/day (daughter and friends can assist 24/7 post acute d/c) Type of Home: House Home Access: Stairs to enter [employer] of Steps: 3 Entrance Stairs-Rails: None Home Layout: One level     Bathroom Shower/Tub: Tub/shower unit;Curtain   Biochemist, clinical: Standard     Home Equipment: Environmental consultant - 2 wheels;Bedside commode;Cane - single point    Prior  Functioning/Environment Level of Independence: Independent      OT Diagnosis:  n/a, no further acute OT needs identified    OT Problem List:   n/a, no further acute OT needs identified     OT Treatment/Interventions:   n/a, no further acute OT needs identified     OT Goals(Current goals can be found in the care plan section) Acute Rehab OT Goals Patient Stated Goal: go home today  OT Frequency:   n/a, no further acute OT needs identified     Barriers to D/C:  None known at this time   End of Session Equipment Utilized During Treatment: Rolling walker  Activity Tolerance: Patient tolerated treatment well Patient left: in bed;with call bell/phone within reach   Time: 1055-1111 OT Time Calculation (min): 16 min Charges:  OT General Charges $OT Visit: 1 Procedure OT Evaluation $Initial OT Evaluation Tier I: 1 Procedure G-Codes: OT G-codes **NOT FOR INPATIENT CLASS** Functional Limitation: Self care Self Care Current Status (G8366): At least 1 percent but less than 20 percent impaired, limited or restricted Self Care Goal Status (Q9476): At least 1 percent but less than 20 percent impaired, limited or restricted Self Care Discharge Status [phone]): At least 1 percent but less than 20 percent impaired, limited or restricted  Auston Halfmann , MS, OTR/L, CLT Pager: [phone]  01/01/2015, 11:21 AM

## 2015-01-01 NOTE — Discharge Instructions (Addendum)
Dr. Gaynelle Arabian Total Joint Specialist [facility] [address], [place], [place] [address] [phone]  UNI KNEE REPLACEMENT POSTOPERATIVE DIRECTIONS   Knee Rehabilitation, Guidelines Following Surgery  Results after knee surgery are often greatly improved when you follow the exercise, range of motion and muscle strengthening exercises prescribed by your doctor. Safety measures are also important to protect the knee from further injury. Any time any of these exercises cause you to have increased pain or swelling in your knee joint, decrease the amount until you are comfortable again and slowly increase them. If you have problems or questions, call your caregiver or physical therapist for advice.   HOME CARE INSTRUCTIONS  Remove items at home which could result in a fall. This includes throw rugs or furniture in walking pathways.   ICE to the affected knee every three hours for 30 minutes at a time and then as needed for pain and swelling.  Continue to use ice on the knee for pain and swelling from surgery. You may notice swelling that will progress down to the foot and ankle.  This is normal after surgery.  Elevate the leg when you are not up walking on it.    Continue to use the breathing machine which will help keep your temperature down.  It is common for your temperature to cycle up and down following surgery, especially at night when you are not up moving around and exerting yourself.  The breathing machine keeps your lungs expanded and your temperature down.  Do not place pillow under knee, focus on keeping the knee straight while resting  DIET You may resume your previous home diet once your are discharged from the hospital.  DRESSING / WOUND CARE / SHOWERING You may change your dressing 3-5 days after surgery.  Then change the dressing every day with sterile gauze.  Please use good hand washing techniques before changing the dressing.  Do not use any  lotions or creams on the incision until instructed by your surgeon. and You may shower 3 days after surgery, but keep the wounds dry during showering.  You may use an occlusive plastic wrap (Press'n Seal for example), NO SOAKING/SUBMERGING IN THE BATHTUB.  If the bandage gets wet, change with a clean dry gauze.  If the incision gets wet, pat the wound dry with a clean towel. You may start showering once you are discharged home but do not submerge the incision under water. Just pat the incision dry and apply a dry gauze dressing on daily. Change the surgical dressing daily and reapply a dry dressing each time.  ACTIVITY Walk with your walker as instructed. Use walker as long as suggested by your caregivers. Avoid periods of inactivity such as sitting longer than an hour when not asleep. This helps prevent blood clots.  You may resume a sexual relationship in one month or when given the OK by your doctor.  You may return to work once you are cleared by your doctor.  Do not drive a car for 6 weeks or until released by you surgeon.  Do not drive while taking narcotics.  WEIGHT BEARING Weight bearing as tolerated with assist device (walker, cane, etc) as directed, use it as long as suggested by your surgeon or therapist, typically at least 4-6 weeks.  POSTOPERATIVE CONSTIPATION PROTOCOL Constipation - defined medically as fewer than three stools per week and severe constipation as less than one stool per week.  One of the most common issues patients  have following surgery is constipation.  Even if you have a regular bowel pattern at home, your normal regimen is likely to be disrupted due to multiple reasons following surgery.  Combination of anesthesia, postoperative narcotics, change in appetite and fluid intake all can affect your bowels.  In order to avoid complications following surgery, here are some recommendations in order to help you during your recovery period.  Colace (docusate) - Pick up  an over-the-counter form of Colace or another stool softener and take twice a day as long as you are requiring postoperative pain medications.  Take with a full glass of water daily.  If you experience loose stools or diarrhea, hold the colace until you stool forms back up.  If your symptoms do not get better within 1 week or if they get worse, check with your doctor.  Dulcolax (bisacodyl) - Pick up over-the-counter and take as directed by the product packaging as needed to assist with the movement of your bowels.  Take with a full glass of water.  Use this product as needed if not relieved by Colace only.   MiraLax (polyethylene glycol) - Pick up over-the-counter to have on hand.  MiraLax is a solution that will increase the amount of water in your bowels to assist with bowel movements.  Take as directed and can mix with a glass of water, juice, soda, coffee, or tea.  Take if you go more than two days without a movement. Do not use MiraLax more than once per day. Call your doctor if you are still constipated or irregular after using this medication for 7 days in a row.  If you continue to have problems with postoperative constipation, please contact the office for further assistance and recommendations.  If you experience "the worst abdominal pain ever" or develop nausea or vomiting, please contact the office immediatly for further recommendations for treatment.  ITCHING  If you experience itching with your medications, try taking only a single pain pill, or even half a pain pill at a time.  You can also use Benadryl over the counter for itching or also to help with sleep.   TED HOSE STOCKINGS Wear the elastic stockings on both legs for three weeks following surgery during the day but you may remove then at night for sleeping.  MEDICATIONS See your medication summary on the After Visit Summary that the nursing staff will review with you prior to discharge.  You may have some home medications which  will be placed on hold until you complete the course of blood thinner medication.  It is important for you to complete the blood thinner medication as prescribed by your surgeon.  Continue your approved medications as instructed at time of discharge.  PRECAUTIONS If you experience chest pain or shortness of breath - call 911 immediately for transfer to the hospital emergency department.  If you develop a fever greater that 101 F, purulent drainage from wound, increased redness or drainage from wound, foul odor from the wound/dressing, or calf pain - CONTACT YOUR SURGEON.                                                   FOLLOW-UP APPOINTMENTS Make sure you keep all of your appointments after your operation with your surgeon and caregivers. You should call the office at the above phone number  and make an appointment for approximately two weeks after the date of your surgery or on the date instructed by your surgeon outlined in the "After Visit Summary".  RANGE OF MOTION AND STRENGTHENING EXERCISES  Rehabilitation of the knee is important following a knee injury or an operation. After just a few days of immobilization, the muscles of the thigh which control the knee become weakened and shrink (atrophy). Knee exercises are designed to build up the tone and strength of the thigh muscles and to improve knee motion. Often times heat used for twenty to thirty minutes before working out will loosen up your tissues and help with improving the range of motion but do not use heat for the first two weeks following surgery. These exercises can be done on a training (exercise) mat, on the floor, on a table or on a bed. Use what ever works the best and is most comfortable for you Knee exercises include:  Leg Lifts - While your knee is still immobilized in a splint or cast, you can do straight leg raises. Lift the leg to 60 degrees, hold for 3 sec, and slowly lower the leg. Repeat 10-20 times 2-3 times daily. Perform  this exercise against resistance later as your knee gets better.  Quad and Hamstring Sets - Tighten up the muscle on the front of the thigh (Quad) and hold for 5-10 sec. Repeat this 10-20 times hourly. Hamstring sets are done by pushing the foot backward against an object and holding for 5-10 sec. Repeat as with quad sets.   Leg Slides: Lying on your back, slowly slide your foot toward your buttocks, bending your knee up off the floor (only go as far as is comfortable). Then slowly slide your foot back down until your leg is flat on the floor again.  Angel Wings: Lying on your back spread your legs to the side as far apart as you can without causing discomfort.  A rehabilitation program following serious knee injuries can speed recovery and prevent re-injury in the future due to weakened muscles. Contact your doctor or a physical therapist for more information on knee rehabilitation.   IF YOU ARE TRANSFERRED TO A SKILLED REHAB FACILITY If the patient is transferred to a skilled rehab facility following release from the hospital, a list of the current medications will be sent to the facility for the patient to continue.  When discharged from the skilled rehab facility, please have the facility set up the patient's [place] prior to being released. Also, the skilled facility will be responsible for providing the patient with their medications at time of release from the facility to include their pain medication, the muscle relaxants, and their blood thinner medication. If the patient is still at the rehab facility at time of the two week follow up appointment, the skilled rehab facility will also need to assist the patient in arranging follow up appointment in our office and any transportation needs.  MAKE SURE YOU:  Understand these instructions.  Get help right away if you are not doing well or get worse.    Pick up stool softner and laxative for home use following surgery while  on pain medications. Do not submerge incision under water. Please use good hand washing techniques while changing dressing each day. May shower starting three days after surgery. Please use a clean towel to pat the incision dry following showers. Continue to use ice for pain and swelling after surgery. Do not use any lotions  or creams on the incision until instructed by your surgeon.  Take Xarelto for a total of ten days, then discontinue Xarelto. After completing the Xarelto, then resume the home Aspirin of 81 mg daily.    Information on my medicine - XARELTO (Rivaroxaban)  This medication education was reviewed with me or my healthcare representative as part of my discharge preparation.  The pharmacist that spoke with me during my hospital stay was:  Angela Adam [facility]  Why was Xarelto prescribed for you? Xarelto was prescribed for you to reduce the risk of blood clots forming after orthopedic surgery. The medical term for these abnormal blood clots is venous thromboembolism (VTE).  What do you need to know about xarelto ? Take your Xarelto ONCE DAILY at the same time every day. You may take it either with or without food.  If you have difficulty swallowing the tablet whole, you may crush it and mix in applesauce just prior to taking your dose.  Take Xarelto exactly as prescribed by your doctor and DO NOT stop taking Xarelto without talking to the doctor who prescribed the medication.  Stopping without other VTE prevention medication to take the place of Xarelto may increase your risk of developing a clot.  After discharge, you should have regular check-up appointments with your healthcare provider that is prescribing your Xarelto.    What do you do if you miss a dose? If you miss a dose, take it as soon as you remember on the same day then continue your regularly scheduled once daily regimen the next day. Do not take two doses of Xarelto on the same day.   Important  Safety Information A possible side effect of Xarelto is bleeding. You should call your healthcare provider right away if you experience any of the following: ? Bleeding from an injury or your nose that does not stop. ? Unusual colored urine (red or dark brown) or unusual colored stools (red or black). ? Unusual bruising for unknown reasons. ? A serious fall or if you hit your head (even if there is no bleeding).  Some medicines may interact with Xarelto and might increase your risk of bleeding while on Xarelto. To help avoid this, consult your healthcare provider or pharmacist prior to using any new prescription or non-prescription medications, including herbals, vitamins, non-steroidal anti-inflammatory drugs (NSAIDs) and supplements.  This website has more information on Xarelto: [email or web address]

## 2015-01-01 NOTE — Discharge Summary (Signed)
Physician Discharge Summary   Patient ID: Crystal Day MRN: [id number] DOB/AGE: 78-Sep-1936 78 y.o.  Admit date: 12/31/2014 Discharge date: 01-01-2015  Primary Diagnosis:  Medial compartment osteoarthritis, Right knee Admission Diagnoses:  Past Medical History  Diagnosis Date  . Hypertension   . Stroke 2009    residuals include numbness on right side of lips and right finger tips  . Anginal pain since 2014  . Pneumonia     "in my 20's"  . Headache(784.0)     "had migraines for 20 years, not anymore"  . Cancer 1988 or 89    basal cell carcinoma on face s/p Mohs procedure  . Arthritis     just in the hands  . Non-STEMI (non-ST elevated myocardial infarction)   . Hyperlipidemia   . Complication of anesthesia     nausea  . History of wheezing     in spring and fall  . PONV (postoperative nausea and vomiting)    Discharge Diagnoses:   Principal Problem:   OA (osteoarthritis) of knee  Estimated body mass index is 22.88 kg/(m^2) as calculated from the following:   Height as of this encounter: _0  (1.753 m).   Weight as of this encounter: 70.308 kg (155 lb).  Procedure:  Procedure(s) (LRB): RIGHT KNEE MEDIAL UNICOMPARTMENTAL ARTHROPLASTY (Right)   Consults: None  HPI: Crystal Day is a 80 y.o. female, who has  significant isolated medial compartment arthritis of the Right knee. She has had nonoperative management including injections. She has had  cortisone and viscous supplements. Unfortunately, the pain persists.  Radiograph showed isolated medial compartment bone-on-bone arthritis with osteochondral defect and  with normal-appearing patellofemoral and lateral compartments. She  presents now for left knee unicompartmental arthroplasty.   Laboratory Data: Admission on 12/31/2014  Component Date Value Ref Range Status  . WBC 01/01/2015 8.4  4.0 - 10.5 K/uL Final  . RBC 01/01/2015 4.26  3.87 - 5.11 MIL/uL Final  . Hemoglobin 01/01/2015 12.1  12.0 - 15.0 g/dL  Final  . HCT 01/01/2015 37.7  36.0 - 46.0 % Final  . MCV 01/01/2015 88.5  78.0 - 100.0 fL Final  . MCH 01/01/2015 28.4  26.0 - 34.0 pg Final  . MCHC 01/01/2015 32.1  30.0 - 36.0 g/dL Final  . RDW 01/01/2015 12.6  11.5 - 15.5 % Final  . Platelets 01/01/2015 149* 150 - 400 K/uL Final  . Sodium 01/01/2015 140  135 - 145 mmol/L Final  . Potassium 01/01/2015 4.2  3.5 - 5.1 mmol/L Final  . Chloride 01/01/2015 105  101 - 111 mmol/L Final  . CO2 01/01/2015 26  22 - 32 mmol/L Final  . Glucose, Bld 01/01/2015 183* 65 - 99 mg/dL Final  . BUN 01/01/2015 23* 6 - 20 mg/dL Final  . Creatinine, Ser 01/01/2015 1.25* 0.44 - 1.00 mg/dL Final  . Calcium 01/01/2015 8.7* 8.9 - 10.3 mg/dL Final  . GFR calc non Af Amer 01/01/2015 40* >60 mL/min Final  . GFR calc Af Amer 01/01/2015 46* >60 mL/min Final   Comment: (NOTE) The eGFR has been calculated using the CKD EPI equation. This calculation has not been validated in all clinical situations. eGFR's persistently <60 mL/min signify possible Chronic Kidney Disease.   Crystal Day gap 01/01/2015 9  5 - 15 Final  Hospital Outpatient Visit on 12/26/2014  Component Date Value Ref Range Status  . MRSA, PCR 12/26/2014 NEGATIVE  NEGATIVE Final  . Staphylococcus aureus 12/26/2014 NEGATIVE  NEGATIVE Final   Comment:  The Xpert SA Assay (FDA approved for NASAL specimens in patients over 55 years of age), is one component of a comprehensive surveillance program.  Test performance has been validated by [facility] for patients greater than or equal to 10 year old. It is not intended to diagnose infection nor to guide or monitor treatment.   Marland Kitchen aPTT 12/26/2014 27  24 - 37 seconds Final  . WBC 12/26/2014 4.8  4.0 - 10.5 K/uL Final  . RBC 12/26/2014 4.68  3.87 - 5.11 MIL/uL Final  . Hemoglobin 12/26/2014 13.3  12.0 - 15.0 g/dL Final  . HCT 12/26/2014 41.3  36.0 - 46.0 % Final  . MCV 12/26/2014 88.2  78.0 - 100.0 fL Final  . MCH 12/26/2014 28.4  26.0 - 34.0 pg  Final  . MCHC 12/26/2014 32.2  30.0 - 36.0 g/dL Final  . RDW 12/26/2014 12.6  11.5 - 15.5 % Final  . Platelets 12/26/2014 161  150 - 400 K/uL Final  . Sodium 12/26/2014 141  135 - 145 mmol/L Final  . Potassium 12/26/2014 4.3  3.5 - 5.1 mmol/L Final  . Chloride 12/26/2014 104  101 - 111 mmol/L Final  . CO2 12/26/2014 30  22 - 32 mmol/L Final  . Glucose, Bld 12/26/2014 104* 70 - 99 mg/dL Final  . BUN 12/26/2014 21* 6 - 20 mg/dL Final  . Creatinine, Ser 12/26/2014 1.17* 0.44 - 1.00 mg/dL Final  . Calcium 12/26/2014 9.7  8.9 - 10.3 mg/dL Final  . Total Protein 12/26/2014 7.3  6.5 - 8.1 g/dL Final  . Albumin 12/26/2014 4.3  3.5 - 5.0 g/dL Final  . AST 12/26/2014 24  15 - 41 U/L Final  . ALT 12/26/2014 11* 14 - 54 U/L Final  . Alkaline Phosphatase 12/26/2014 44  38 - 126 U/L Final  . Total Bilirubin 12/26/2014 0.8  0.3 - 1.2 mg/dL Final  . GFR calc non Af Amer 12/26/2014 43* >60 mL/min Final  . GFR calc Af Amer 12/26/2014 50* >60 mL/min Final   Comment: (NOTE) The eGFR has been calculated using the CKD EPI equation. This calculation has not been validated in all clinical situations. eGFR's persistently <60 mL/min signify possible Chronic Kidney Disease.   . Anion gap 12/26/2014 7  5 - 15 Final  . Prothrombin Time 12/26/2014 12.9  11.6 - 15.2 seconds Final  . INR 12/26/2014 0.96  0.00 - 1.49 Final  . ABO/RH(D) 12/26/2014 O POS   Final  . Antibody Screen 12/26/2014 NEG   Final  . Sample Expiration 12/26/2014 01/03/2015   Final  . Color, Urine 12/26/2014 YELLOW  YELLOW Final  . APPearance 12/26/2014 CLEAR  CLEAR Final  . Specific Gravity, Urine 12/26/2014 1.020  1.005 - 1.030 Final  . pH 12/26/2014 5.0  5.0 - 8.0 Final  . Glucose, UA 12/26/2014 NEGATIVE  NEGATIVE mg/dL Final  . Hgb urine dipstick 12/26/2014 NEGATIVE  NEGATIVE Final  . Bilirubin Urine 12/26/2014 NEGATIVE  NEGATIVE Final  . Ketones, ur 12/26/2014 NEGATIVE  NEGATIVE mg/dL Final  . Protein, ur 12/26/2014 NEGATIVE  NEGATIVE  mg/dL Final  . Urobilinogen, UA 12/26/2014 0.2  0.0 - 1.0 mg/dL Final  . Nitrite 12/26/2014 NEGATIVE  NEGATIVE Final  . Leukocytes, UA 12/26/2014 NEGATIVE  NEGATIVE Final   MICROSCOPIC NOT DONE ON URINES WITH NEGATIVE PROTEIN, BLOOD, LEUKOCYTES, NITRITE, OR GLUCOSE <1000 mg/dL.  . ABO/RH(D) 12/26/2014 O POS   Final     X-Rays:No results found.  EKG: Orders placed or performed in visit on  10/25/14  . Cardiac event monitor     Hospital Course: Crystal Day is a 80 y.o. who was admitted to [facility]. They were brought to the operating room on 12/31/2014 and underwent Procedure(s): RIGHT KNEE MEDIAL UNICOMPARTMENTAL ARTHROPLASTY.  Patient tolerated the procedure well and was later transferred to the recovery room and then to the orthopaedic floor for postoperative care.  They were given PO and IV analgesics for pain control following their surgery.  They were given 24 hours of postoperative antibiotics of  Anti-infectives    Start     Dose/Rate Route Frequency Ordered Stop   01/01/15 0030  vancomycin (VANCOCIN) IVPB 1000 mg/200 mL premix     1,000 mg 200 mL/hr over 60 Minutes Intravenous  Once 12/31/14 1715 01/01/15 0112   12/31/14 1930  ceFAZolin (ANCEF) IVPB 2 g/50 mL premix  Status:  Discontinued     2 g 100 mL/hr over 30 Minutes Intravenous Every 6 hours 12/31/14 1641 12/31/14 1713   12/31/14 1112  vancomycin (VANCOCIN) IVPB 1000 mg/200 mL premix     1,000 mg 200 mL/hr over 60 Minutes Intravenous On call to O.R. 12/31/14 1112 12/31/14 1315     and started on DVT prophylaxis in the form of Xarelto.   PT and OT were ordered for total joint protocol.  Discharge planning consulted to help with postop disposition and equipment needs.  Patient had a tough night on the evening of surgery with nausea and not much sleep.  They started to get up OOB with therapy on day one. Hemovac drain was pulled without difficulty. Pain meds were changed. Patient was seen in rounds by Dr. Maureen Ralphs and  was ready to go home as long as they did well with therapy sessions.  Arrangements and RXs were setup for discharge later that same day.  Discharge home with home health Diet - Cardiac diet Follow up - in 2 weeks Activity - WBAT Disposition - Home Condition Upon Discharge - Stable D/C Meds - See DC Summary DVT Prophylaxis - Xarelto  Discharge Instructions    Call MD / Call 911    Complete by:  As directed   If you experience chest pain or shortness of breath, CALL 911 and be transported to the hospital emergency room.  If you develope a fever above 101 F, pus (white drainage) or increased drainage or redness at the wound, or calf pain, call your surgeon's office.     Change dressing    Complete by:  As directed   Change dressing daily with sterile 4 x 4 inch gauze dressing and apply TED hose. Do not submerge the incision under water.     Constipation Prevention    Complete by:  As directed   Drink plenty of fluids.  Prune juice may be helpful.  You may use a stool softener, such as Colace (over the counter) 100 mg twice a day.  Use MiraLax (over the counter) for constipation as needed.     Diet - low sodium heart healthy    Complete by:  As directed      Discharge instructions    Complete by:  As directed   Pick up stool softner and laxative for home use following surgery while on pain medications. Do not submerge incision under water. Please use good hand washing techniques while changing dressing each day. May shower starting three days after surgery. Please use a clean towel to pat the incision dry following showers. Continue to use ice  for pain and swelling after surgery. Do not use any lotions or creams on the incision until instructed by your surgeon.  Take Xarelto for ten more days, then discontinue Xarelto. Once the patient has completed the Xarelto, they may resume the 81 mg Aspirin.  Postoperative Constipation Protocol  Constipation - defined medically as fewer than  three stools per week and severe constipation as less than one stool per week.  One of the most common issues patients have following surgery is constipation.  Even if you have a regular bowel pattern at home, your normal regimen is likely to be disrupted due to multiple reasons following surgery.  Combination of anesthesia, postoperative narcotics, change in appetite and fluid intake all can affect your bowels.  In order to avoid complications following surgery, here are some recommendations in order to help you during your recovery period.  Colace (docusate) - Pick up an over-the-counter form of Colace or another stool softener and take twice a day as long as you are requiring postoperative pain medications.  Take with a full glass of water daily.  If you experience loose stools or diarrhea, hold the colace until you stool forms back up.  If your symptoms do not get better within 1 week or if they get worse, check with your doctor.  Dulcolax (bisacodyl) - Pick up over-the-counter and take as directed by the product packaging as needed to assist with the movement of your bowels.  Take with a full glass of water.  Use this product as needed if not relieved by Colace only.   MiraLax (polyethylene glycol) - Pick up over-the-counter to have on hand.  MiraLax is a solution that will increase the amount of water in your bowels to assist with bowel movements.  Take as directed and can mix with a glass of water, juice, soda, coffee, or tea.  Take if you go more than two days without a movement. Do not use MiraLax more than once per day. Call your doctor if you are still constipated or irregular after using this medication for 7 days in a row.  If you continue to have problems with postoperative constipation, please contact the office for further assistance and recommendations.  If you experience "the worst abdominal pain ever" or develop nausea or vomiting, please contact the office immediatly for further  recommendations for treatment.     Do not put a pillow under the knee. Place it under the heel.    Complete by:  As directed      Do not sit on low chairs, stoools or toilet seats, as it may be difficult to get up from low surfaces    Complete by:  As directed      Driving restrictions    Complete by:  As directed   No driving until released by the physician.     Increase activity slowly as tolerated    Complete by:  As directed      Lifting restrictions    Complete by:  As directed   No lifting until released by the physician.     Patient may shower    Complete by:  As directed   You may shower without a dressing once there is no drainage.  Do not wash over the wound.  If drainage remains, do not shower until drainage stops.     TED hose    Complete by:  As directed   Use stockings (TED hose) for 3 weeks on both leg(s).  You  may remove them at night for sleeping.     Weight bearing as tolerated    Complete by:  As directed   Laterality:  right  Extremity:  Lower            Medication List    STOP taking these medications        aspirin 81 MG EC tablet     Red Yeast Rice 600 MG Caps      TAKE these medications        calcium gluconate 500 MG tablet  Take 1 tablet by mouth daily.     hydrochlorothiazide 25 MG tablet  Commonly known as:  HYDRODIURIL  Take 25 mg by mouth every morning.     HYDROmorphone 2 MG tablet  Commonly known as:  DILAUDID  Take 1-2 tablets (2-4 mg total) by mouth every 3 (three) hours as needed for moderate pain or severe pain.     methocarbamol 500 MG tablet  Commonly known as:  ROBAXIN  Take 1 tablet (500 mg total) by mouth every 6 (six) hours as needed for muscle spasms.     metoprolol tartrate 25 MG tablet  Commonly known as:  LOPRESSOR  Take 1 tablet (25 mg total) by mouth 2 (two) times daily.     nitroGLYCERIN 0.4 MG SL tablet  Commonly known as:  NITROSTAT  Place 1 tablet (0.4 mg total) under the tongue every 5 (five) minutes as  needed for chest pain.     ondansetron 4 MG tablet  Commonly known as:  ZOFRAN  Take 1 tablet (4 mg total) by mouth every 6 (six) hours as needed for nausea.     pravastatin 40 MG tablet  Commonly known as:  PRAVACHOL  Take 40 mg by mouth every other day.     rivaroxaban 10 MG Tabs tablet  Commonly known as:  XARELTO  - Take 1 tablet (10 mg total) by mouth daily with breakfast. Take Xarelto for a total of ten days, then discontinue Xarelto.  - Once the patient has completed the Xarelto, they may resume the 81 mg Aspirin.     traMADol 50 MG tablet  Commonly known as:  ULTRAM  Take 1-2 tablets (50-100 mg total) by mouth every 6 (six) hours as needed (mild to moderate pain).     valsartan 320 MG tablet  Commonly known as:  DIOVAN  Take 320 mg by mouth every morning.     valsartan-hydrochlorothiazide 320-25 MG per tablet  Commonly known as:  DIOVAN HCT  Take 1 tablet by mouth daily.           Follow-up Information    Follow up with [name], MD. Schedule an appointment as soon as possible for a visit on 01/15/2015.   Specialty:  Orthopedic Surgery   Why:  Call office at [phone] to setup appointment on Tuesday 01/15/2015 with Dr. Denman George information:   [address] [place] [address] [phone]       Signed: Arlee Muslim, PA-C Orthopaedic Surgery 01/01/2015, 8:02 AM

## 2015-01-01 NOTE — Evaluation (Signed)
Physical Therapy Evaluation Patient Details Name: Crystal Day MRN: [id number] DOB: July 25, 1935 Today's Date: 01/01/2015   History of Present Illness  80 yo female s/p R unicompartmental knee replacement 12/31/14.   Clinical Impression  On eval, pt was Min guard assist for mobility-able to ambulate ~100 feet with RW. Pain rated 4/10 with activity. Pt tolerated activity well. Plan is for d/c later today. Will plan to have a 2nd session to practice steps.     Follow Up Recommendations Home health PT    Equipment Recommendations  None recommended by PT    Recommendations for Other Services       Precautions / Restrictions Precautions Precautions: Fall;Knee Restrictions Weight Bearing Restrictions: No RLE Weight Bearing: Weight bearing as tolerated      Mobility  Bed Mobility Overal bed mobility: Needs Assistance Bed Mobility: Supine to Sit     Supine to sit: Supervision     General bed mobility comments: supervision for safety/lines. Pt c/o some dizziness/lightheadedness.   Transfers Overall transfer level: Needs assistance Equipment used: Rolling walker (2 wheeled) Transfers: Sit to/from Stand Sit to Stand: Min guard         General transfer comment: close guard for safety. VC safety, hand/LE placement  Ambulation/Gait Ambulation/Gait assistance: Min guard Ambulation Distance (Feet): 100 Feet Assistive device: Rolling walker (2 wheeled) Gait Pattern/deviations: Step-to pattern;Step-through pattern;Antalgic     General Gait Details: VCs safety, sequence. close guard for safety.   Stairs            Wheelchair Mobility    Modified Rankin (Stroke Patients Only)       Balance                                             Pertinent Vitals/Pain Pain Assessment: 0-10 Pain Score: 4  Pain Location: R knee with activity. 1/10 at rest.  Pain Descriptors / Indicators: Aching Pain Intervention(s): Ice applied;Monitored during  session;Repositioned    Home Living Family/patient expects to be discharged to:: Private residence Living Arrangements: Alone Available Help at Discharge: Family;Friend(s) Type of Home: House Home Access: Stairs to enter Entrance Stairs-Rails: None Entrance Stairs-Number of Steps: 3 Home Layout: One level Home Equipment: Environmental consultant - 2 wheels;Bedside commode;Cane - single point      Prior Function Level of Independence: Independent               Hand Dominance        Extremity/Trunk Assessment   Upper Extremity Assessment: Defer to OT evaluation           Lower Extremity Assessment: RLE deficits/detail RLE Deficits / Details: At least 3/5 throughout-did not apply resistance    Cervical / Trunk Assessment: Normal  Communication   Communication: No difficulties  Cognition Arousal/Alertness: Awake/alert Behavior During Therapy: WFL for tasks assessed/performed Overall Cognitive Status: Within Functional Limits for tasks assessed                      General Comments      Exercises Total Joint Exercises Ankle Circles/Pumps: AROM;Both;10 reps;Supine Quad Sets: AROM;Both;10 reps;Supine Hip ABduction/ADduction: AROM;Right;10 reps;Supine Straight Leg Raises: AROM;Right;10 reps;Supine Knee Flexion: AAROM;Right;5 reps;Seated Goniometric ROM: ~5-90 degrees      Assessment/Plan    PT Assessment Patient needs continued PT services  PT Diagnosis Difficulty walking;Acute pain   PT Problem List Decreased strength;Decreased range of  motion;Decreased activity tolerance;Decreased balance;Decreased mobility;Decreased knowledge of use of DME;Decreased knowledge of precautions;Pain  PT Treatment Interventions Gait training;DME instruction;Stair training;Functional mobility training;Therapeutic activities;Therapeutic exercise;Patient/family education;Balance training   PT Goals (Current goals can be found in the Care Plan section) Acute Rehab PT Goals Patient Stated  Goal: regain independence.  PT Goal Formulation: With patient Time For Goal Achievement: 01/08/15 Potential to Achieve Goals: Good    Frequency BID   Barriers to discharge        Co-evaluation               End of Session Equipment Utilized During Treatment: Gait belt Activity Tolerance: Patient tolerated treatment well Patient left: in chair;with call bell/phone within reach      Functional Assessment Tool Used: clinical judgement Functional Limitation: Mobility: Walking and moving around Mobility: Walking and Moving Around Current Status (V8938): At least 1 percent but less than 20 percent impaired, limited or restricted Mobility: Walking and Moving Around Goal Status [phone]): At least 1 percent but less than 20 percent impaired, limited or restricted    Time: 0901-0932 PT Time Calculation (min) (ACUTE ONLY): 31 min   Charges:   PT Evaluation $Initial PT Evaluation Tier I: 1 Procedure PT Treatments $Gait Training: 8-22 mins   PT G Codes:   PT G-Codes **NOT FOR INPATIENT CLASS** Functional Assessment Tool Used: clinical judgement Functional Limitation: Mobility: Walking and moving around Mobility: Walking and Moving Around Current Status (Z0258): At least 1 percent but less than 20 percent impaired, limited or restricted Mobility: Walking and Moving Around Goal Status [phone]): At least 1 percent but less than 20 percent impaired, limited or restricted    Crystal Day, MPT Pager: [phone]

## 2015-01-01 NOTE — Progress Notes (Signed)
   Subjective: 1 Day Post-Op Procedure(s) (LRB): RIGHT KNEE MEDIAL UNICOMPARTMENTAL ARTHROPLASTY (Right) Patient reports pain as mild.   Patient seen in rounds with Dr. Wynelle Link.  Nauseated with meds.  Changed oral pain med. Already doing SLRs. Will work with PT Patient is well, but has had some minor complaints of pain in the knee, requiring pain medications Patient is ready to go home later today after therapy sessions.  Objective: Vital signs in last 24 hours: Temp:  [96.8 F (36 C)-97.9 F (36.6 C)] 96.8 F (36 C) (05/17 0559) Pulse Rate:  [66-81] 70 (05/17 0559) Resp:  [15-18] 18 (05/17 0559) BP: (116-157)/(54-76) 134/69 mmHg (05/17 0559) SpO2:  [98 %-100 %] 100 % (05/17 0559) Weight:  [70.308 kg (155 lb)] 70.308 kg (155 lb) (05/16 1128)  Intake/Output from previous day:  Intake/Output Summary (Last 24 hours) at 01/01/15 0748 Last data filed at 01/01/15 0600  Gross per 24 hour  Intake 4055.75 ml  Output   2005 ml  Net 2050.75 ml    Intake/Output this shift: UOP 725 since around MN  Labs:  Recent Labs  01/01/15 0522  HGB 12.1    Recent Labs  01/01/15 0522  WBC 8.4  RBC 4.26  HCT 37.7  PLT 149*    Recent Labs  01/01/15 0522  NA 140  K 4.2  CL 105  CO2 26  BUN 23*  CREATININE 1.25*  GLUCOSE 183*  CALCIUM 8.7*   No results for input(s): LABPT, INR in the last 72 hours.  EXAM: General - Patient is Alert, Appropriate and Oriented Extremity - Neurovascular intact Sensation intact distally Dressing - clean, dry, no drainage Motor Function - intact, moving foot and toes well on exam.   Assessment/Plan: 1 Day Post-Op Procedure(s) (LRB): RIGHT KNEE MEDIAL UNICOMPARTMENTAL ARTHROPLASTY (Right) Procedure(s) (LRB): RIGHT KNEE MEDIAL UNICOMPARTMENTAL ARTHROPLASTY (Right) Past Medical History  Diagnosis Date  . Hypertension   . Stroke 2009    residuals include numbness on right side of lips and right finger tips  . Anginal pain since 2014  .  Pneumonia     "in my 20's"  . Headache(784.0)     "had migraines for 20 years, not anymore"  . Cancer 1988 or 89    basal cell carcinoma on face s/p Mohs procedure  . Arthritis     just in the hands  . Non-STEMI (non-ST elevated myocardial infarction)   . Hyperlipidemia   . Complication of anesthesia     nausea  . History of wheezing     in spring and fall  . PONV (postoperative nausea and vomiting)    Principal Problem:   OA (osteoarthritis) of knee  Estimated body mass index is 22.88 kg/(m^2) as calculated from the following:   Height as of this encounter: 5\' 9"  (1.753 m).   Weight as of this encounter: 70.308 kg (155 lb). Up with therapy Discharge home with home health Diet - Cardiac diet Follow up - in 2 weeks Activity - WBAT Disposition - Home Condition Upon Discharge - Stable D/C Meds - See DC Summary DVT Prophylaxis - Xarelto  Arlee Muslim, PA-C Orthopaedic Surgery 01/01/2015, 7:48 AM

## 2015-01-01 NOTE — Progress Notes (Signed)
Physical Therapy Treatment Patient Details Name: Crystal Day MRN: [id number] DOB: Aug 04, 1935 Today's Date: 01/01/2015    History of Present Illness 80 yo female s/p R unicompartmental knee replacement 12/31/14.     PT Comments    Progressing well with mobility. All education completed.   Follow Up Recommendations  Home health PT     Equipment Recommendations  None recommended by PT    Recommendations for Other Services       Precautions / Restrictions Precautions Precautions: Fall;Knee Restrictions Weight Bearing Restrictions: No RLE Weight Bearing: Weight bearing as tolerated    Mobility  Bed Mobility               General bed mobility comments: pt oob in recliner  Transfers   Equipment used: Rolling walker (2 wheeled) Transfers: Sit to/from Stand Sit to Stand: Supervision         General transfer comment: Supervision for safety.   Ambulation/Gait Ambulation/Gait assistance: Min guard Ambulation Distance (Feet): 150 Feet Assistive device: Rolling walker (2 wheeled) Gait Pattern/deviations: Step-to pattern;Step-through pattern;Antalgic     General Gait Details: VCs safety, sequence. close guard for safety.    Stairs Stairs: Yes Stairs assistance: Min assist Stair Management: Step to pattern;Forwards Number of Stairs: 2 General stair comments: 1 HHA for stair negotiation. VCs safety, sequence.   Wheelchair Mobility    Modified Rankin (Stroke Patients Only)       Balance                                    Cognition Arousal/Alertness: Awake/alert Behavior During Therapy: WFL for tasks assessed/performed Overall Cognitive Status: Within Functional Limits for tasks assessed                      Exercises      General Comments        Pertinent Vitals/Pain Pain Assessment: 0-10 Pain Score: 2  Pain Location: R knee Pain Descriptors / Indicators: Aching;Sore Pain Intervention(s): Monitored during  session;Repositioned    Home Living                      Prior Function            PT Goals (current goals can now be found in the care plan section) Progress towards PT goals: Progressing toward goals    Frequency       PT Plan Current plan remains appropriate    Co-evaluation             End of Session   Activity Tolerance: Patient tolerated treatment well Patient left: in chair;with call bell/phone within reach;with family/visitor present     Time: 2409-7353 PT Time Calculation (min) (ACUTE ONLY): 13 min  Charges:  $Gait Training: 8-22 mins                    G Codes:  Functional Assessment Tool Used: clinical judgement Functional Limitation: Mobility: Walking and moving around Mobility: Walking and Moving Around Goal Status [phone]): At least 1 percent but less than 20 percent impaired, limited or restricted Mobility: Walking and Moving Around Discharge Status [phone]): At least 1 percent but less than 20 percent impaired, limited or restricted   [name], MPT Pager: [phone]

## 2015-10-02 ENCOUNTER — Emergency Department (HOSPITAL_COMMUNITY): Payer: Medicare Other

## 2015-10-02 ENCOUNTER — Inpatient Hospital Stay (HOSPITAL_COMMUNITY)
Admission: EM | Admit: 2015-10-02 | Discharge: 2015-10-04 | DRG: 066 | Disposition: A | Discharge status: Home / Self Care | Payer: Medicare Other | Attending: Internal Medicine | Admitting: Internal Medicine

## 2015-10-02 ENCOUNTER — Encounter (HOSPITAL_COMMUNITY): Payer: Self-pay | Admitting: Vascular Surgery

## 2015-10-02 DIAGNOSIS — E785 Hyperlipidemia, unspecified: Secondary | ICD-10-CM | POA: Diagnosis present

## 2015-10-02 DIAGNOSIS — Z888 Allergy status to other drugs, medicaments and biological substances status: Secondary | ICD-10-CM

## 2015-10-02 DIAGNOSIS — I63212 Cerebral infarction due to unspecified occlusion or stenosis of left vertebral arteries: Secondary | ICD-10-CM

## 2015-10-02 DIAGNOSIS — I10 Essential (primary) hypertension: Secondary | ICD-10-CM | POA: Diagnosis present

## 2015-10-02 DIAGNOSIS — I639 Cerebral infarction, unspecified: Secondary | ICD-10-CM | POA: Diagnosis not present

## 2015-10-02 DIAGNOSIS — I6381 Other cerebral infarction due to occlusion or stenosis of small artery: Secondary | ICD-10-CM | POA: Diagnosis present

## 2015-10-02 DIAGNOSIS — N183 Chronic kidney disease, stage 3 unspecified: Secondary | ICD-10-CM | POA: Diagnosis present

## 2015-10-02 DIAGNOSIS — Z85828 Personal history of other malignant neoplasm of skin: Secondary | ICD-10-CM

## 2015-10-02 DIAGNOSIS — I129 Hypertensive chronic kidney disease with stage 1 through stage 4 chronic kidney disease, or unspecified chronic kidney disease: Secondary | ICD-10-CM | POA: Diagnosis present

## 2015-10-02 DIAGNOSIS — Z87891 Personal history of nicotine dependence: Secondary | ICD-10-CM

## 2015-10-02 DIAGNOSIS — Z880 Allergy status to penicillin: Secondary | ICD-10-CM

## 2015-10-02 DIAGNOSIS — I69398 Other sequelae of cerebral infarction: Secondary | ICD-10-CM

## 2015-10-02 DIAGNOSIS — I252 Old myocardial infarction: Secondary | ICD-10-CM

## 2015-10-02 DIAGNOSIS — G459 Transient cerebral ischemic attack, unspecified: Secondary | ICD-10-CM | POA: Insufficient documentation

## 2015-10-02 DIAGNOSIS — I2510 Atherosclerotic heart disease of native coronary artery without angina pectoris: Secondary | ICD-10-CM | POA: Diagnosis present

## 2015-10-02 DIAGNOSIS — I6322 Cerebral infarction due to unspecified occlusion or stenosis of basilar arteries: Secondary | ICD-10-CM

## 2015-10-02 DIAGNOSIS — R200 Anesthesia of skin: Secondary | ICD-10-CM | POA: Diagnosis present

## 2015-10-02 LAB — DIFFERENTIAL
Basophils Absolute: 0.1 10*3/uL (ref 0.0–0.1)
Basophils Relative: 1 %
Eosinophils Absolute: 0.2 10*3/uL (ref 0.0–0.7)
Eosinophils Relative: 3 %
Lymphocytes Relative: 27 %
Lymphs Abs: 1.5 10*3/uL (ref 0.7–4.0)
Monocytes Absolute: 0.5 10*3/uL (ref 0.1–1.0)
Monocytes Relative: 8 %
Neutro Abs: 3.3 10*3/uL (ref 1.7–7.7)
Neutrophils Relative %: 61 %

## 2015-10-02 LAB — COMPREHENSIVE METABOLIC PANEL
ALT: 12 U/L — ABNORMAL LOW (ref 14–54)
AST: 24 U/L (ref 15–41)
Albumin: 3.6 g/dL (ref 3.5–5.0)
Alkaline Phosphatase: 41 U/L (ref 38–126)
Anion gap: 8 (ref 5–15)
BUN: 19 mg/dL (ref 6–20)
CO2: 30 mmol/L (ref 22–32)
Calcium: 9.3 mg/dL (ref 8.9–10.3)
Chloride: 105 mmol/L (ref 101–111)
Creatinine, Ser: 1.31 mg/dL — ABNORMAL HIGH (ref 0.44–1.00)
GFR calc Af Amer: 44 mL/min — ABNORMAL LOW (ref >60)
GFR calc non Af Amer: 38 mL/min — ABNORMAL LOW (ref >60)
Glucose, Bld: 102 mg/dL — ABNORMAL HIGH (ref 65–99)
Potassium: 3.9 mmol/L (ref 3.5–5.1)
Sodium: 143 mmol/L (ref 135–145)
Total Bilirubin: 0.8 mg/dL (ref 0.3–1.2)
Total Protein: 6.5 g/dL (ref 6.5–8.1)

## 2015-10-02 LAB — I-STAT TROPONIN, ED: Troponin i, poc: 0.01 ng/mL (ref 0.00–0.08)

## 2015-10-02 LAB — CBC
HCT: 40.8 % (ref 36.0–46.0)
Hemoglobin: 13.6 g/dL (ref 12.0–15.0)
MCH: 29.3 pg (ref 26.0–34.0)
MCHC: 33.3 g/dL (ref 30.0–36.0)
MCV: 87.9 fL (ref 78.0–100.0)
Platelets: 171 10*3/uL (ref 150–400)
RBC: 4.64 MIL/uL (ref 3.87–5.11)
RDW: 13.6 % (ref 11.5–15.5)
WBC: 5.5 10*3/uL (ref 4.0–10.5)

## 2015-10-02 LAB — I-STAT CHEM 8, ED
BUN: 23 mg/dL — ABNORMAL HIGH (ref 6–20)
Calcium, Ion: 1.21 mmol/L (ref 1.13–1.30)
Chloride: 100 mmol/L — ABNORMAL LOW (ref 101–111)
Creatinine, Ser: 1.3 mg/dL — ABNORMAL HIGH (ref 0.44–1.00)
Glucose, Bld: 98 mg/dL (ref 65–99)
HCT: 43 % (ref 36.0–46.0)
Hemoglobin: 14.6 g/dL (ref 12.0–15.0)
Potassium: 3.8 mmol/L (ref 3.5–5.1)
Sodium: 143 mmol/L (ref 135–145)
TCO2: 31 mmol/L (ref 0–100)

## 2015-10-02 LAB — PROTIME-INR
INR: 1.02 (ref 0.00–1.49)
Prothrombin Time: 13.6 seconds (ref 11.6–15.2)

## 2015-10-02 LAB — APTT: aPTT: 27 seconds (ref 24–37)

## 2015-10-02 MED ORDER — LORAZEPAM 2 MG/ML IJ SOLN
1.0000 mg | Freq: Once | INTRAMUSCULAR | Status: AC
  Administered 2015-10-02: 1 mg via INTRAVENOUS
  Filled 2015-10-02: qty 1

## 2015-10-02 NOTE — ED Notes (Signed)
Patient transported to MRI 

## 2015-10-02 NOTE — ED Provider Notes (Signed)
CSN: [id number]     Arrival date & time 10/02/15  1428 History   First MD Initiated Contact with Patient 10/02/15 1922     Chief Complaint  Patient presents with  . Cerebrovascular Accident     (Consider location/radiation/quality/duration/timing/severity/associated sxs/prior Treatment) HPI Comments: Here for memory loss. She reports she had a dream during the night of 2/13 where she was having memory problems. LKN on 2230 on 2/13.Woke up the next day and noticed that she could not remember phone numbers that uses on a daily basis. Pt cannot remember how to retrieve her messages or numbers from her phone. She has hx of stroke. Denies any pain or HA. No problems walking, talking, unilateral weakness, dizziness, lightheadedness, or vision changes.  Patient is a 81 y.o. female presenting with neurologic complaint.  Neurologic Problem This is a new problem. The current episode started yesterday. The problem occurs constantly. The problem has been unchanged. Pertinent negatives include no abdominal pain, chest pain, chills, congestion, coughing, fatigue, fever, headaches, nausea, neck pain, numbness, rash, sore throat, urinary symptoms, vertigo, visual change, vomiting or weakness. Nothing aggravates the symptoms. She has tried nothing for the symptoms.    Past Medical History  Diagnosis Date  . Hypertension   . Stroke [facility]) 2009    residuals include numbness on right side of lips and right finger tips  . Anginal pain ([place]) since 2014  . Pneumonia     "in my 20's"  . Headache(784.0)     "had migraines for 20 years, not anymore"  . Cancer ([place]) 1988 or 89    basal cell carcinoma on face s/p Mohs procedure  . Arthritis     just in the hands  . Non-STEMI (non-ST elevated myocardial infarction) ([place])   . Hyperlipidemia   . Complication of anesthesia     nausea  . History of wheezing     in spring and fall  . PONV (postoperative nausea and vomiting)    Past Surgical History  Procedure  Laterality Date  . Breast surgery Left 1976    lumpectomy  . Abdominal hysterectomy  1974  . Fracture surgery Right 1971    thumb and index finger  . Left heart catheterization with coronary angiogram N/A 09/29/2013    Procedure: LEFT HEART CATHETERIZATION WITH CORONARY ANGIOGRAM;  Surgeon: Peter M Martinique, MD;  Location: [facility] CATH LAB;  Service: Cardiovascular;  Laterality: N/A;  . Eye surgery      cataract surgery, both eyes  . Partial knee arthroplasty Right 12/31/2014    Procedure: RIGHT KNEE MEDIAL UNICOMPARTMENTAL ARTHROPLASTY;  Surgeon: Gaynelle Arabian, MD;  Location: [facility] ORS;  Service: Orthopedics;  Laterality: Right;   Family History  Problem Relation Age of Onset  . Heart attack Father 13  . Cancer Mother   . Stroke Brother   . Stroke Paternal Grandmother   . Stroke Paternal Grandfather   . Heart attack Maternal Grandfather    Social History  Substance Use Topics  . Smoking status: Former Smoker -- 0.25 packs/day for 5 years    Quit date: 08/17/1981  . Smokeless tobacco: Never Used  . Alcohol Use: No   OB History    No data available     Review of Systems  Constitutional: Negative for fever, chills, appetite change and fatigue.  HENT: Negative for congestion, ear pain, facial swelling, mouth sores and sore throat.   Eyes: Negative for visual disturbance.  Respiratory: Negative for cough, chest tightness and shortness of breath.  Cardiovascular: Negative for chest pain and palpitations.  Gastrointestinal: Negative for nausea, vomiting, abdominal pain, diarrhea and blood in stool.  Endocrine: Negative for cold intolerance and heat intolerance.  Genitourinary: Negative for frequency, decreased urine volume and difficulty urinating.  Musculoskeletal: Negative for back pain, neck pain and neck stiffness.  Skin: Negative for rash.  Neurological: Negative for dizziness, vertigo, weakness, light-headedness, numbness and headaches.  All other systems reviewed and are  negative.     Allergies  Penicillins; Tetracyclines & related; Elavil; Erythromycin; and Macrodantin  Home Medications   Prior to Admission medications   Medication Sig Start Date End Date Taking? Authorizing Provider  calcium gluconate 500 MG tablet Take 1 tablet by mouth daily.   Yes Historical Provider, MD  hydrochlorothiazide (HYDRODIURIL) 25 MG tablet Take 25 mg by mouth every morning.   Yes Historical Provider, MD  metoprolol tartrate (LOPRESSOR) 25 MG tablet Take 1 tablet (25 mg total) by mouth 2 (two) times daily. Patient taking differently: Take 25 mg by mouth daily.  09/30/13  Yes Brett Canales, PA-C  nitroGLYCERIN (NITROSTAT) 0.4 MG SL tablet Place 1 tablet (0.4 mg total) under the tongue every 5 (five) minutes as needed for chest pain. 10/05/13  Yes Brett Canales, PA-C  OVER THE COUNTER MEDICATION Take 1 tablet by mouth daily. "Memory Pill"   Yes Historical Provider, MD  valsartan (DIOVAN) 320 MG tablet Take 320 mg by mouth every morning.   Yes Historical Provider, MD   BP 162/74 mmHg  Pulse 71  Temp(Src) 97.6 F (36.4 C) (Oral)  Resp 20  SpO2 100% Physical Exam  Constitutional: She is oriented to person, place, and time. She appears well-developed and well-nourished. No distress.  HENT:  Head: Normocephalic and atraumatic.  Right Ear: External ear normal.  Left Ear: External ear normal.  Nose: Nose normal.  Eyes: Conjunctivae and EOM are normal. Pupils are equal, round, and reactive to light. Right eye exhibits no discharge. Left eye exhibits no discharge. No scleral icterus.  Neck: Normal range of motion. Neck supple.  Cardiovascular: Normal rate, regular rhythm and normal heart sounds.  Exam reveals no gallop and no friction rub.   No murmur heard. Pulmonary/Chest: Effort normal and breath sounds normal. No stridor. No respiratory distress. She has no wheezes.  Abdominal: Soft. She exhibits no distension. There is no tenderness.  Musculoskeletal: She exhibits no  edema or tenderness.  Neurological: She is alert and oriented to person, place, and time.  3 word recall: 3/3.   Cranial Nerves  II Visual Fields: Intact to confrontation. Visual fields intact. III, IV, VI: Pupils equal and reactive to light and near. Full eye movement without nystagmus  V Facial Sensation: Normal. No weakness of masticatory muscles  VII: No facial weakness or asymmetry  VIII Auditory Acuity: Grossly normal  IX/X: The uvula is midline; the palate elevates symmetrically  XI: Normal sternocleidomastoid and trapezius strength  XII: The tongue is midline. No atrophy or fasciculations.   Motor System: Muscle Strength: 5/5 and symmetric in the upper and lower extremities. No pronation or drift.  Muscle Tone: Tone and muscle bulk are normal in the upper and lower extremities.   Reflexes: DTRs: 2+ and symmetrical in all four extremities. Plantar responses are flexor bilaterally.  Coordination: Intact finger-to-nose, heel-to-shin, and rapid alternating movements. No tremor.  Sensation: decreased sensation to right face and RUE (baseline from prior stroke); otherwise intact. Negative Romberg test.  Gait: Routine and tandem gait are normal    Skin:  Skin is warm and dry. No rash noted. She is not diaphoretic. No erythema.  Psychiatric: She has a normal mood and affect.    ED Course  Procedures (including critical care time) Labs Review Labs Reviewed  COMPREHENSIVE METABOLIC PANEL - Abnormal; Notable for the following:    Glucose, Bld 102 (*)    Creatinine, Ser 1.31 (*)    ALT 12 (*)    GFR calc non Af Amer 38 (*)    GFR calc Af Amer 44 (*)    All other components within normal limits  I-STAT CHEM 8, ED - Abnormal; Notable for the following:    Chloride 100 (*)    BUN 23 (*)    Creatinine, Ser 1.30 (*)    All other components within normal limits  PROTIME-INR  APTT  CBC  DIFFERENTIAL  Randolm Idol, ED    Imaging Review Mr Brain Wo Contrast  10/03/2015   CLINICAL DATA:  Memory loss. History of hypertension, stroke, headache, cancer, hyperlipidemia. EXAM: MRI HEAD WITHOUT CONTRAST TECHNIQUE: Multiplanar, multiecho pulse sequences of the brain and surrounding structures were obtained without intravenous contrast. COMPARISON:  CT head September 28, 2014 and MRI head November 21, 2005 FINDINGS: Subcentimeter focus of reduced diffusion LEFT mesial thalamus, with low ADC value. Old bilateral thalamus lacunar infarcts. Multiple small cerebellar infarcts though, new on the RIGHT from 2007. Ventricles and sulci are normal for patient's age. Patchy supratentorial white matter FLAIR T2 hyperintensities without midline shift, mass effect or mass lesions. No susceptibility artifact to suggest blood products. No abnormal extra-axial fluid collections. Normal major intracranial vascular flow voids present at the skullbase. Trace paranasal sinus mucosal thickening without air-fluid levels. The mastoid air cells are well aerated. Status post bilateral ocular lens implants. No abnormal sellar expansion. No cerebellar tonsillar ectopia. No suspicious calvarial bone marrow signal. IMPRESSION: Acute LEFT thalamus lacunar infarct. Old bilateral thalamus and cerebellar infarcts. Mild chronic small vessel ischemic disease. Electronically Signed   By: Elon Alas M.D.   On: 10/03/2015 00:20   I have personally reviewed and evaluated these images and lab results as part of my medical decision-making.   EKG Interpretation   Date/Time:  Wednesday October 02 2015 15:00:37 EST Ventricular Rate:  67 PR Interval:  170 QRS Duration: 90 QT Interval:  408 QTC Calculation: 431 R Axis:   74 Text Interpretation:  Normal sinus rhythm Biatrial enlargement No  significant change since last tracing Confirmed by Maryan Rued  MD, Loree Fee  [phone]) on 10/02/2015 9:05:51 PM      MDM   81 year old female with a history of stroke presents with acute memory problems which seemed to be  nondominant functional deficits. Rest of the history and exam as above. MRI confirmed acute left thalamic stroke. Neurology consultation. They requested admission to medicine service for continued management. Neurology to follow. Next  Patient remained hemodynamically stable while in the ED and was transported to the floor without complication.  Patient seen in conjunction with Dr. Maryan Rued.  Final diagnoses:  Arterial ischemic stroke, vertebrobasilar, thalamic, acute, left [facility])        Addison Lank, MD 10/03/15 0127  Blanchie Dessert, MD 10/03/15 2337

## 2015-10-02 NOTE — ED Notes (Addendum)
Pt reports to the ED for eval of memory loss. She reports she had a dream during the night of 2/13 where she was having memory problems. LSN on 2230 on 2/13. Pt cannot how to retrieve her messages or numbers from her phone. She has hx of stroke. Denies any pain or HA. No problems walking, talking, unilateral weakness, dizziness, lightheadedness, or vision changes. Pt A&Ox4, resp e/u, and skin warm and dry.

## 2015-10-03 ENCOUNTER — Inpatient Hospital Stay (HOSPITAL_COMMUNITY): Payer: Medicare Other

## 2015-10-03 ENCOUNTER — Observation Stay (HOSPITAL_COMMUNITY): Payer: Medicare Other

## 2015-10-03 DIAGNOSIS — Z888 Allergy status to other drugs, medicaments and biological substances status: Secondary | ICD-10-CM | POA: Diagnosis not present

## 2015-10-03 DIAGNOSIS — Z880 Allergy status to penicillin: Secondary | ICD-10-CM | POA: Diagnosis not present

## 2015-10-03 DIAGNOSIS — I2510 Atherosclerotic heart disease of native coronary artery without angina pectoris: Secondary | ICD-10-CM | POA: Diagnosis present

## 2015-10-03 DIAGNOSIS — N183 Chronic kidney disease, stage 3 unspecified: Secondary | ICD-10-CM | POA: Diagnosis present

## 2015-10-03 DIAGNOSIS — I63212 Cerebral infarction due to unspecified occlusion or stenosis of left vertebral arteries: Secondary | ICD-10-CM

## 2015-10-03 DIAGNOSIS — I639 Cerebral infarction, unspecified: Secondary | ICD-10-CM | POA: Diagnosis present

## 2015-10-03 DIAGNOSIS — E785 Hyperlipidemia, unspecified: Secondary | ICD-10-CM | POA: Diagnosis present

## 2015-10-03 DIAGNOSIS — Z87891 Personal history of nicotine dependence: Secondary | ICD-10-CM | POA: Diagnosis not present

## 2015-10-03 DIAGNOSIS — G459 Transient cerebral ischemic attack, unspecified: Secondary | ICD-10-CM | POA: Diagnosis present

## 2015-10-03 DIAGNOSIS — I6322 Cerebral infarction due to unspecified occlusion or stenosis of basilar arteries: Secondary | ICD-10-CM

## 2015-10-03 DIAGNOSIS — I6381 Other cerebral infarction due to occlusion or stenosis of small artery: Secondary | ICD-10-CM | POA: Diagnosis present

## 2015-10-03 DIAGNOSIS — I252 Old myocardial infarction: Secondary | ICD-10-CM | POA: Diagnosis not present

## 2015-10-03 DIAGNOSIS — I129 Hypertensive chronic kidney disease with stage 1 through stage 4 chronic kidney disease, or unspecified chronic kidney disease: Secondary | ICD-10-CM | POA: Diagnosis present

## 2015-10-03 DIAGNOSIS — I10 Essential (primary) hypertension: Secondary | ICD-10-CM

## 2015-10-03 DIAGNOSIS — Z85828 Personal history of other malignant neoplasm of skin: Secondary | ICD-10-CM | POA: Diagnosis not present

## 2015-10-03 DIAGNOSIS — R200 Anesthesia of skin: Secondary | ICD-10-CM | POA: Diagnosis present

## 2015-10-03 DIAGNOSIS — I69398 Other sequelae of cerebral infarction: Secondary | ICD-10-CM | POA: Diagnosis not present

## 2015-10-03 LAB — LIPID PANEL
Cholesterol: 278 mg/dL — ABNORMAL HIGH (ref 0–200)
HDL: 41 mg/dL (ref >40)
LDL Cholesterol: 183 mg/dL — ABNORMAL HIGH (ref 0–99)
Total CHOL/HDL Ratio: 6.8 RATIO
Triglycerides: 272 mg/dL — ABNORMAL HIGH (ref <150)
VLDL: 54 mg/dL — ABNORMAL HIGH (ref 0–40)

## 2015-10-03 LAB — GLUCOSE, CAPILLARY: Glucose-Capillary: 95 mg/dL (ref 65–99)

## 2015-10-03 LAB — CBG MONITORING, ED: Glucose-Capillary: 153 mg/dL — ABNORMAL HIGH (ref 65–99)

## 2015-10-03 MED ORDER — STROKE: EARLY STAGES OF RECOVERY BOOK
Freq: Once | Status: DC
  Filled 2015-10-03: qty 1

## 2015-10-03 MED ORDER — ASPIRIN 300 MG RE SUPP: 300.0000 mg | Freq: Every day | RECTAL | Status: DC

## 2015-10-03 MED ORDER — ACETAMINOPHEN 650 MG RE SUPP: 650.0000 mg | RECTAL | Status: DC | PRN

## 2015-10-03 MED ORDER — ACETAMINOPHEN 325 MG PO TABS: 650.0000 mg | ORAL_TABLET | ORAL | Status: DC | PRN

## 2015-10-03 MED ORDER — HYDROCHLOROTHIAZIDE 25 MG PO TABS
25.0000 mg | ORAL_TABLET | Freq: Every morning | ORAL | Status: DC
  Administered 2015-10-03: 25 mg via ORAL
  Filled 2015-10-03: qty 1

## 2015-10-03 MED ORDER — METOPROLOL TARTRATE 25 MG PO TABS
25.0000 mg | ORAL_TABLET | Freq: Two times a day (BID) | ORAL | Status: DC
  Administered 2015-10-03 – 2015-10-04 (×3): 25 mg via ORAL
  Filled 2015-10-03 (×3): qty 1

## 2015-10-03 MED ORDER — ASPIRIN EC 325 MG PO TBEC: 325.0000 mg | DELAYED_RELEASE_TABLET | Freq: Every day | ORAL | Status: DC

## 2015-10-03 MED ORDER — PRAVASTATIN SODIUM 40 MG PO TABS
40.0000 mg | ORAL_TABLET | Freq: Every day | ORAL | Status: DC
Start: 2015-10-03 — End: 2015-10-04
  Administered 2015-10-03: 40 mg via ORAL
  Filled 2015-10-03: qty 1

## 2015-10-03 MED ORDER — IRBESARTAN 300 MG PO TABS
300.0000 mg | ORAL_TABLET | Freq: Every day | ORAL | Status: DC
  Administered 2015-10-03: 300 mg via ORAL
  Filled 2015-10-03 (×2): qty 1

## 2015-10-03 MED ORDER — ENOXAPARIN SODIUM 40 MG/0.4ML ~~LOC~~ SOLN
40.0000 mg | Freq: Every day | SUBCUTANEOUS | Status: DC
  Administered 2015-10-03 – 2015-10-04 (×2): 40 mg via SUBCUTANEOUS
  Filled 2015-10-03 (×2): qty 0.4

## 2015-10-03 MED ORDER — ASPIRIN EC 81 MG PO TBEC
81.0000 mg | DELAYED_RELEASE_TABLET | Freq: Every day | ORAL | Status: DC
  Administered 2015-10-04: 81 mg via ORAL
  Filled 2015-10-03: qty 1

## 2015-10-03 MED ORDER — ASPIRIN EC 325 MG PO TBEC
325.0000 mg | DELAYED_RELEASE_TABLET | Freq: Every day | ORAL | Status: DC
  Administered 2015-10-03: 325 mg via ORAL
  Filled 2015-10-03: qty 1

## 2015-10-03 MED ORDER — CLOPIDOGREL BISULFATE 75 MG PO TABS
75.0000 mg | ORAL_TABLET | Freq: Every day | ORAL | Status: DC
  Administered 2015-10-04: 75 mg via ORAL
  Filled 2015-10-03: qty 1

## 2015-10-03 NOTE — Progress Notes (Signed)
Patient seen and evaluated earlier this a.m. by my associate. Please refer to H&P for details regarding assessment and plan.  Neurology on board and currently patient undergoing further evaluation and recommendations  We'll plan on reassessing next a.m.  Patient in no acute distress, alert and awake, no increased work of breathing, no cyanosis,  Will reassess next am  [place], Linward Foster

## 2015-10-03 NOTE — H&P (Signed)
History and Physical  Patient Name: Crystal Day     [id number]    DOB: November 13, 1934    DOA: 10/02/2015 Referring physician: Addison Lank, MD PCP: Reginia Naas, MD      Chief Complaint: Forgetfulness, confusion  HPI: Crystal Day is a 81 y.o. female with a past medical history significant for HTN, CAD, and history of CVA/TIA who presents with new onset altered mental status.  The patient was in her normal state of health until yesterday morning when she suddenly woke up with altered mental status and inability to remember things she normally would've been able to remember. Throughout the day yesterday and today she has had problems remembering things like people's names and phone numbers that she would normally have no trouble remembering. Today she could not remember how to work her phone, which is unusual for her. There is no focal weakness, no slurred speech, no syncope, no seizures, no fever.  In the ED, she was hemodynamically stable and afebrile. K4.0, HCO3 30, creatinine 1.314 baseline of 1.2, WBC 5.5, hemoglobin 13.6. The troponin was normal. An MRI was ordered which showed a new acute lacunar infarct.  The patient reports having had a stroke with right hand weakness 5 or 6 years ago here at [facility]. However, I can find no record of this.    Review of Systems:  Pt complains of memory problems. All other systems negative except as just noted or noted in the history of present illness.  Allergies  Allergen Reactions  . Penicillins Hives  . Tetracyclines & Related Nausea Only  . Elavil [Amitriptyline] Rash  . Erythromycin Rash  . Macrodantin [Nitrofurantoin Macrocrystal] Rash    Prior to Admission medications   Medication Sig Start Date End Date Taking? Authorizing Provider  calcium gluconate 500 MG tablet Take 1 tablet by mouth daily.   Yes Historical Provider, MD  hydrochlorothiazide (HYDRODIURIL) 25 MG tablet Take 25 mg by mouth every morning.   Yes Historical  Provider, MD  metoprolol tartrate (LOPRESSOR) 25 MG tablet Take 1 tablet (25 mg total) by mouth 2 (two) times daily. Patient taking differently: Take 25 mg by mouth daily.  09/30/13  Yes Brett Canales, PA-C  nitroGLYCERIN (NITROSTAT) 0.4 MG SL tablet Place 1 tablet (0.4 mg total) under the tongue every 5 (five) minutes as needed for chest pain. 10/05/13  Yes Brett Canales, PA-C  OVER THE COUNTER MEDICATION Take 1 tablet by mouth daily. "Memory Pill"   Yes Historical Provider, MD  valsartan (DIOVAN) 320 MG tablet Take 320 mg by mouth every morning.   Yes Historical Provider, MD    Past Medical History  Diagnosis Date  . Hypertension   . Stroke [facility]) 2009    residuals include numbness on right side of lips and right finger tips  . Anginal pain ([place]) since 2014  . Pneumonia     "in my 20's"  . Headache(784.0)     "had migraines for 20 years, not anymore"  . Cancer ([place]) 1988 or 89    basal cell carcinoma on face s/p Mohs procedure  . Arthritis     just in the hands  . Non-STEMI (non-ST elevated myocardial infarction) ([place])   . Hyperlipidemia   . Complication of anesthesia     nausea  . History of wheezing     in spring and fall  . PONV (postoperative nausea and vomiting)     Past Surgical History  Procedure Laterality Date  . Breast surgery Left  1976    lumpectomy  . Abdominal hysterectomy  1974  . Fracture surgery Right 1971    thumb and index finger  . Left heart catheterization with coronary angiogram N/A 09/29/2013    Procedure: LEFT HEART CATHETERIZATION WITH CORONARY ANGIOGRAM;  Surgeon: Peter M Martinique, MD;  Location: [facility] CATH LAB;  Service: Cardiovascular;  Laterality: N/A;  . Eye surgery      cataract surgery, both eyes  . Partial knee arthroplasty Right 12/31/2014    Procedure: RIGHT KNEE MEDIAL UNICOMPARTMENTAL ARTHROPLASTY;  Surgeon: Gaynelle Arabian, MD;  Location: [facility] ORS;  Service: Orthopedics;  Laterality: Right;    Family history: family history includes Cancer in  her mother; Heart attack in her maternal grandfather; Heart attack (age of onset: 65) in her father; Stroke in her brother, paternal grandfather, and paternal grandmother.  Social History: Patient lives alone. She is from [place]. Her husband passed 1 2000. She has a daughter in [place]. She still drives and is independent with all IADLs and ADLs. She is able to handle stairs with no problem and does not use cane or walker.  She has minimal smoking history.       Physical Exam: BP 123/60 mmHg  Pulse 67  Temp(Src) 97.6 F (36.4 C) (Oral)  Resp 19  SpO2 99% General appearance: Well-developed, adult female, alert and in no acute distress.   Eyes: Anicteric, conjunctiva pink, lids and lashes normal.     ENT: No nasal deformity, discharge, or epistaxis.  OP moist without lesions.   Lymph: No cervical, supraclavicular or axillary lymphadenopathy. Skin: Warm and dry.  No jaundice.  No suspicious rashes or lesions. Cardiac: RRR, nl S1-S2, no murmurs appreciated.  Capillary refill is brisk.  JVP normal.  No LE edema.  Radial and DP pulses 2+ and symmetric. No carotid bruits. Respiratory: Normal respiratory rate and rhythm.  CTAB without rales or wheezes. Abdomen: Abdomen soft without rigidity.  No TTP. No ascites, distension.   MSK: No deformities or effusions. Neuro: Visual acuity normal.  Pupils are 4 mm and reactive to 3 mm. Extraocular movements are intact, without nystagmus. Cranial nerve 5 is within normal limits. Cranial nerve 7 is symmetrical. Cranial nerve 8 is within normal limits. Cranial nerves 9 and 10 reveal equal palate elevation. Cranial nerve 11 reveals sternocleidomastoid strong. Cranial nerve 12 is midline. I do not note a deficit in motor strength testing in the upper and lower extremities bilaterally with normal motor, tone and bulk. Romberg maneuver is negative for pathology. Finger-to-nose testing is within normal limits. The patient is oriented to time, place and  person. Speech is fluent. Naming is grossly intact to pen, shoe, and shirt as well as her home medicines. Recall, recent and remote, as well as general fund of knowledge seem within normal limits. Attention span and concentration are within normal limits.   Psych: Behavior appropriate.  Affect normal.  No evidence of aural or visual hallucinations or delusions.       Labs on Admission:  The metabolic panel shows normal sodium, potassium, bicarbonate. There is chronic CKD, and serum creatinine is at baseline PTT and INR are normal. Troponin negative. The complete blood count shows no leukocytosis, anemia, thrombocytopenia.   Radiological Exams on Admission: Mr Brain Wo Contrast 10/03/2015 IMPRESSION: Acute LEFT thalamus lacunar infarct. Old bilateral thalamus and cerebellar infarcts. Mild chronic small vessel ischemic disease.      EKG: Independently reviewed. Rate 67, QTC 431. Sinus rhythm. No ST or T-wave changes.  Echocardiogram 2015:  Ejection fraction 60-65%, no valvular disease.    Assessment/Plan 1. Acute lacunar infarct:  This is new.   -Admit to telemetry -Neuro checks, NIHSS per protocol -Restart EC aspirin 325 mg -Lipids, hemoglobin A1c -Restart statin, pravastatin given previous intolerance -Carotid doppler ordered -Echocardiogram ordered -PT/OT/SLP consultation -Consult to Neurology, appreciate recommendations   2. History of CAD:  -Continue BP control, BB, and restart statin and aspirin  3. HTN:  -Continue home HCTZ, ARB  4. CKD stage III:  Stable.       DVT PPx: Lovenox Diet: Heart healthy after swallow screen Consultants: Neurology Code Status: Full Family Communication: The diagnosis and expected plan of care were dsicussed with the family at the bedside.  All questions were answered.  Code status was discussed.  Medical decision making: Patient seen 2:16 AM on 10/03/2015.  What exists of the patient's electronic chart were reviewed and the  case was discussed with Dr. Leonette Monarch.   Disposition Plan:  Will admit for lacunar infarct.  Stroke work up as above and consult to ancillary services.  Expect discharge within 48 hours.    Edwin Dada [facility] Pager [phone]

## 2015-10-03 NOTE — Consult Note (Signed)
Neurology Consultation Reason for Consult: Stroke Referring Physician: Maryan Rued, W  CC: Memory loss  History is obtained from: Patient, family  HPI: Crystal Day is a 81 y.o. female was in her normal state of health until Tuesday morning. Apparently the night prior, she had a dream that she was having difficulty with her memory. On awakening, however she found that she was continuing to have the symptoms that she experienced during the dream. Specifically, she was having difficulty with remembering phone numbers of people that she knew well and should know the numbers of. She is also having difficulty with operating her cellular phone and figuring out which codes to put in to access her phone as well as difficulty with performing the task checking her messages which is something she knows well how to do. This is been a persistent problem and therefore she sought further evaluation in the emergency room today.  She denies weakness, numbness, difficulty with speech or understanding, other symptoms.   LKW: Tuesday morning tpa given?: no, out of window   ROS: A 14 point ROS was performed and is negative except as noted in the HPI.   Past Medical History  Diagnosis Date  . Hypertension   . Stroke [facility]) 2009    residuals include numbness on right side of lips and right finger tips  . Anginal pain ([place]) since 2014  . Pneumonia     "in my 20's"  . Headache(784.0)     "had migraines for 20 years, not anymore"  . Cancer ([place]) 1988 or 89    basal cell carcinoma on face s/p Mohs procedure  . Arthritis     just in the hands  . Non-STEMI (non-ST elevated myocardial infarction) ([place])   . Hyperlipidemia   . Complication of anesthesia     nausea  . History of wheezing     in spring and fall  . PONV (postoperative nausea and vomiting)      Family History  Problem Relation Age of Onset  . Heart attack Father 68  . Cancer Mother   . Stroke Brother   . Stroke Paternal Grandmother   .  Stroke Paternal Grandfather   . Heart attack Maternal Grandfather      Social History:  reports that she quit smoking about 34 years ago. She has never used smokeless tobacco. She reports that she does not drink alcohol or use illicit drugs.   Exam: Current vital signs: BP 123/60 mmHg  Pulse 67  Temp(Src) 97.6 F (36.4 C) (Oral)  Resp 19  SpO2 99% Vital signs in last 24 hours: Temp:  [97.6 F (36.4 C)] 97.6 F (36.4 C) (02/15 1442) Pulse Rate:  [64-74] 67 (02/16 0200) Resp:  [11-20] 19 (02/16 0045) BP: (123-166)/(51-121) 123/60 mmHg (02/16 0200) SpO2:  [97 %-100 %] 99 % (02/16 0200)   Physical Exam  Constitutional: Appears well-developed and well-nourished.  Psych: Affect appropriate to situation Eyes: No scleral injection HENT: No OP obstrucion Head: Normocephalic.  Cardiovascular: Normal rate and regular rhythm.  Respiratory: Effort normal and breath sounds normal to anterior ascultation GI: Soft.  No distension. There is no tenderness.  Skin: WDI  Neuro: Mental Status: Patient is awake, alert, oriented to person, place, month, year, and situation. Patient is able to give a clear and coherent history. No signs of aphasia or neglect Takes a long time to get the year and to tell how many quarters in $2.75. But gets both eventually.  Cranial Nerves: II: Visual Fields  are full. Pupils are equal, round, and reactive to light.   III,IV, VI: EOMI without ptosis or diploplia.  V: Facial sensation is symmetric to temperature VII: Facial movement is symmetric.  VIII: hearing is intact to voice X: Uvula elevates symmetrically XI: Shoulder shrug is symmetric.  XII: tongue is midline without atrophy or fasciculations.  Motor: Tone is normal. Bulk is normal. 5/5 strength was present in all four extremities.  Sensory: Sensation is symmetric to light touch and temperature in the arms and legs. Cerebellar: FNF and HKS are intact bilaterally   I have reviewed labs in epic  and the results pertinent to this consultation are: Mildly elevated creatinine  I have reviewed the images obtained: MRI brain-small anterior thalamic infarct in the left  Impression: 81 year old female with small thalamic infarct, likely small vessel disease. Being admitted for further workup and therapy.  Recommendations: 1. HgbA1c, fasting lipid panel 2. MRA  of the brain without contrast 3. Frequent neuro checks 4. Echocardiogram 5. Carotid dopplers 6. Prophylactic therapy-Antiplatelet med: Aspirin - dose 325mg  PO or 300mg  PR 7. Risk factor modification 8. Telemetry monitoring 9. OT consult, Speech consult 10. please page stroke NP  Or  PA  Or MD  M-F from 8am -4 pm starting 2/16 as this patient will be followed by the stroke team at this point.   You can look them up on [email or web address]  Password [email or web address]    Roland Rack, MD [facility] [phone]  If 7pm- 7am, please page neurology on call as listed in [place].

## 2015-10-03 NOTE — ED Notes (Signed)
MD at bedside. 

## 2015-10-03 NOTE — ED Notes (Signed)
Family at bedside. 

## 2015-10-03 NOTE — Progress Notes (Signed)
Pt arrived to room 5M05 from ED.  Pt ambulated from stretcher to bed without difficulty.  Pt is alert and oriented with some forgetfulness.  Family is at bedside.  Safety measures in place.  MD confirmed Q4 neuro checks since Q2 was done in ED.  Will continue to monitor.   Fredrich Romans, RN

## 2015-10-03 NOTE — Progress Notes (Signed)
STROKE TEAM PROGRESS NOTE   HISTORY OF PRESENT ILLNESS Crystal Day is a 81 y.o. female was in her normal state of health until Tuesday morning 10/01/2015, time unknown. Apparently the night prior, she had a dream that she was having difficulty with her memory. On awakening, however she found that she was continuing to have the symptoms that she experienced during the dream. Specifically, she was having difficulty with remembering phone numbers of people that she knew well and should know the numbers of. She is also having difficulty with operating her cellular phone and figuring out which codes to put in to access her phone as well as difficulty with performing the task checking her messages which is something she knows well how to do. This is been a persistent problem and therefore she sought further evaluation in the emergency room today 10/02/2015. She denies weakness, numbness, difficulty with speech or understanding, other symptoms. Patient was not administered TPA secondary to delay in arrival. She was admitted for further evaluation and treatment.   SUBJECTIVE (INTERVAL HISTORY) Her daughters and granddaughter are at the bedside.  Overall she feels her condition is unchanged. She stated that since she woke up yesterday, she can not remember phone numbers she used frequently in the past, not remember passwords, etc. She denies any apraxia, weakness or numbness.    OBJECTIVE Temp:  [97.6 F (36.4 C)] 97.6 F (36.4 C) (02/15 1442) Pulse Rate:  [62-75] 66 (02/16 0732) Cardiac Rhythm:  [-]  Resp:  [11-20] 17 (02/16 0732) BP: (106-166)/(49-121) 109/49 mmHg (02/16 0732) SpO2:  [95 %-100 %] 95 % (02/16 0732)  CBC:  Recent Labs Lab 10/02/15 1456 10/02/15 1510  WBC 5.5  --   NEUTROABS 3.3  --   HGB 13.6 14.6  HCT 40.8 43.0  MCV 87.9  --   PLT 171  --     Basic Metabolic Panel:  Recent Labs Lab 10/02/15 1456 10/02/15 1510  NA 143 143  K 3.9 3.8  CL 105 100*  CO2 30  --    GLUCOSE 102* 98  BUN 19 23*  CREATININE 1.31* 1.30*  CALCIUM 9.3  --     Lipid Panel:    Component Value Date/Time   CHOL 221* 09/29/2013 0401   TRIG 138 09/29/2013 0401   HDL 42 09/29/2013 0401   CHOLHDL 5.3 09/29/2013 0401   VLDL 28 09/29/2013 0401   LDLCALC 151* 09/29/2013 0401   HgbA1c:  Lab Results  Component Value Date   HGBA1C 5.9* 09/28/2013   Urine Drug Screen: No results found for: LABOPIA, COCAINSCRNUR, LABBENZ, AMPHETMU, THCU, LABBARB   IMAGING I have personally reviewed the radiological images below and agree with the radiology interpretations.  Mr Brain Wo Contrast 10/03/2015  Acute LEFT thalamus lacunar infarct. Old bilateral thalamus and cerebellar infarcts. Mild chronic small vessel ischemic disease.   Mr Virgel Paling 10/03/2015  Severe stenosis right P1 segment. Moderate disease left posterior cerebral artery Moderate stenosis supra clinoid internal carotid artery on the right. No large vessel occlusion.   2D Echocardiogram  - Left ventricle: The cavity size was normal. There was mild focalbasal hypertrophy of the septum. Systolic function was normal.The estimated ejection fraction was in the range of 60% to 65%.Wall motion was normal; there were no regional wall motionabnormalities. Features are consistent with a pseudonormal leftventricular filling pattern, with concomitant abnormal relaxationand increased filling pressure (grade 2 diastolic dysfunction). - Aortic valve: Trileaflet; moderately thickened, moderatelycalcified leaflets. - Mitral valve: Mild focal thickening and  calcification of theanterior leaflet. - Pericardium, extracardiac: There was a right pleural effusion.  CUS - pending   Physical exam  Temp:  [98.1 F (36.7 C)-98.4 F (36.9 C)] 98.1 F (36.7 C) (02/16 2141) Pulse Rate:  [62-83] 78 (02/16 2141) Resp:  [12-26] 18 (02/16 2141) BP: (106-169)/(49-121) 140/62 mmHg (02/16 2141) SpO2:  [94 %-100 %] 98 % (02/16 2141) Weight:  [155  lb 10.3 oz (70.6 kg)] 155 lb 10.3 oz (70.6 kg) (02/16 2147)  General - Well nourished, well developed, in no apparent distress.  Ophthalmologic - Sharp disc margins OU.   Cardiovascular - Regular rate and rhythm.  Mental Status -  Level of arousal and orientation to time, place, and person were intact. Language including expression, naming, repetition, comprehension was assessed and found intact. No apraxia. Attention span and concentration were normal. Recent and remote memory were 3/3 registration and 0/3 delayed recall with cue 1/3. Fund of Knowledge was assessed and was intact.  Cranial Nerves II - XII - II - Visual field intact OU. III, IV, VI - Extraocular movements intact. V - Facial sensation intact bilaterally. VII - Facial movement intact bilaterally. VIII - Hearing & vestibular intact bilaterally. X - Palate elevates symmetrically. XI - Chin turning & shoulder shrug intact bilaterally. XII - Tongue protrusion intact.  Motor Strength - The patient's strength was normal in all extremities and pronator drift was absent.  Bulk was normal and fasciculations were absent.   Motor Tone - Muscle tone was assessed at the neck and appendages and was normal.  Reflexes - The patient's reflexes were 1+ in all extremities and she had no pathological reflexes.  Sensory - Light touch, temperature/pinprick were assessed and were symmetrical.    Coordination - The patient had normal movements in the hands and feet with no ataxia or dysmetria.  Tremor was absent.  Gait and Station - deferred   ASSESSMENT/PLAN [name] is a 81 y.o. female with history of hypertension, coronary artery disease and previous stroke presenting with memory loss. She did not receive IV t-PA due to later Arrival.   Stroke:  left medial anterior thalamic infarct secondary to small vessel disease source  Resultant  Memory deficit  MRI  Left medial anterior thalamic infarct, old b/l thalami and  cerebellar lacunar infarcts  MRA  severe right P1 stenosis  Carotid Doppler  pending  2D Echo EF 60-65%  LDL 151  HgbA1c 5.9  Lovenox 40 mg sq daily for VTE prophylaxis  Diet NPO time specified  No antithrombotic prior to admission, now on aspirin 325 mg daily. Due to intracranial stenosis, recommend ASA 81mg  and plavix 75mg  dual antiplatelet for 3  Months and then plavix alone.   Patient counseled to be compliant with her antithrombotic medications  Ongoing aggressive stroke risk factor management  Therapy recommendations:  pending   Disposition:  pending   Hypertension  Stable  Permissive hypertension (OK if < 220/120) but gradually normalize in 5-7 days  Hyperlipidemia  Home meds:  No statin   LDL 151, goal < 70  New Pravachol 40 mg daily started  Continue statin at discharge  Other Stroke Risk Factors  Advanced age  Former Cigarette smoker, quit smoking 34 years ago   Hx stroke/TIA  10 years ago per patient - right hand weakness  Family hx stroke (rother, paternal grandmother, paternal grandfather)  Coronary artery disease - non-STEMI  History migraines 20 years  Other Active Problems  Chronic kidney disease stage III  Hospital day #  1  Rosalin Hawking, MD PhD Stroke Neurology 10/03/2015 9:53 PM      To contact Stroke Continuity provider, please refer to [email or web address] After hours, contact General Neurology

## 2015-10-03 NOTE — ED Notes (Signed)
Pt in ECHO, family in room.

## 2015-10-03 NOTE — ED Notes (Signed)
Patient transported to Echo.

## 2015-10-03 NOTE — Progress Notes (Signed)
  Echocardiogram 2D Echocardiogram has been performed.  Crystal Day 10/03/2015, 10:06 AM

## 2015-10-04 ENCOUNTER — Inpatient Hospital Stay (HOSPITAL_COMMUNITY): Payer: Medicare Other

## 2015-10-04 DIAGNOSIS — I6322 Cerebral infarction due to unspecified occlusion or stenosis of basilar arteries: Secondary | ICD-10-CM

## 2015-10-04 DIAGNOSIS — E785 Hyperlipidemia, unspecified: Secondary | ICD-10-CM

## 2015-10-04 DIAGNOSIS — G459 Transient cerebral ischemic attack, unspecified: Secondary | ICD-10-CM

## 2015-10-04 LAB — HEMOGLOBIN A1C
Hgb A1c MFr Bld: 6.1 % — ABNORMAL HIGH (ref 4.8–5.6)
Mean Plasma Glucose: 128 mg/dL

## 2015-10-04 LAB — GLUCOSE, CAPILLARY
Glucose-Capillary: 98 mg/dL (ref 65–99)
Glucose-Capillary: 92 mg/dL (ref 65–99)

## 2015-10-04 MED ORDER — VALSARTAN 320 MG PO TABS: 160.0000 mg | ORAL_TABLET | Freq: Every morning | ORAL | Status: DC

## 2015-10-04 MED ORDER — ASPIRIN 81 MG PO TBEC: 81.0000 mg | DELAYED_RELEASE_TABLET | Freq: Every day | ORAL | Status: AC

## 2015-10-04 MED ORDER — PRAVASTATIN SODIUM 40 MG PO TABS: 40.0000 mg | ORAL_TABLET | Freq: Every day | ORAL | Status: DC

## 2015-10-04 MED ORDER — CLOPIDOGREL BISULFATE 75 MG PO TABS: 75.0000 mg | ORAL_TABLET | Freq: Every day | ORAL | Status: DC

## 2015-10-04 NOTE — Evaluation (Signed)
Physical Therapy Evaluation Patient Details Name: Crystal Day MRN: [id number] DOB: Jan 05, 1935 Today's Date: 10/04/2015   History of Present Illness  Adm s/p several day h/o decr memory/confusion (inability to remember names or familiar numbers--phone, code to answering machine). MRI + Lt thalamus infarct with old bil thalami and cerebellar infarcts  PMHx-HTN, HLD, CVA, Rt TKR 12/2014    Clinical Impression  Patient evaluated by Physical Therapy with no further acute PT needs identified. Patient with mild gait instability that she feels is near baseline and due to lack of ambulation since admitted. Educated in role of PT and ability to request PT from MD at her follow-up visit if balance deteriorates. All education has been completed and the patient has no further questions. PT is signing off. Thank you for this referral.     Follow Up Recommendations No PT follow up;Supervision - Intermittent    Equipment Recommendations  None recommended by PT    Recommendations for Other Services Speech consult     Precautions / Restrictions Precautions Precautions: Fall Precaution Comments: reports mildly "off balance" PTA      Mobility  Bed Mobility Overal bed mobility: Modified Independent                Transfers Overall transfer level: Independent                  Ambulation/Gait Ambulation/Gait assistance: Min guard;Supervision Ambulation Distance (Feet): 300 Feet Assistive device: None Gait Pattern/deviations: Step-through pattern;Decreased stride length;Staggering right;Drifts right/left Gait velocity: initially slow; able to incr and balance improved Gait velocity interpretation: at or above normal speed for age/gender General Gait Details: in initial 50 ft had mild stagger step to Rt; with head turns mild drift to Lt when looking Lt (she reports is her baseline and due to lack of ambulation since arriving)  Stairs Stairs: Yes Stairs assistance:  Supervision Stair Management: No rails;Alternating pattern;Step to pattern;Forwards Number of Stairs: 5 General stair comments: pt has been doing step-to since knee surgery (did without imbalance); wanted to try alternating and slight imbalance recovering herself  Wheelchair Mobility    Modified Rankin (Stroke Patients Only) Modified Rankin (Stroke Patients Only) Pre-Morbid Rankin Score: No symptoms Modified Rankin: No significant disability     Balance Overall balance assessment: Needs assistance Sitting-balance support: No upper extremity supported;Feet unsupported Sitting balance-Leahy Scale: Normal Sitting balance - Comments: reaching and fixing her socks   Standing balance support: No upper extremity supported Standing balance-Leahy Scale: Good                   Standardized Balance Assessment Standardized Balance Assessment : Dynamic Gait Index   Dynamic Gait Index Level Surface: Mild Impairment Change in Gait Speed: Normal Gait with Horizontal Head Turns: Mild Impairment Gait with Vertical Head Turns: Normal Gait and Pivot Turn: Normal Step Over Obstacle: Mild Impairment Step Around Obstacles: Normal Steps: Mild Impairment Total Score: 20       Pertinent Vitals/Pain Pain Assessment: No/denies pain    Home Living Family/patient expects to be discharged to:: Private residence Living Arrangements: Alone Available Help at Discharge: Family;Friend(s);Available PRN/intermittently Type of Home: House Home Access: Stairs to enter Entrance Stairs-Rails: None (wall) Entrance Stairs-Number of Steps: 3 Home Layout: One level Home Equipment: Walker - 2 wheels;Cane - single point      Prior Function Level of Independence: Independent               Hand Dominance   Dominant Hand: Right  Extremity/Trunk Assessment   Upper Extremity Assessment: Defer to OT evaluation;Overall WFL for tasks assessed           Lower Extremity Assessment:  Overall WFL for tasks assessed      Cervical / Trunk Assessment: Normal  Communication   Communication: No difficulties  Cognition Arousal/Alertness: Awake/alert Behavior During Therapy: WFL for tasks assessed/performed Overall Cognitive Status: Within Functional Limits for tasks assessed                      General Comments      Exercises        Assessment/Plan    PT Assessment Patent does not need any further PT services  PT Diagnosis Difficulty walking   PT Problem List    PT Treatment Interventions     PT Goals (Current goals can be found in the Care Plan section) Acute Rehab PT Goals Patient Stated Goal: go home PT Goal Formulation: All assessment and education complete, DC therapy    Frequency     Barriers to discharge        Co-evaluation               End of Session Equipment Utilized During Treatment: Gait belt Activity Tolerance: Patient tolerated treatment well Patient left: in chair;with call bell/phone within reach;with family/visitor present           Time: [id number] PT Time Calculation (min) (ACUTE ONLY): 18 min   Charges:   PT Evaluation $PT Eval Low Complexity: 1 Procedure     PT G Codes:        Crystal Day 2015-10-08, 3:39 PM Pager [phone]

## 2015-10-04 NOTE — Care Management Note (Signed)
Case Management Note  Patient Details  Name: Crystal Day MRN: [id number] Date of Birth: September 02, 1934  Subjective/Objective:                    Action/Plan: Patient was admitted with CVA. Lives at home alone. Will follow for discharge needs pending PT/OT evals and physician orders.  Expected Discharge Date:                  Expected Discharge Plan:     In-House Referral:     Discharge planning Services     Post Acute Care Choice:    Choice offered to:     DME Arranged:    DME Agency:     HH Arranged:    HH Agency:     Status of Service:  In process, will continue to follow  Medicare Important Message Given:    Date Medicare IM Given:    Medicare IM give by:    Date Additional Medicare IM Given:    Additional Medicare Important Message give by:     If discussed at [place] of Stay Meetings, dates discussed:    Additional Comments:  Rolm Baptise, RN 10/04/2015, 10:45 AM [phone]

## 2015-10-04 NOTE — Evaluation (Signed)
Speech Language Pathology Evaluation Patient Details Name: Crystal Day MRN: [id number] DOB: [birth year] Today's Date: 10/04/2015 Time: [id number] SLP Time Calculation (min) (ACUTE ONLY): 14 min  Problem List:  Patient Active Problem List   Diagnosis Date Noted  . TIA (transient ischemic attack)   . Lacunar infarct, acute ([place]) 10/03/2015  . CKD (chronic kidney disease) stage 3, GFR 30-59 ml/min 10/03/2015  . Arterial ischemic stroke, vertebrobasilar, thalamic, acute ([place])   . HLD (hyperlipidemia)   . OA (osteoarthritis) of knee 12/31/2014  . Syncope 09/28/2014  . Hyperlipidemia 04/17/2014  . Atherosclerosis of native coronary artery of native heart without angina pectoris 10/05/2013  . NSTEMI (non-ST elevated myocardial infarction) ([place]) 09/29/2013  . Chest pain 09/28/2013  . Essential hypertension 09/28/2013  . Acute renal failure ([place]) 09/28/2013   Past Medical History:  Past Medical History  Diagnosis Date  . Hypertension   . Stroke [facility]) 2009    residuals include numbness on right side of lips and right finger tips  . Anginal pain ([place]) since 2014  . Pneumonia     "in my 20's"  . Headache(784.0)     "had migraines for 20 years, not anymore"  . Cancer ([place]) 1988 or 89    basal cell carcinoma on face s/p Mohs procedure  . Arthritis     just in the hands  . Non-STEMI (non-ST elevated myocardial infarction) ([place])   . Hyperlipidemia   . Complication of anesthesia     nausea  . History of wheezing     in spring and fall  . PONV (postoperative nausea and vomiting)    Past Surgical History:  Past Surgical History  Procedure Laterality Date  . Breast surgery Left 1976    lumpectomy  . Abdominal hysterectomy  1974  . Fracture surgery Right 1971    thumb and index finger  . Left heart catheterization with coronary angiogram N/A 09/29/2013    Procedure: LEFT HEART CATHETERIZATION WITH CORONARY ANGIOGRAM;  Surgeon: Peter M Martinique, MD;  Location: [facility] C[facility] LAB;  Service:  Cardiovascular;  Laterality: N/A;  . Eye surgery      cataract surgery, both eyes  . Partial knee arthroplasty Right 12/31/2014    Procedure: RIGHT KNEE MEDIAL UNICOMPARTMENTAL ARTHROPLASTY;  Surgeon: Gaynelle Arabian, MD;  Location: [facility] ORS;  Service: Orthopedics;  Laterality: Right;   HPI:  Adm s/p several day h/o decr memory/confusion (inability to remember names or familiar numbers--phone, code to answering machine). MRI + Lt thalamus infarct with old bil thalami and cerebellar infarcts PMHx-HTN, HLD, CVA, Rt TKR 12/2014    Assessment / Plan / Recommendation Clinical Impression  Pt's cognitive-linguistic status has returned to baseline: she is fluent, with no language deficits; speech is clear; there are mild deficits in short-term recall, compatible with baseline function.  Attention and problem-solving also at baseline.  No f/u SLP is recommended.  Pt in agreement.     SLP Assessment  Patient does not need any further Speech Lanaguage Pathology Services    Follow Up Recommendations  None          SLP Evaluation Prior Functioning  Cognitive/Linguistic Baseline: Within functional limits Type of Home: House  Lives With: Alone Available Help at Discharge: Family;Friend(s);Available PRN/intermittently   Cognition  Overall Cognitive Status: History of cognitive impairments - at baseline (mild memory impairment) Arousal/Alertness: Awake/alert Orientation Level: Oriented X4 Memory: Impaired Memory Impairment: Retrieval deficit Awareness: Appears intact Problem Solving: Appears intact    Comprehension  Auditory Comprehension Overall Auditory Comprehension:  Appears within functional limits for tasks assessed Visual Recognition/Discrimination Discrimination: Within Function Limits Reading Comprehension Reading Status: Within funtional limits    Expression Expression Primary Mode of Expression: Verbal Verbal Expression Overall Verbal Expression: Appears within functional limits  for tasks assessed Written Expression Dominant Hand: Right Written Expression: Not tested   Oral / Motor  Oral Motor/Sensory Function Overall Oral Motor/Sensory Function: Within functional limits Motor Speech Overall Motor Speech: Appears within functional limits for tasks assessed   GO                    Juan Quam Laurice 10/04/2015, 4:22 PM   Jiselle Sheu L. Tivis Ringer, [place] CCC/SLP Pager [phone]

## 2015-10-04 NOTE — Progress Notes (Signed)
Discharge orders received, Pt for discharge home today.. IV d/c'd. D/c instructions and RX given with verbalized understanding. Family at bedside to assist patient with discharge. Staff bought pt downstairs via wheelchair. 10/04/15 1638

## 2015-10-04 NOTE — Care Management Note (Signed)
Case Management Note  Patient Details  Name: Crystal Day MRN: [id number] Date of Birth: 07-Jun-1935  Subjective/Objective:                    Action/Plan: Plan is for patient to discharge home with self care and husband. No further needs per CM.   Expected Discharge Date:                  Expected Discharge Plan:  Home/Self Care  In-House Referral:     Discharge planning Services     Post Acute Care Choice:    Choice offered to:     DME Arranged:    DME Agency:     HH Arranged:    [place] Agency:     Status of Service:  Completed, signed off  Medicare Important Message Given:    Date Medicare IM Given:    Medicare IM give by:    Date Additional Medicare IM Given:    Additional Medicare Important Message give by:     If discussed at [place] of Stay Meetings, dates discussed:    Additional Comments:  Pollie Friar, RN 10/04/2015, 3:28 PM

## 2015-10-04 NOTE — Progress Notes (Signed)
PT Cancellation Note  Patient Details Name: BRETTA BRUN MRN: [id number] DOB: 1935/07/18   Cancelled Treatment:    Reason Eval/Treat Not Completed: undergoing vascular study. Will return this pm   Curstin Schmale 10/04/2015, 1:56 PM  Pager [phone]

## 2015-10-04 NOTE — Discharge Summary (Signed)
Physician Discharge Summary  Crystal Day [id number] DOB: 11-11-1934 DOA: 10/02/2015  PCP: Reginia Naas, MD  Admit date: 10/02/2015 Discharge date: 10/04/2015  Time spent: 40 minutes  Recommendations for Outpatient Follow-up:  1. Repeat LFTs and lipid panel in 3 months 2. Follow-up basic metabolic panel to reassess electrolytes and renal function during next visit 3. Reassess blood pressure and adjust antihypertensive regimen as needed   Discharge Diagnoses:  Principal Problem:   Lacunar infarct, acute ([place]) Active Problems:   Essential hypertension   Atherosclerosis of native coronary artery of native heart without angina pectoris   CKD (chronic kidney disease) stage 3, GFR 30-59 ml/min   Arterial ischemic stroke, vertebrobasilar, thalamic, acute (HCC)   HLD (hyperlipidemia)   Discharge Condition: Stable and improved. Patient advised to follow with PCP in 14 days and with neurology service in 2 month.  Diet recommendation: Heart healthy diet  Filed Weights   10/03/15 2147  Weight: 70.6 kg (155 lb 10.3 oz)    History of present illness:  81 y.o. female with a past medical history significant for HTN, CAD, and history of CVA/TIA who presents with new onset altered mental status.  The patient was in her normal state of health until yesterday morning when she suddenly woke up with altered mental status and inability to remember things she normally would've been able to remember. Throughout the day yesterday and today she has had problems remembering things like people's names and phone numbers that she would normally have no trouble remembering. Today she could not remember how to work her phone, which is unusual for her. There is no focal weakness, no slurred speech, no syncope, no seizures, no fever.  In the ED, she was hemodynamically stable and afebrile. K4.0, HCO3 30, creatinine 1.314 baseline of 1.2, WBC 5.5, hemoglobin 13.6. The troponin was normal. An MRI was  ordered which showed a new acute lacunar infarct.  Hospital Course:  1-acute left thalamic lacunar infarct: Appears to be secondary to small vessel disease -2-D echo with preserved ejection fraction, no palpable disorder, no wall motion abnormalities and no source of thrombi. -MRI/MRA confirming diagnosis of acute left lacunar infarct -Carotid Dopplers with mild bilateral ICA stenosis, with antegrade vertebral artery flow  -Following neurology recommendations we'll continue aspirin and Plavix for 3 months with subsequent use of just Plavix. -Continue risk factor modification.  2-HLD -Patient has been started on Pravachol -LDL 151 -Will recommend repeat LFTs and lipid profile in 3 months.  3-history of CAD: Stable -No chest pain, no shortness of breath -Will continue the use of beta blocker, statins and now Plavix  4-hypertension -Stable -Encourage to follow a low-sodium diet -Will continue the use of HCTZ and Diovan (last one dose adjusted to half dose to prevent hypotension) -Reassess BP As an outpatient  5-chronic kidney disease stage III -Stable and at baseline -Will follow renal function trend Procedures:  See below for x-ray reports  MRI: Left medial anterior talamic infarct, old b/l thalami and cerebellar lacunar infarcts  MRA: severe right P1 stenosis  Carotid Doppler: Bilateral: 1-39% ICA stenosis. Vertebral artery flow is antegrade.   2D Echo EF 60-65%, no source of thrombi and no wall motion abnormalities  Consultations:  Neurology service  Discharge Exam: Filed Vitals:   10/04/15 0601 10/04/15 0938  BP: 127/58 140/58  Pulse: 61 61  Temp: 97.6 F (36.4 C) 97.7 F (36.5 C)  Resp: 18 18    General: Afebrile, no dysarthria, denies any chest pain or shortness of  breath. No focal neurologic deficit appreciated Cardiovascular: S1 and S2, no gallops, no rubs; no JVD Respiratory: Clear to auscultation bilaterally, good air movement, no use of  accessory muscles Abdomen: Soft, nontender, nondistended, positive bowel sounds Extremities: No edema, no cyanosis  Discharge Instructions   Discharge Instructions    Ambulatory referral to Neurology    Complete by:  As directed   Dr. Erlinda Hong requests followup in 2 months     Diet - low sodium heart healthy    Complete by:  As directed      Discharge instructions    Complete by:  As directed   Follow a heart healthy diet  Take medications as prescribed Arrange follow-up with PCP in 14 days Please make sure that you follow with neurology service in 2 month as instructed Aspirin and Plavix to be taking together on daily basis for 3 months; then only Plavix Keep herself well hydrated          Current Discharge Medication List    START taking these medications   Details  aspirin EC 81 MG EC tablet Take 1 tablet (81 mg total) by mouth daily. Qty: 30 tablet, Refills: 3    clopidogrel (PLAVIX) 75 MG tablet Take 1 tablet (75 mg total) by mouth daily. Qty: 30 tablet, Refills: 4    pravastatin (PRAVACHOL) 40 MG tablet Take 1 tablet (40 mg total) by mouth daily at 6 PM. Qty: 30 tablet, Refills: 2      CONTINUE these medications which have CHANGED   Details  valsartan (DIOVAN) 320 MG tablet Take 0.5 tablets (160 mg total) by mouth every morning.      CONTINUE these medications which have NOT CHANGED   Details  calcium gluconate 500 MG tablet Take 1 tablet by mouth daily.    hydrochlorothiazide (HYDRODIURIL) 25 MG tablet Take 25 mg by mouth every morning.    metoprolol tartrate (LOPRESSOR) 25 MG tablet Take 1 tablet (25 mg total) by mouth 2 (two) times daily. Qty: 60 tablet, Refills: 5    nitroGLYCERIN (NITROSTAT) 0.4 MG SL tablet Place 1 tablet (0.4 mg total) under the tongue every 5 (five) minutes as needed for chest pain. Qty: 25 tablet, Refills: 3    OVER THE COUNTER MEDICATION Take 1 tablet by mouth daily. "Memory Pill"       Allergies  Allergen Reactions  .  Penicillins Hives  . Tetracyclines & Related Nausea Only  . Elavil [Amitriptyline] Rash  . Erythromycin Rash  . Macrodantin [Nitrofurantoin Macrocrystal] Rash   Follow-up Information    Follow up with Xu,Jindong, MD.   Specialty:  Neurology   Why:  Stroke Clinic, Office will call you with appointment date & time   Contact information:   [address] [place] [address] [phone]       Follow up with Reginia Naas, MD. Schedule an appointment as soon as possible for a visit in 14 days.   Specialty:  Family Medicine   Contact information:   [place]  [phone]       The results of significant diagnostics from this hospitalization (including imaging, microbiology, ancillary and laboratory) are listed below for reference.    Significant Diagnostic Studies: Mr Brain Wo Contrast  10/03/2015  CLINICAL DATA:  Memory loss. History of hypertension, stroke, headache, cancer, hyperlipidemia. EXAM: MRI HEAD WITHOUT CONTRAST TECHNIQUE: Multiplanar, multiecho pulse sequences of the brain and surrounding structures were obtained without intravenous contrast. COMPARISON:  CT  head September 28, 2014 and MRI head November 21, 2005 FINDINGS: Subcentimeter focus of reduced diffusion LEFT mesial thalamus, with low ADC value. Old bilateral thalamus lacunar infarcts. Multiple small cerebellar infarcts though, new on the RIGHT from 2007. Ventricles and sulci are normal for patient's age. Patchy supratentorial white matter FLAIR T2 hyperintensities without midline shift, mass effect or mass lesions. No susceptibility artifact to suggest blood products. No abnormal extra-axial fluid collections. Normal major intracranial vascular flow voids present at the skullbase. Trace paranasal sinus mucosal thickening without air-fluid levels. The mastoid air cells are well aerated. Status post bilateral ocular lens implants. No abnormal sellar expansion. No  cerebellar tonsillar ectopia. No suspicious calvarial bone marrow signal. IMPRESSION: Acute LEFT thalamus lacunar infarct. Old bilateral thalamus and cerebellar infarcts. Mild chronic small vessel ischemic disease. Electronically Signed   By: Elon Alas M.D.   On: 10/03/2015 00:20   Mr Lovenia Kim  10/03/2015  CLINICAL DATA:  Acute infarct left thalamus EXAM: MRA HEAD WITHOUT CONTRAST TECHNIQUE: Angiographic images of the Circle of Willis were obtained using MRA technique without intravenous contrast. COMPARISON:  MRI head 10/02/2015 FINDINGS: Both vertebral arteries patent to the basilar without significant stenosis. Left PICA patent. Right PICA not well seen. Large right AICA. Basilar widely patent. Superior cerebellar arteries patent bilaterally. Fetal origin left posterior cerebral artery with moderate disease in the mid and distal left PCA. Severe stenosis at the origin of the right posterior cerebral artery P1 segment. Cavernous carotid is widely patent bilaterally with mild irregularity. Moderate stenosis supra clinoid internal carotid artery on the right. Anterior and middle cerebral arteries widely patent bilaterally without significant stenosis. Negative for cerebral aneurysm. IMPRESSION: Severe stenosis right P1 segment. Moderate disease left posterior cerebral artery Moderate stenosis supra clinoid internal carotid artery on the right. No large vessel occlusion. Electronically Signed   By: Franchot Gallo M.D.   On: 10/03/2015 11:59   Labs: Basic Metabolic Panel:  Recent Labs Lab 10/02/15 1456 10/02/15 1510  NA 143 143  K 3.9 3.8  CL 105 100*  CO2 30  --   GLUCOSE 102* 98  BUN 19 23*  CREATININE 1.31* 1.30*  CALCIUM 9.3  --    Liver Function Tests:  Recent Labs Lab 10/02/15 1456  AST 24  ALT 12*  ALKPHOS 41  BILITOT 0.8  PROT 6.5  ALBUMIN 3.6   CBC:  Recent Labs Lab 10/02/15 1456 10/02/15 1510  WBC 5.5  --   NEUTROABS 3.3  --   HGB 13.6 14.6  HCT 40.8 43.0   MCV 87.9  --   PLT 171  --    CBG:  Recent Labs Lab 10/03/15 1008 10/03/15 2159 10/04/15 0604 10/04/15 1139  GLUCAP 153* 95 98 92    Signed:  Barton Dubois MD.  [facility] 10/04/2015, 3:19 PM

## 2015-10-04 NOTE — Evaluation (Signed)
Occupational Therapy Evaluation Patient Details Name: Crystal Day MRN: [id number] DOB: 08-16-1935 Today's Date: 10/04/2015    History of Present Illness Adm s/p several day h/o decr memory/confusion (inability to remember names or familiar numbers--phone, code to answering machine). MRI + Lt thalamus infarct with old bil thalami and cerebellar infarcts  PMHx-HTN, HLD, CVA, Rt TKR 12/2014   Clinical Impression   Pt reports she was independent with ADLs PTA. Currently pt is overall mod I with ADLs and functional mobility with the exception of supervision for safety with tub transfers and bathing while standing. Discussed daughter to supervise with bathing until pt feels like she is at baseline with balance. Pt planning to d/c home with intermittent supervision from family/friends. Pt ready to d/c from OT standpoint; signing off at this time. Thank you for this referral.    Follow Up Recommendations  No OT follow up;Supervision - Intermittent    Equipment Recommendations  Tub/shower seat    Recommendations for Other Services       Precautions / Restrictions Precautions Precautions: Fall Precaution Comments: reports mildly "off balance" PTA Restrictions Weight Bearing Restrictions: No      Mobility Bed Mobility Overal bed mobility: Modified Independent             General bed mobility comments: Pt sitting EOB upon arrival.  Transfers Overall transfer level: Independent Equipment used: None                  Balance Overall balance assessment: Needs assistance Sitting-balance support: No upper extremity supported;Feet supported Sitting balance-Leahy Scale: Normal Sitting balance - Comments: reaching and fixing her socks   Standing balance support: No upper extremity supported Standing balance-Leahy Scale: Good Standing balance comment: dynamic                 Standardized Balance Assessment Standardized Balance Assessment : Dynamic Gait Index    Dynamic Gait Index Level Surface: Mild Impairment Change in Gait Speed: Normal Gait with Horizontal Head Turns: Mild Impairment Gait with Vertical Head Turns: Normal Gait and Pivot Turn: Normal Step Over Obstacle: Mild Impairment Step Around Obstacles: Normal Steps: Mild Impairment Total Score: 20      ADL Overall ADL's : Needs assistance/impaired Eating/Feeding: Modified independent;Sitting   Grooming: Modified independent;Standing   Upper Body Bathing: Supervision/ safety;Standing   Lower Body Bathing: Supervison/ safety (standing)   Upper Body Dressing : Modified independent;Sitting   Lower Body Dressing: Modified independent;Sit to/from stand Lower Body Dressing Details (indicate cue type and reason): Verbal cues for sitting for donning pants. Toilet Transfer: Modified Independent;Ambulation;Regular Toilet   Toileting- Clothing Manipulation and Hygiene: Modified independent;Sit to/from stand   Tub/ Shower Transfer: Supervision/safety;Ambulation Tub/Shower Transfer Details (indicate cue type and reason): Simulated tub transfer in room; pt off balance without UE support. Pt reports she can hold on to the wall when she is transferring in and out of the tub. Suggested that daughter be home with her and supervise tub transfers until she feels like balance is back to baseline. Functional mobility during ADLs: Modified independent General ADL Comments: Pt reports she feels close to baseline level. Pt inquiring if she will receive home health services; discussed follow up with MD if she feels balance is not getting better and they can refer to outpatient therapy if necessary.     Vision     Perception     Praxis      Pertinent Vitals/Pain Pain Assessment: No/denies pain     Hand Dominance Right  Extremity/Trunk Assessment Upper Extremity Assessment Upper Extremity Assessment: Overall WFL for tasks assessed   Lower Extremity Assessment Lower Extremity Assessment:  Defer to PT evaluation   Cervical / Trunk Assessment Cervical / Trunk Assessment: Normal   Communication Communication Communication: No difficulties   Cognition Arousal/Alertness: Awake/alert Behavior During Therapy: WFL for tasks assessed/performed Overall Cognitive Status: Within Functional Limits for tasks assessed                     General Comments       Exercises       Shoulder Instructions      Home Living Family/patient expects to be discharged to:: Private residence Living Arrangements: Alone Available Help at Discharge: Family;Friend(s);Available PRN/intermittently Type of Home: House Home Access: Stairs to enter [employer] of Steps: 3 Entrance Stairs-Rails: None Home Layout: One level     Bathroom Shower/Tub: Tub/shower unit;Curtain Shower/tub characteristics: Architectural technologist: Handicapped height Bathroom Accessibility: No   Home Equipment: Environmental consultant - 2 wheels;Kasandra Knudsen - single point      Lives With: Alone    Prior Functioning/Environment Level of Independence: Independent             OT Diagnosis: Generalized weakness   OT Problem List:     OT Treatment/Interventions:      OT Goals(Current goals can be found in the care plan section) Acute Rehab OT Goals Patient Stated Goal: go home OT Goal Formulation: All assessment and education complete, DC therapy  OT Frequency:     Barriers to D/C:            Co-evaluation              End of Session Nurse Communication: Other (comment) (pt ready to d/c from OT standpoint.)  Activity Tolerance: Patient tolerated treatment well Patient left: in bed;with call bell/phone within reach;with family/visitor present (sitting EOB)   [id number] OT Time Calculation (min): 11 min Charges:  OT General Charges $OT Visit: 1 Procedure OT Evaluation $OT Eval Low Complexity: 1 Procedure G-Codes:     Binnie Kand M.S., OTR/L Pager: [phone]  10/04/2015, 4:40  PM

## 2015-10-04 NOTE — Progress Notes (Signed)
STROKE TEAM PROGRESS NOTE   SUBJECTIVE (INTERVAL HISTORY) Her daughter, family members are at the bedside. She is awake, talkative. Her brother and his wife arrived during rounds.    OBJECTIVE Temp:  [97.6 F (36.4 C)-98.4 F (36.9 C)] 97.7 F (36.5 C) (02/17 0938) Pulse Rate:  [61-83] 61 (02/17 0938) Cardiac Rhythm:  [-] Normal sinus rhythm (02/17 0900) Resp:  [14-26] 18 (02/17 0938) BP: (114-169)/(42-92) 140/58 mmHg (02/17 0938) SpO2:  [94 %-99 %] 96 % (02/17 0938) Weight:  [70.6 kg (155 lb 10.3 oz)] 70.6 kg (155 lb 10.3 oz) (02/16 2147)  CBC:   Recent Labs Lab 10/02/15 1456 10/02/15 1510  WBC 5.5  --   NEUTROABS 3.3  --   HGB 13.6 14.6  HCT 40.8 43.0  MCV 87.9  --   PLT 171  --     Basic Metabolic Panel:   Recent Labs Lab 10/02/15 1456 10/02/15 1510  NA 143 143  K 3.9 3.8  CL 105 100*  CO2 30  --   GLUCOSE 102* 98  BUN 19 23*  CREATININE 1.31* 1.30*  CALCIUM 9.3  --     Lipid Panel:     Component Value Date/Time   CHOL 278* 10/03/2015 1658   TRIG 272* 10/03/2015 1658   HDL 41 10/03/2015 1658   CHOLHDL 6.8 10/03/2015 1658   VLDL 54* 10/03/2015 1658   LDLCALC 183* 10/03/2015 1658   HgbA1c:  Lab Results  Component Value Date   HGBA1C 6.1* 10/02/2015   Urine Drug Screen: No results found for: LABOPIA, COCAINSCRNUR, LABBENZ, AMPHETMU, THCU, LABBARB   IMAGING I have personally reviewed the radiological images below and agree with the radiology interpretations.  Mr Brain Wo Contrast 10/03/2015  Acute LEFT thalamus lacunar infarct. Old bilateral thalamus and cerebellar infarcts. Mild chronic small vessel ischemic disease.   Mr Virgel Paling 10/03/2015  Severe stenosis right P1 segment. Moderate disease left posterior cerebral artery Moderate stenosis supra clinoid internal carotid artery on the right. No large vessel occlusion.   2D Echocardiogram  - Left ventricle: The cavity size was normal. There was mild focalbasal hypertrophy of the septum.  Systolic function was normal.The estimated ejection fraction was in the range of 60% to 65%.Wall motion was normal; there were no regional wall motionabnormalities. Features are consistent with a pseudonormal leftventricular filling pattern, with concomitant abnormal relaxationand increased filling pressure (grade 2 diastolic dysfunction). - Aortic valve: Trileaflet; moderately thickened, moderatelycalcified leaflets. - Mitral valve: Mild focal thickening and calcification of theanterior leaflet. - Pericardium, extracardiac: There was a right pleural effusion.  Carotid Ultrasound - Bilateral: 1-39% ICA stenosis. Vertebral artery flow is antegrade.   Physical exam  Temp:  [97.6 F (36.4 C)-98.4 F (36.9 C)] 97.7 F (36.5 C) (02/17 0938) Pulse Rate:  [61-83] 61 (02/17 0938) Resp:  [14-26] 18 (02/17 0938) BP: (114-169)/(42-92) 140/58 mmHg (02/17 0938) SpO2:  [94 %-99 %] 96 % (02/17 0938) Weight:  [70.6 kg (155 lb 10.3 oz)] 70.6 kg (155 lb 10.3 oz) (02/16 2147)  General - Well nourished, well developed, in no apparent distress.  Ophthalmologic - Sharp disc margins OU.   Cardiovascular - Regular rate and rhythm.  Mental Status -  Level of arousal and orientation to time, place, and person were intact. Language including expression, naming, repetition, comprehension was assessed and found intact. No apraxia. Attention span and concentration were normal. Recent and remote memory were 3/3 registration and 0/3 delayed recall with cue 1/3. Fund of Knowledge was assessed and was  intact.  Cranial Nerves II - XII - II - Visual field intact OU. III, IV, VI - Extraocular movements intact. V - Facial sensation intact bilaterally. VII - Facial movement intact bilaterally. VIII - Hearing & vestibular intact bilaterally. X - Palate elevates symmetrically. XI - Chin turning & shoulder shrug intact bilaterally. XII - Tongue protrusion intact.  Motor Strength - The patient's strength was  normal in all extremities and pronator drift was absent.  Bulk was normal and fasciculations were absent.   Motor Tone - Muscle tone was assessed at the neck and appendages and was normal.  Reflexes - The patient's reflexes were 1+ in all extremities and she had no pathological reflexes.  Sensory - Light touch, temperature/pinprick were assessed and were symmetrical.    Coordination - The patient had normal movements in the hands and feet with no ataxia or dysmetria.  Tremor was absent.  Gait and Station - deferred to PT due to safety concerns   ASSESSMENT/PLAN Crystal Day is a 81 y.o. female with history of hypertension, coronary artery disease and previous stroke presenting with memory loss. She did not receive IV t-PA due to later Arrival.   Stroke:  left medial anterior thalamic infarct secondary to small vessel disease source  Resultant  Memory deficit  MRI  Left medial anterior thalamic infarct, old b/l thalami and cerebellar lacunar infarcts  MRA  severe right P1 stenosis  Carotid Doppler  unremarkable  2D Echo EF 60-65%  LDL 151  HgbA1c 5.9  Lovenox 40 mg sq daily for VTE prophylaxis Diet Heart Room service appropriate?: Yes; Fluid consistency:: Thin  No antithrombotic prior to admission, now on aspirin 325 mg daily. Due to intracranial stenosis, recommend ASA 81mg  and plavix 75mg  dual antiplatelet for 3  Months and then plavix alone.   Patient counseled to be compliant with her antithrombotic medications  Ongoing aggressive stroke risk factor management  Therapy recommendations:  pending   Disposition:  Return home  [facility] for discharge once carotid completed and therapy evals done  Follow up with Dr. Erlinda Hong in 2 months, order written    Hypertension  Stable  Permissive hypertension (OK if < 220/120) but gradually normalize in 5-7 days  Hyperlipidemia  Home meds:  No statin   LDL 151, goal < 70  New Pravachol 40 mg daily started  Continue statin at  discharge  PCP to follow up tolerance, ongoing cholesterol lowering  Other Stroke Risk Factors  Advanced age  Former Cigarette smoker, quit smoking 34 years ago   Hx stroke/TIA  10 years ago per patient - right hand weakness  Family hx stroke (rother, paternal grandmother, paternal grandfather)  Coronary artery disease - non-STEMI  History migraines 20 years  Other Active Problems  Chronic kidney disease stage III  Hospital day # 1 1  Neurology will sign off. Please call with questions. Pt will follow up with Dr. Erlinda Hong at [facility] in about 2 months. Thanks for the consult.  Rosalin Hawking, MD PhD Stroke Neurology 10/04/2015 8:47 PM     To contact Stroke Continuity provider, please refer to [email or web address] After hours, contact General Neurology

## 2015-10-04 NOTE — Progress Notes (Signed)
VASCULAR LAB PRELIMINARY  PRELIMINARY  PRELIMINARY  PRELIMINARY  Carotid duplex  completed.    Preliminary report:  Bilateral:  1-39% ICA stenosis.  Vertebral artery flow is antegrade.      Karon Heckendorn, RVT 10/04/2015, 2:07 PM

## 2015-11-21 ENCOUNTER — Ambulatory Visit (INDEPENDENT_AMBULATORY_CARE_PROVIDER_SITE_OTHER): Payer: Medicare Other | Admitting: Neurology

## 2015-11-21 ENCOUNTER — Encounter: Payer: Self-pay | Admitting: Neurology

## 2015-11-21 VITALS — BP 132/71 | HR 64 | Ht 69.0 in | Wt 160.4 lb

## 2015-11-21 DIAGNOSIS — I639 Cerebral infarction, unspecified: Secondary | ICD-10-CM | POA: Diagnosis not present

## 2015-11-21 DIAGNOSIS — I10 Essential (primary) hypertension: Secondary | ICD-10-CM

## 2015-11-21 DIAGNOSIS — I6381 Other cerebral infarction due to occlusion or stenosis of small artery: Secondary | ICD-10-CM

## 2015-11-21 DIAGNOSIS — E785 Hyperlipidemia, unspecified: Secondary | ICD-10-CM

## 2015-11-21 NOTE — Patient Instructions (Signed)
-   continue ASA and plavix for one more month, and then plavix alone. - try low dose pravastatin, may consider to add pravicid or pepcide to help for the acid reflux - Follow up with your primary care physician for stroke risk factor modification. Recommend maintain blood pressure goal <130/80, diabetes with hemoglobin A1c goal below 6.5% and lipids with LDL cholesterol goal below 70 mg/dL.  - check BP at home - healthy diet and regular exercise - follow up in 4 months.

## 2015-11-22 NOTE — Progress Notes (Signed)
STROKE NEUROLOGY FOLLOW UP NOTE  NAME: Crystal Day DOB: August 25, 1934  REASON FOR VISIT: stroke follow up HISTORY FROM: pt and chart  Today we had the pleasure of seeing Crystal Day in follow-up at our Neurology Clinic. Pt was accompanied by no one.   History Summary [name] is a 81 y.o. female with history of hypertension, coronary artery disease and previous stroke admitted on 10/02/15 for memory loss. MRI showed left medial anterior thalamic infarct, but also remote b/l thalami and cerebellar lacunar infarcts. MRA showed severe right P1 stenosis. CUS, TTE unremarkable. LDL 183 and A1C 5.9. She was discharged with dual antiplatelet and pravastatin 40.  Interval History During the interval time, the patient has been doing well. No recurrent stroke like symptoms. She felt her memory much improved. She can not tolerate pravastatin 40 and stopped by herself. BP today 132/71, and at home 125/70. She will see her PCP next week.     REVIEW OF SYSTEMS: Full 14 system review of systems performed and notable only for those listed below and in HPI above, all others are negative:  Constitutional:   Cardiovascular:  Ear/Nose/Throat:   Skin:  Eyes:   Respiratory:  wheezing Gastroitestinal:   Genitourinary:  Hematology/Lymphatic:   Endocrine:  Musculoskeletal:   Allergy/Immunology:   Neurological:  numbness Psychiatric:  Sleep: restless leg  The following represents the patient's updated allergies and side effects list: Allergies  Allergen Reactions  . Penicillins Hives  . Tetracyclines & Related Nausea Only  . Elavil [Amitriptyline] Rash  . Erythromycin Rash  . Macrodantin [Nitrofurantoin Macrocrystal] Rash    The neurologically relevant items on the patient's problem list were reviewed on today's visit.  Neurologic Examination  A problem focused neurological exam (12 or more points of the single system neurologic examination, vital signs counts as 1 point, cranial  nerves count for 8 points) was performed.  Blood pressure 132/71, pulse 64, height 5\' 9"  (1.753 m), weight 160 lb 6.4 oz (72.757 kg).  General - Well nourished, well developed, in no apparent distress.  Ophthalmologic - Sharp disc margins OU. Fundi not visualized due to .  Cardiovascular - Regular rate and rhythm with no murmur.  Mental Status -  Level of arousal and orientation to time, place, and person were intact. Language including expression, naming, repetition, comprehension was assessed and found intact. Attention span and concentration were normal. Recent and remote memory were 3/3 registration and 2/3 delayed recall and 3/3 with choices. Fund of Knowledge was assessed and was intact.  Cranial Nerves II - XII - II - Visual field intact OU. III, IV, VI - Extraocular movements intact. V - Facial sensation intact bilaterally. VII - Facial movement intact bilaterally. VIII - Hearing & vestibular intact bilaterally. X - Palate elevates symmetrically. XI - Chin turning & shoulder shrug intact bilaterally. XII - Tongue protrusion intact.  Motor Strength - The patient's strength was normal in all extremities and pronator drift was absent.  Bulk was normal and fasciculations were absent.   Motor Tone - Muscle tone was assessed at the neck and appendages and was normal.  Reflexes - The patient's reflexes were 1+ in all extremities and she had no pathological reflexes.  Sensory - Light touch, temperature/pinprick were assessed and were normal.    Coordination - The patient had normal movements in the hands and feet with no ataxia or dysmetria.  Tremor was absent.  Gait and Station - The patient's transfers, posture, gait, station, and  turns were observed as normal.  Data reviewed: I personally reviewed the images and agree with the radiology interpretations.  Mr Brain Wo Contrast 10/03/2015 Acute LEFT thalamus lacunar infarct. Old bilateral thalamus and cerebellar infarcts.  Mild chronic small vessel ischemic disease.   Mr Virgel Paling 10/03/2015 Severe stenosis right P1 segment. Moderate disease left posterior cerebral artery Moderate stenosis supra clinoid internal carotid artery on the right. No large vessel occlusion.   2D Echocardiogram  - Left ventricle: The cavity size was normal. There was mild focalbasal hypertrophy of the septum. Systolic function was normal.The estimated ejection fraction was in the range of 60% to 65%.Wall motion was normal; there were no regional wall motionabnormalities. Features are consistent with a pseudonormal leftventricular filling pattern, with concomitant abnormal relaxationand increased filling pressure (grade 2 diastolic dysfunction). - Aortic valve: Trileaflet; moderately thickened, moderatelycalcified leaflets. - Mitral valve: Mild focal thickening and calcification of theanterior leaflet. - Pericardium, extracardiac: There was a right pleural effusion.  Carotid Ultrasound - Bilateral: 1-39% ICA stenosis. Vertebral artery flow is antegrade.  Component     Latest Ref Rng 10/02/2015 10/03/2015  Cholesterol     0 - 200 mg/dL  278 (H)  Triglycerides     <150 mg/dL  272 (H)  HDL Cholesterol     >40 mg/dL  41  Total CHOL/HDL Ratio       6.8  VLDL     0 - 40 mg/dL  54 (H)  LDL (calc)     0 - 99 mg/dL  183 (H)  Hemoglobin A1C     4.8 - 5.6 % 6.1 (H)   Mean Plasma Glucose      128     Assessment: As you may recall, she is a 81 y.o. Caucasian female with PMH of HTN, CAD and previous stroke admitted on 10/02/15 for left medial anterior thalamic infarct. MRI also found remote b/l thalami and cerebellar lacunar infarcts. MRA showed severe right P1 stenosis. CUS, TTE unremarkable. LDL 183 and A1C 5.9. She was discharged with dual antiplatelet and pravastatin 40. During the interval time, the patient has been doing well. Can not tolerate pravastatin 40 due to acid reflux and stopped by herself. Due to high LDL, recommend to  continue with trial of lower dose pravastatin.  Plan:  - continue ASA and plavix for total 3 months, and then plavix alone. - try lower dose pravastatin - Follow up with your primary care physician for stroke risk factor modification. Recommend maintain blood pressure goal <130/80, diabetes with hemoglobin A1c goal below 6.5% and lipids with LDL cholesterol goal below 70 mg/dL.  - check BP at home - healthy diet and regular exercise - follow up in 4 months.   I spent more than 25 minutes of face to face time with the patient. Greater than 50% of time was spent in counseling and coordination of care. We discussed about importance of statin in treatment of HLD, stroke prevention and follow up with PCP.   No orders of the defined types were placed in this encounter.    No orders of the defined types were placed in this encounter.    Patient Instructions  - continue ASA and plavix for one more month, and then plavix alone. - try low dose pravastatin, may consider to add pravicid or pepcide to help for the acid reflux - Follow up with your primary care physician for stroke risk factor modification. Recommend maintain blood pressure goal <130/80, diabetes with hemoglobin A1c goal below  6.5% and lipids with LDL cholesterol goal below 70 mg/dL.  - check BP at home - healthy diet and regular exercise - follow up in 4 months.    Rosalin Hawking, MD PhD [facility] [address] [place], [place] [address] [phone]

## 2015-12-04 ENCOUNTER — Ambulatory Visit: Payer: Medicare Other | Admitting: Neurology

## 2016-02-10 ENCOUNTER — Ambulatory Visit
Admission: RE | Admit: 2016-02-10 | Discharge: 2016-02-10 | Disposition: A | Discharge status: Home / Self Care | Payer: Medicare Other | Source: Ambulatory Visit | Attending: Family Medicine | Admitting: Family Medicine

## 2016-02-10 ENCOUNTER — Other Ambulatory Visit: Payer: Self-pay | Admitting: Family Medicine

## 2016-02-10 DIAGNOSIS — R059 Cough, unspecified: Secondary | ICD-10-CM

## 2016-02-10 DIAGNOSIS — R05 Cough: Secondary | ICD-10-CM

## 2016-02-24 ENCOUNTER — Ambulatory Visit (INDEPENDENT_AMBULATORY_CARE_PROVIDER_SITE_OTHER): Payer: Medicare Other | Admitting: Neurology

## 2016-02-24 ENCOUNTER — Encounter: Payer: Self-pay | Admitting: Neurology

## 2016-02-24 VITALS — BP 137/77 | HR 77 | Ht 69.0 in | Wt 154.8 lb

## 2016-02-24 DIAGNOSIS — I639 Cerebral infarction, unspecified: Secondary | ICD-10-CM

## 2016-02-24 DIAGNOSIS — I6381 Other cerebral infarction due to occlusion or stenosis of small artery: Secondary | ICD-10-CM

## 2016-02-24 DIAGNOSIS — E785 Hyperlipidemia, unspecified: Secondary | ICD-10-CM | POA: Diagnosis not present

## 2016-02-24 DIAGNOSIS — I10 Essential (primary) hypertension: Secondary | ICD-10-CM | POA: Diagnosis not present

## 2016-02-24 NOTE — Patient Instructions (Signed)
-   continue plavix and pravastatin for stroke prevention. - Follow up with your primary care physician for stroke risk factor modification. Recommend maintain blood pressure goal <140/80, diabetes with hemoglobin A1c goal below 6.5% and lipids with LDL cholesterol goal below 70 mg/dL.  - check BP at home and record and bring over to PCP for medication adjustment if needed - healthy diet and regular exercise - follow up in 6 months.

## 2016-02-24 NOTE — Progress Notes (Signed)
STROKE NEUROLOGY FOLLOW UP NOTE  NAME: Aisling C Darius DOB: [birth year]  REASON FOR VISIT: stroke follow up HISTORY FROM: pt and chart  Today we had the pleasure of seeing Petrina C Rosenbloom in follow-up at our Neurology Clinic. Pt was accompanied by no one.   History Summary Ms. Lina C Rubel is a 81 y.o. female with history of hypertension, coronary artery disease and previous stroke admitted on 10/02/15 for memory loss. MRI showed left medial anterior thalamic infarct, but also remote b/l thalami and cerebellar lacunar infarcts. MRA showed severe right P1 stenosis. CUS, TTE unremarkable. LDL 183 and A1C 5.9. She was discharged with dual antiplatelet and pravastatin 40.  Follow up 11/21/15 - the patient has been doing well. No recurrent stroke like symptoms. She felt her memory much improved. She can not tolerate pravastatin 40 and stopped by herself. BP today 132/71, and at home 125/70. She will see her PCP next week.     Interval History During the interval time, pt has been doing well. No stroke like symptoms. Her BP today 137/77, but at home BP 130-150. She still on 3 BP meds now. Her recent cholesterol still high, and PCP put her back on pravastatin 40mg . She takes 20mg  in am and 20mg  in pm due to acid reflux side effect. She has stopped ASA and only on plavix now. She has wheezing twice a year and is referred by PCP to see pulmonary. Otherwise, no complains.   REVIEW OF SYSTEMS: Full 14 system review of systems performed and notable only for those listed below and in HPI above, all others are negative:  Constitutional:   Cardiovascular:  Ear/Nose/Throat:   Skin:  Eyes:   Respiratory:  wheezing Gastroitestinal:   Genitourinary:  Hematology/Lymphatic:   Endocrine:  Musculoskeletal:   Allergy/Immunology:   Neurological:  numbness Psychiatric:  Sleep:   The following represents the patient's updated allergies and side effects list: Allergies  Allergen Reactions  . Penicillins  Hives  . Tetracyclines & Related Nausea Only  . Elavil [Amitriptyline] Rash  . Erythromycin Rash  . Macrodantin [Nitrofurantoin Macrocrystal] Rash    The neurologically relevant items on the patient's problem list were reviewed on today's visit.  Neurologic Examination  A problem focused neurological exam (12 or more points of the single system neurologic examination, vital signs counts as 1 point, cranial nerves count for 8 points) was performed.  Blood pressure 137/77, pulse 77, height 5\' 9"  (1.753 m), weight 154 lb 12.8 oz (70.217 kg).  General - Well nourished, well developed, in no apparent distress.  Ophthalmologic - Sharp disc margins OU. Fundi not visualized due to .  Cardiovascular - Regular rate and rhythm with no murmur.  Mental Status -  Level of arousal and orientation to time, place, and person were intact. Language including expression, naming, repetition, comprehension was assessed and found intact. Fund of Knowledge was assessed and was intact.  Cranial Nerves II - XII - II - Visual field intact OU. III, IV, VI - Extraocular movements intact. V - Facial sensation intact bilaterally. VII - Facial movement intact bilaterally. VIII - Hearing & vestibular intact bilaterally. X - Palate elevates symmetrically. XI - Chin turning & shoulder shrug intact bilaterally. XII - Tongue protrusion intact.  Motor Strength - The patient's strength was normal in all extremities and pronator drift was absent.  Bulk was normal and fasciculations were absent.   Motor Tone - Muscle tone was assessed at the neck and appendages and was normal.  Reflexes - The patient's reflexes were 1+ in all extremities and she had no pathological reflexes.  Sensory - Light touch, temperature/pinprick were assessed and were normal.    Coordination - The patient had normal movements in the hands and feet with no ataxia or dysmetria.  Tremor was absent.  Gait and Station - The patient's  transfers, posture, gait, station, and turns were observed as normal.  Data reviewed: I personally reviewed the images and agree with the radiology interpretations.  Mr Brain Wo Contrast 10/03/2015 Acute LEFT thalamus lacunar infarct. Old bilateral thalamus and cerebellar infarcts. Mild chronic small vessel ischemic disease.   Mr Virgel Paling 10/03/2015 Severe stenosis right P1 segment. Moderate disease left posterior cerebral artery Moderate stenosis supra clinoid internal carotid artery on the right. No large vessel occlusion.   2D Echocardiogram  - Left ventricle: The cavity size was normal. There was mild focalbasal hypertrophy of the septum. Systolic function was normal.The estimated ejection fraction was in the range of 60% to 65%.Wall motion was normal; there were no regional wall motionabnormalities. Features are consistent with a pseudonormal leftventricular filling pattern, with concomitant abnormal relaxationand increased filling pressure (grade 2 diastolic dysfunction). - Aortic valve: Trileaflet; moderately thickened, moderatelycalcified leaflets. - Mitral valve: Mild focal thickening and calcification of theanterior leaflet. - Pericardium, extracardiac: There was a right pleural effusion.  Carotid Ultrasound - Bilateral: 1-39% ICA stenosis. Vertebral artery flow is antegrade.  Component     Latest Ref Rng 10/02/2015 10/03/2015  Cholesterol     0 - 200 mg/dL  278 (H)  Triglycerides     <150 mg/dL  272 (H)  HDL Cholesterol     >40 mg/dL  41  Total CHOL/HDL Ratio       6.8  VLDL     0 - 40 mg/dL  54 (H)  LDL (calc)     0 - 99 mg/dL  183 (H)  Hemoglobin A1C     4.8 - 5.6 % 6.1 (H)   Mean Plasma Glucose      128     Assessment: As you may recall, she is a 81 y.o. Caucasian female with PMH of HTN, CAD and previous stroke admitted on 10/02/15 for left medial anterior thalamic infarct. MRI also found remote b/l thalami and cerebellar lacunar infarcts. MRA showed severe  right P1 stenosis. CUS, TTE unremarkable. LDL 183 and A1C 5.9. She was discharged with dual antiplatelet and pravastatin 40. During the interval time, the patient has been doing well. Can not tolerate pravastatin 40 due to acid reflux but still high LDL, currently on 20/20 at home. Completed DAPT, currently on plavix.  Plan:  - continue plavix and pravastatin for stroke prevention. - Follow up with your primary care physician for stroke risk factor modification. Recommend maintain blood pressure goal <140/80, diabetes with hemoglobin A1c goal below 6.5% and lipids with LDL cholesterol goal below 70 mg/dL.  - check BP at home and record and bring over to PCP for medication adjustment if needed - healthy diet and regular exercise - follow up in 6 months.   I spent more than 25 minutes of face to face time with the patient. Greater than 50% of time was spent in counseling and coordination of care. We discussed about importance of statin in treatment of HLD, stroke prevention and follow up with PCP.   No orders of the defined types were placed in this encounter.    Meds ordered this encounter  Medications  . rosuvastatin (CRESTOR) 20  MG tablet    Sig: Take 20 mg by mouth daily.    Refill:  3  . valsartan (DIOVAN) 160 MG tablet    Sig: TAKE ONE TABLET BY MOUTH EVERY DAY FOR 90 DAYS    Refill:  1    There are no Patient Instructions on file for this visit.  Rosalin Hawking, MD PhD [facility] [address], [place], [place] [address] [phone]

## 2016-03-11 ENCOUNTER — Encounter: Payer: Self-pay | Admitting: Nurse Practitioner

## 2016-03-11 ENCOUNTER — Ambulatory Visit (INDEPENDENT_AMBULATORY_CARE_PROVIDER_SITE_OTHER): Payer: Medicare Other | Admitting: Nurse Practitioner

## 2016-03-11 VITALS — BP 134/72 | HR 56 | Ht 69.0 in | Wt 156.2 lb

## 2016-03-11 DIAGNOSIS — I10 Essential (primary) hypertension: Secondary | ICD-10-CM

## 2016-03-11 DIAGNOSIS — I2510 Atherosclerotic heart disease of native coronary artery without angina pectoris: Secondary | ICD-10-CM | POA: Diagnosis not present

## 2016-03-11 DIAGNOSIS — E785 Hyperlipidemia, unspecified: Secondary | ICD-10-CM | POA: Diagnosis not present

## 2016-03-11 DIAGNOSIS — I214 Non-ST elevation (NSTEMI) myocardial infarction: Secondary | ICD-10-CM | POA: Insufficient documentation

## 2016-03-11 MED ORDER — NITROGLYCERIN 0.4 MG SL SUBL: 0.4000 mg | SUBLINGUAL_TABLET | SUBLINGUAL | 3 refills | Status: DC | PRN

## 2016-03-11 NOTE — Patient Instructions (Signed)
Medication Instructions:   NO CHANGE  Follow-Up:  Your physician wants you to follow-up in: [place] will receive a reminder letter in the mail two months in advance. If you don't receive a letter, please call our office to schedule the follow-up appointment.   If you need a refill on your cardiac medications before your next appointment, please call your pharmacy.

## 2016-03-11 NOTE — Progress Notes (Signed)
Office Visit    Patient Name: Crystal Day Date of Encounter: 03/11/2016  Primary Care Provider:  Reginia Naas, MD Primary Cardiologist:  Adora Fridge, MD   Chief Complaint    81 year old female with prior history of non-STEMI and nonobstructive CAD status post stroke in February 2017, who presents for follow-up.  Past Medical History    Past Medical History:  Diagnosis Date  . Anginal pain ([place]) since 2014  . Arthritis    just in the hands  . Cancer ([place]) 1988 or 89   basal cell carcinoma on face s/p Mohs procedure  . Complication of anesthesia    nausea  . History of wheezing    in spring and fall  . Hyperlipidemia   . Hypertension   . Non-STEMI (non-ST elevated myocardial infarction) ([place])   . Pneumonia    "in my 20's"  . PONV (postoperative nausea and vomiting)   . Stroke [facility]) 2009   residuals include numbness on right side of lips and right finger tips   Past Surgical History:  Procedure Laterality Date  . ABDOMINAL HYSTERECTOMY  1974  . BREAST SURGERY Left 1976   lumpectomy  . EYE SURGERY     cataract surgery, both eyes  . FRACTURE SURGERY Right 1971   thumb and index finger  . LEFT HEART CATHETERIZATION WITH CORONARY ANGIOGRAM N/A 09/29/2013   Procedure: LEFT HEART CATHETERIZATION WITH CORONARY ANGIOGRAM;  Surgeon: Peter M Martinique, MD;  Location: [facility] CATH LAB;  Service: Cardiovascular;  Laterality: N/A;  . PARTIAL KNEE ARTHROPLASTY Right 12/31/2014   Procedure: RIGHT KNEE MEDIAL UNICOMPARTMENTAL ARTHROPLASTY;  Surgeon: Gaynelle Arabian, MD;  Location: [facility] ORS;  Service: Orthopedics;  Laterality: Right;    Allergies  Allergies  Allergen Reactions  . Penicillins Hives  . Tetracyclines & Related Nausea Only  . Elavil [Amitriptyline] Rash  . Erythromycin Rash  . Macrodantin [Nitrofurantoin Macrocrystal] Rash    History of Present Illness    81 year old female with the above complex past medical history including non-STEMI in February 2016 with  catheterization revealing nonobstructive CAD. She has been medically managed since. She also has a history of hypertension, hyperlipidemia and in February of this year, suffered a left thalamic lacunar stroke. Echo during hospitalization showed normal LV function. Carotid ultrasound showed nonobstructive bilateral internal carotid arterial disease. She has recovered quite well. She is very active. She still mows and weed eats her own yard (riding mower). She is able to do this without any significant limitations in her activity. She denies exertional chest pain but has a several year history of waking up 2-3 times per week with fleeting chest pain, which lasts a few seconds and resolve spontaneously. This has not changed in frequency, severity, or duration. She denies dyspnea, PND, orthopnea, dizziness, syncope, edema, palpitations, or early satiety.  Home Medications    Prior to Admission medications   Medication Sig Start Date End Date Taking? Authorizing Provider  calcium gluconate 500 MG tablet Take 1 tablet by mouth daily.   Yes Historical Provider, MD  clopidogrel (PLAVIX) 75 MG tablet Take 1 tablet (75 mg total) by mouth daily. 10/04/15  Yes Barton Dubois, MD  hydrochlorothiazide (HYDRODIURIL) 25 MG tablet Take 25 mg by mouth every morning.   Yes Historical Provider, MD  metoprolol succinate (TOPROL-XL) 25 MG 24 hr tablet Take 25 mg by mouth daily.   Yes Historical Provider, MD  nitroGLYCERIN (NITROSTAT) 0.4 MG SL tablet Place 1 tablet (0.4 mg total) under the tongue every  5 (five) minutes as needed for chest pain. 03/11/16  Yes Rogelia Mire, NP  OVER THE COUNTER MEDICATION Take 1 tablet by mouth daily. "Memory Pill"   Yes Historical Provider, MD  rosuvastatin (CRESTOR) 20 MG tablet Take 10 mg by mouth 2 (two) times daily.  12/16/15  Yes Historical Provider, MD  valsartan (DIOVAN) 160 MG tablet Take 1 tablet by mouth daily. 01/10/16  Yes Historical Provider, MD    Review of Systems    As  above, she has been doing well since her stroke in February. She has intermittent nocturnal, fleeting chest discomfort occurring 2-3 times per week. This dates back several years.  All other systems reviewed and are otherwise negative except as noted above.  Physical Exam    VS:  BP 134/72 (BP Location: Left Arm)   Pulse (!) 56   Ht 5\' 9"  (1.753 m)   Wt 156 lb 3.2 oz (70.9 kg)   BMI 23.07 kg/m  , BMI Body mass index is 23.07 kg/m. GEN: Well nourished, well developed, in no acute distress.  HEENT: normal.  Neck: Supple, no JVD, carotid bruits, or masses. Cardiac: RRR, no murmurs, rubs, or gallops. No clubbing, cyanosis, edema.  Radials/DP/PT 2+ and equal bilaterally.  Respiratory:  Respirations regular and unlabored, clear to auscultation bilaterally. GI: Soft, nontender, nondistended, BS + x 4. MS: no deformity or atrophy. Skin: warm and dry, no rash. Neuro:  Strength and sensation are intact. Psych: Normal affect.  Accessory Clinical Findings    ECG - Sinus bradycardia, 56, no acute ST or T changes.  Assessment & Plan    1.  Nonobstructive coronary artery disease/history of non-STEMI: Patient suffered a non-STEMI in February 2015 underwent catheterization at that time revealing nonobstructive disease. Ever since then, she has been having intermittent, fleeting, nocturnal, sharp chest pain that awakes her to 3 times per week, usually during a bad dream, and resolve spontaneously. She is very active during the day and has never had symptoms of chest pain during the day. Overall, she feels as though the symptoms have been stable. In that setting, I would not pursue any additional testing at this time. She remains on beta blocker, ARB, and statin therapy. She is also on Plavix given recent stroke in February.  2. Essential hypertension: Blood pressure stable on beta blocker, ARB, and HCTZ.  3. Hyperlipidemia: She was switched from Pravachol to rosuvastatin in the setting of stroke and  poorly controlled hyperlipidemia in February. She says her primary care has since followed up lipids and LFTs. I do not have those records available today.  4. Left thalamic lacunar CVA: She suffered a stroke in February. She is recovered quite well. She has been followed up by neurology. She remains on statin and Plavix therapy.  5. Disposition: Patient will follow up with Dr. Gwenlyn Found in 1 year or sooner if necessary.  Murray Hodgkins, NP 03/11/2016, 11:05 AM

## 2016-08-17 DIAGNOSIS — J029 Acute pharyngitis, unspecified: Secondary | ICD-10-CM | POA: Diagnosis not present

## 2016-08-26 ENCOUNTER — Ambulatory Visit: Payer: Medicare Other | Admitting: Neurology

## 2016-11-27 DIAGNOSIS — N762 Acute vulvitis: Secondary | ICD-10-CM | POA: Diagnosis not present

## 2016-11-27 DIAGNOSIS — R87620 Atypical squamous cells of undetermined significance on cytologic smear of vagina (ASC-US): Secondary | ICD-10-CM | POA: Diagnosis not present

## 2016-11-27 DIAGNOSIS — Z1231 Encounter for screening mammogram for malignant neoplasm of breast: Secondary | ICD-10-CM | POA: Diagnosis not present

## 2016-11-27 DIAGNOSIS — Z124 Encounter for screening for malignant neoplasm of cervix: Secondary | ICD-10-CM | POA: Diagnosis not present

## 2016-11-27 DIAGNOSIS — Z01419 Encounter for gynecological examination (general) (routine) without abnormal findings: Secondary | ICD-10-CM | POA: Diagnosis not present

## 2016-12-02 ENCOUNTER — Encounter: Payer: Self-pay | Admitting: Neurology

## 2016-12-02 ENCOUNTER — Encounter (INDEPENDENT_AMBULATORY_CARE_PROVIDER_SITE_OTHER): Payer: Self-pay

## 2016-12-02 ENCOUNTER — Ambulatory Visit (INDEPENDENT_AMBULATORY_CARE_PROVIDER_SITE_OTHER): Payer: PPO | Admitting: Neurology

## 2016-12-02 VITALS — BP 132/77 | HR 67 | Ht 69.0 in | Wt 159.2 lb

## 2016-12-02 DIAGNOSIS — I6322 Cerebral infarction due to unspecified occlusion or stenosis of basilar arteries: Secondary | ICD-10-CM | POA: Diagnosis not present

## 2016-12-02 DIAGNOSIS — E785 Hyperlipidemia, unspecified: Secondary | ICD-10-CM

## 2016-12-02 DIAGNOSIS — I63212 Cerebral infarction due to unspecified occlusion or stenosis of left vertebral arteries: Secondary | ICD-10-CM | POA: Diagnosis not present

## 2016-12-02 DIAGNOSIS — I10 Essential (primary) hypertension: Secondary | ICD-10-CM

## 2016-12-02 DIAGNOSIS — I6381 Other cerebral infarction due to occlusion or stenosis of small artery: Secondary | ICD-10-CM

## 2016-12-02 NOTE — Patient Instructions (Addendum)
-   continue plavix and lovastatin for stroke prevention. - Follow up with your primary care physician for stroke risk factor modification. Recommend maintain blood pressure goal <140/80, diabetes with hemoglobin A1c goal below 6.5% and lipids with LDL cholesterol goal below 70 mg/dL.  - check BP at home  - healthy diet and regular exercise - follow up as needed.

## 2016-12-02 NOTE — Progress Notes (Signed)
STROKE NEUROLOGY FOLLOW UP NOTE  NAME: Crystal Day DOB: 1934-11-01  REASON FOR VISIT: stroke follow up HISTORY FROM: pt and chart  Today we had the pleasure of seeing Crystal Day in follow-up at our Neurology Clinic. Pt was accompanied by no one.   History Summary Crystal Day is a 82 y.o. female with history of hypertension, coronary artery disease and previous stroke admitted on 10/02/15 for memory loss. MRI showed left medial anterior thalamic infarct, but also remote b/l thalami and cerebellar lacunar infarcts. MRA showed severe right P1 stenosis. CUS, TTE unremarkable. LDL 183 and A1C 5.9. She was discharged with dual antiplatelet and pravastatin 40.  Follow up 11/21/15 - the patient has been doing well. No recurrent stroke like symptoms. She felt her memory much improved. She can not tolerate pravastatin 40 and stopped by herself. BP today 132/71, and at home 125/70. She will see her PCP next week.   Follow up 02/24/16 - pt has been doing well. No stroke like symptoms. Her BP today 137/77, but at home BP 130-150. She still on 3 BP meds now. Her recent cholesterol still high, and PCP put her back on pravastatin 40mg . She takes 20mg  in am and 20mg  in pm due to acid reflux side effect. She has stopped ASA and only on plavix now. She has wheezing twice a year and is referred by PCP to see pulmonary. Otherwise, no complains.  Interval History During the interval time, pt has been doing well. Had flu last winter and otherwise no health issue. On 3 BP meds, and BP stable at home and today 132/77. On plavix and lovastatin. still very active at home with home garden and farm. No complains.    REVIEW OF SYSTEMS: Full 14 system review of systems performed and notable only for those listed below and in HPI above, all others are negative:  Constitutional:   Cardiovascular:  Ear/Nose/Throat:   Skin:  Eyes:   Respiratory:  wheezing Gastroitestinal:   Genitourinary:    Hematology/Lymphatic:   Endocrine:  Musculoskeletal:  Muscle cramps Allergy/Immunology:   Neurological:  Memory loss Psychiatric:  Sleep:   The following represents the patient's updated allergies and side effects list: Allergies  Allergen Reactions  . Penicillins Hives  . Tetracyclines & Related Nausea Only  . Elavil [Amitriptyline] Rash  . Erythromycin Rash  . Macrodantin [Nitrofurantoin Macrocrystal] Rash    The neurologically relevant items on the patient's problem list were reviewed on today's visit.  Neurologic Examination  A problem focused neurological exam (12 or more points of the single system neurologic examination, vital signs counts as 1 point, cranial nerves count for 8 points) was performed.  Blood pressure 132/77, pulse 67, height 5\' 9"  (1.753 m), weight 159 lb 3.2 oz (72.2 kg).  General - Well nourished, well developed, in no apparent distress.  Ophthalmologic - Sharp disc margins OU. Fundi not visualized due to .  Cardiovascular - Regular rate and rhythm with no murmur.  Mental Status -  Level of arousal and orientation to time, place, and person were intact. Language including expression, naming, repetition, comprehension was assessed and found intact. Fund of Knowledge was assessed and was intact.  Cranial Nerves II - XII - II - Visual field intact OU. III, IV, VI - Extraocular movements intact. V - Facial sensation intact bilaterally. VII - Facial movement intact bilaterally. VIII - Hearing & vestibular intact bilaterally. X - Palate elevates symmetrically. XI - Chin turning & shoulder shrug intact  bilaterally. XII - Tongue protrusion intact.  Motor Strength - The patient's strength was normal in all extremities and pronator drift was absent.  Bulk was normal and fasciculations were absent.   Motor Tone - Muscle tone was assessed at the neck and appendages and was normal.  Reflexes - The patient's reflexes were 1+ in all extremities and she  had no pathological reflexes.  Sensory - Light touch, temperature/pinprick were assessed and were normal.    Coordination - The patient had normal movements in the hands and feet with no ataxia or dysmetria.  Tremor was absent.  Gait and Station - The patient's transfers, posture, gait, station, and turns were observed as normal.  Data reviewed: I personally reviewed the images and agree with the radiology interpretations.  Mr Brain Wo Contrast 10/03/2015 Acute LEFT thalamus lacunar infarct. Old bilateral thalamus and cerebellar infarcts. Mild chronic small vessel ischemic disease.   Mr Virgel Paling 10/03/2015 Severe stenosis right P1 segment. Moderate disease left posterior cerebral artery Moderate stenosis supra clinoid internal carotid artery on the right. No large vessel occlusion.   2D Echocardiogram  - Left ventricle: The cavity size was normal. There was mild focalbasal hypertrophy of the septum. Systolic function was normal.The estimated ejection fraction was in the range of 60% to 65%.Wall motion was normal; there were no regional wall motionabnormalities. Features are consistent with a pseudonormal leftventricular filling pattern, with concomitant abnormal relaxationand increased filling pressure (grade 2 diastolic dysfunction). - Aortic valve: Trileaflet; moderately thickened, moderatelycalcified leaflets. - Mitral valve: Mild focal thickening and calcification of theanterior leaflet. - Pericardium, extracardiac: There was a right pleural effusion.  Carotid Ultrasound - Bilateral: 1-39% ICA stenosis. Vertebral artery flow is antegrade.  Component     Latest Ref Rng 10/02/2015 10/03/2015  Cholesterol     0 - 200 mg/dL  278 (H)  Triglycerides     <150 mg/dL  272 (H)  HDL Cholesterol     >40 mg/dL  41  Total CHOL/HDL Ratio       6.8  VLDL     0 - 40 mg/dL  54 (H)  LDL (calc)     0 - 99 mg/dL  183 (H)  Hemoglobin A1C     4.8 - 5.6 % 6.1 (H)   Mean Plasma Glucose       128     Assessment: As you may recall, she is a 82 y.o. Caucasian female with PMH of HTN, CAD and previous stroke admitted on 10/02/15 for left medial anterior thalamic infarct. MRI also found remote b/l thalami and cerebellar lacunar infarcts. MRA showed severe right P1 stenosis. CUS, TTE unremarkable. LDL 183 and A1C 5.9. She was discharged with dual antiplatelet and pravastatin 40. During the interval time, the patient has been doing well. Can not tolerate pravastatin 40 due to acid reflux, currently on lovastatin at home. Completed DAPT, currently on plavix.  Plan:  - continue plavix and lovastatin for stroke prevention. - Follow up with your primary care physician for stroke risk factor modification. Recommend maintain blood pressure goal <140/80, diabetes with hemoglobin A1c goal below 6.5% and lipids with LDL cholesterol goal below 70 mg/dL.  - check BP at home  - healthy diet and regular exercise - follow up as needed.    No orders of the defined types were placed in this encounter.   Meds ordered this encounter  Medications  . nystatin cream (MYCOSTATIN)    Sig: APPLY TO THE AFFECTED AREA(S) BY TOPICAL ROUTE 2  TIMES PER DAY    Refill:  1  . triamcinolone ointment (KENALOG) 0.1 %    Sig: APPLY A THIN LAYER TO THE AFFECTED AREA(S) BY TOPICAL ROUTE 2 TIMES PER DAY    Refill:  2  . lovastatin (MEVACOR) 20 MG tablet    Sig: Take 25 mg by mouth at bedtime.    Patient Instructions  - continue plavix and lovastatin for stroke prevention. - Follow up with your primary care physician for stroke risk factor modification. Recommend maintain blood pressure goal <140/80, diabetes with hemoglobin A1c goal below 6.5% and lipids with LDL cholesterol goal below 70 mg/dL.  - check BP at home  - healthy diet and regular exercise - follow up as needed.    Rosalin Hawking, MD PhD [facility] [address] [place], [place] [address] [phone]

## 2016-12-18 ENCOUNTER — Encounter: Payer: Self-pay | Admitting: Cardiovascular Disease

## 2016-12-18 ENCOUNTER — Ambulatory Visit (INDEPENDENT_AMBULATORY_CARE_PROVIDER_SITE_OTHER): Payer: PPO | Admitting: Cardiovascular Disease

## 2016-12-18 DIAGNOSIS — I10 Essential (primary) hypertension: Secondary | ICD-10-CM

## 2016-12-18 DIAGNOSIS — I214 Non-ST elevation (NSTEMI) myocardial infarction: Secondary | ICD-10-CM

## 2016-12-18 DIAGNOSIS — E7800 Pure hypercholesterolemia, unspecified: Secondary | ICD-10-CM

## 2016-12-18 NOTE — Patient Instructions (Signed)

## 2016-12-18 NOTE — Assessment & Plan Note (Signed)
History of hyperlipidemia on lovastatin followed by her PCP 

## 2016-12-18 NOTE — Assessment & Plan Note (Signed)
History of non-STEMI cardiac catheterization performed by Dr. Martinique 09/29/13 revealing nonobstructive disease. She denies chest pain or shortness of breath.

## 2016-12-18 NOTE — Progress Notes (Signed)
12/18/2016 Crystal Day   1935-05-22  [id number]  Primary Physician Crystal Naas, MD Primary Cardiologist: Crystal Harp MD Crystal Day  HPI:  Crystal Day is a 82 year old mildly overweight widowed Caucasian female mother of 2 children, grandmother for grandchildren, patient of Crystal Day. She has a history of hypertension and hyperlipidemia. She was admitted to [facility] the hospital in February 2015 for chest pain and non-STEMI. She ultimately underwent cardiac catheterization by Crystal Day 09/29/13 revealing nonobstructive CAD and medical therapy was recommended. She has had no recurrent cardiac symptoms. She did have a stroke however last year which she is recovering from.   Current Outpatient Prescriptions  Medication Sig Dispense Refill  . calcium gluconate 500 MG tablet Take 1 tablet by mouth daily.    . clopidogrel (PLAVIX) 75 MG tablet Take 1 tablet (75 mg total) by mouth daily. 30 tablet 4  . hydrochlorothiazide (HYDRODIURIL) 25 MG tablet Take 25 mg by mouth every morning.    . lovastatin (MEVACOR) 20 MG tablet Take 25 mg by mouth at bedtime.    . metoprolol succinate (TOPROL-XL) 25 MG 24 hr tablet Take 25 mg by mouth daily.    . nitroGLYCERIN (NITROSTAT) 0.4 MG SL tablet Place 1 tablet (0.4 mg total) under the tongue every 5 (five) minutes as needed for chest pain. 25 tablet 3  . nystatin cream (MYCOSTATIN) APPLY TO THE AFFECTED AREA(S) BY TOPICAL ROUTE 2 TIMES PER DAY  1  . triamcinolone ointment (KENALOG) 0.1 % APPLY A THIN LAYER TO THE AFFECTED AREA(S) BY TOPICAL ROUTE 2 TIMES PER DAY  2  . valsartan (DIOVAN) 160 MG tablet Take 1 tablet by mouth daily.  1   No current facility-administered medications for this visit.     Allergies  Allergen Reactions  . Penicillins Hives  . Tetracyclines & Related Nausea Only  . Elavil [Amitriptyline] Rash  . Erythromycin Rash  . Macrodantin [Nitrofurantoin Macrocrystal] Rash    Social History    Social History  . Marital status: Widowed    Spouse name: N/A  . Number of children: N/A  . Years of education: N/A   Occupational History  . Not on file.   Social History Main Topics  . Smoking status: Former Smoker    Packs/day: 0.25    Years: 5.00    Quit date: 08/17/1981  . Smokeless tobacco: Never Used  . Alcohol use No  . Drug use: No  . Sexual activity: Not on file   Other Topics Concern  . Not on file   Social History Narrative  . No narrative on file     Review of Systems: General: negative for chills, fever, night sweats or weight changes.  Cardiovascular: negative for chest pain, dyspnea on exertion, edema, orthopnea, palpitations, paroxysmal nocturnal dyspnea or shortness of breath Dermatological: negative for rash Respiratory: negative for cough or wheezing Urologic: negative for hematuria Abdominal: negative for nausea, vomiting, diarrhea, bright red blood per rectum, melena, or hematemesis Neurologic: negative for visual changes, syncope, or dizziness All other systems reviewed and are otherwise negative except as noted above.    Blood pressure (!) 188/84, pulse 64, height 5\' 9"  (1.753 m), weight 158 lb 6.4 oz (71.8 kg).  General appearance: alert and no distress Neck: no adenopathy, no carotid bruit, no JVD, supple, symmetrical, trachea midline and thyroid not enlarged, symmetric, no tenderness/mass/nodules Lungs: clear to auscultation bilaterally Heart: regular rate and rhythm, S1, S2 normal, no murmur, click, rub  or gallop Extremities: extremities normal, atraumatic, no cyanosis or edema  EKG sinus rhythm at 64 without ST or T-wave changes. I personally reviewed this EKG  ASSESSMENT AND PLAN:   Essential hypertension History of essential hypertension with blood pressure measured today at 188/84. She is on hydrochlorothiazide and metoprolol. She says that she has "white coat syndrome". Blood pressure measured this morning at home was 132/70.  Continue current meds are current dosing  NSTEMI (non-ST elevated myocardial infarction) History of non-STEMI cardiac catheterization performed by Dr. Martinique 09/29/13 revealing nonobstructive disease. She denies chest pain or shortness of breath.  Hyperlipidemia History of hyperlipidemia on lovastatin followed by her PCP.      Crystal Harp MD FACP,FACC,FAHA, [facility] 12/18/2016 11:37 AM

## 2016-12-18 NOTE — Assessment & Plan Note (Signed)
History of essential hypertension with blood pressure measured today at 188/84. She is on hydrochlorothiazide and metoprolol. She says that she has "white coat syndrome". Blood pressure measured this morning at home was 132/70. Continue current meds are current dosing

## 2016-12-29 NOTE — Addendum Note (Signed)
Addended by: Milderd Meager on: 12/29/2016 09:16 AM   Modules accepted: Orders

## 2017-03-14 DIAGNOSIS — H15842 Scleral ectasia, left eye: Secondary | ICD-10-CM | POA: Diagnosis not present

## 2017-07-11 DIAGNOSIS — J069 Acute upper respiratory infection, unspecified: Secondary | ICD-10-CM | POA: Diagnosis not present

## 2017-07-11 DIAGNOSIS — J209 Acute bronchitis, unspecified: Secondary | ICD-10-CM | POA: Diagnosis not present

## 2017-08-02 DIAGNOSIS — I2510 Atherosclerotic heart disease of native coronary artery without angina pectoris: Secondary | ICD-10-CM | POA: Diagnosis not present

## 2017-08-02 DIAGNOSIS — I10 Essential (primary) hypertension: Secondary | ICD-10-CM | POA: Diagnosis not present

## 2017-08-02 DIAGNOSIS — E782 Mixed hyperlipidemia: Secondary | ICD-10-CM | POA: Diagnosis not present

## 2017-08-02 DIAGNOSIS — I679 Cerebrovascular disease, unspecified: Secondary | ICD-10-CM | POA: Diagnosis not present

## 2017-08-30 DIAGNOSIS — H9202 Otalgia, left ear: Secondary | ICD-10-CM | POA: Diagnosis not present

## 2017-08-30 DIAGNOSIS — H6123 Impacted cerumen, bilateral: Secondary | ICD-10-CM | POA: Diagnosis not present

## 2017-09-13 DIAGNOSIS — J449 Chronic obstructive pulmonary disease, unspecified: Secondary | ICD-10-CM | POA: Diagnosis not present

## 2017-09-13 DIAGNOSIS — I10 Essential (primary) hypertension: Secondary | ICD-10-CM | POA: Diagnosis not present

## 2017-09-13 DIAGNOSIS — J209 Acute bronchitis, unspecified: Secondary | ICD-10-CM | POA: Diagnosis not present

## 2017-11-10 DIAGNOSIS — E782 Mixed hyperlipidemia: Secondary | ICD-10-CM | POA: Diagnosis not present

## 2017-12-21 ENCOUNTER — Encounter: Payer: Self-pay | Admitting: Cardiovascular Disease

## 2017-12-21 ENCOUNTER — Ambulatory Visit: Payer: PPO | Admitting: Cardiovascular Disease

## 2017-12-21 DIAGNOSIS — I214 Non-ST elevation (NSTEMI) myocardial infarction: Secondary | ICD-10-CM | POA: Diagnosis not present

## 2017-12-21 DIAGNOSIS — E7800 Pure hypercholesterolemia, unspecified: Secondary | ICD-10-CM | POA: Diagnosis not present

## 2017-12-21 DIAGNOSIS — I10 Essential (primary) hypertension: Secondary | ICD-10-CM

## 2017-12-21 NOTE — Progress Notes (Signed)
12/21/2017 Crystal Day   04-19-1935  [id number]  Primary Physician Carol Ada, MD Primary Cardiologist: Lorretta Harp MD Lupe Carney, [place]  HPI:  Crystal Day is a 83 y.o.  mildly overweight widowed Caucasian female mother of 2 children, grandmother for grandchildren, patient of Dr. Carol Ada.  I last saw her in the office 12/18/2016.  She has a history of hypertension and hyperlipidemia. She was admitted to [facility] the hospital in February 2015 for chest pain and non-STEMI. She ultimately underwent cardiac catheterization by Dr. Peter Martinique 09/29/13 revealing nonobstructive CAD and medical therapy was recommended. She has had no recurrent cardiac symptoms. She did have a stroke however several years ago which she is recovering from. Since I saw her a year ago she is remained stable.  She apparently is statin intolerant.     Current Meds  Medication Sig  . hydrochlorothiazide (HYDRODIURIL) 25 MG tablet Take 25 mg by mouth every morning.  . metoprolol succinate (TOPROL-XL) 25 MG 24 hr tablet Take 25 mg by mouth daily.  . nitroGLYCERIN (NITROSTAT) 0.4 MG SL tablet Place 1 tablet (0.4 mg total) under the tongue every 5 (five) minutes as needed for chest pain.  Marland Kitchen nystatin cream (MYCOSTATIN) APPLY TO THE AFFECTED AREA(S) BY TOPICAL ROUTE 2 TIMES PER DAY  . triamcinolone ointment (KENALOG) 0.1 % APPLY A THIN LAYER TO THE AFFECTED AREA(S) BY TOPICAL ROUTE 2 TIMES PER DAY  . valsartan (DIOVAN) 160 MG tablet Take 1 tablet by mouth daily.     Allergies  Allergen Reactions  . Penicillins Hives  . Tetracyclines & Related Nausea Only  . Elavil [Amitriptyline] Rash  . Erythromycin Rash  . Macrodantin [Nitrofurantoin Macrocrystal] Rash    Social History   Socioeconomic History  . Marital status: Widowed    Spouse name: Not on file  . Number of children: Not on file  . Years of education: Not on file  . Highest education level: Not on file  Occupational History  . Not  on file  Social Needs  . Financial resource strain: Not on file  . Food insecurity:    Worry: Not on file    Inability: Not on file  . Transportation needs:    Medical: Not on file    Non-medical: Not on file  Tobacco Use  . Smoking status: Former Smoker    Packs/day: 0.25    Years: 5.00    Pack years: 1.25    Last attempt to quit: 08/17/1981    Years since quitting: 36.3  . Smokeless tobacco: Never Used  Substance and Sexual Activity  . Alcohol use: No  . Drug use: No  . Sexual activity: Not on file  Lifestyle  . Physical activity:    Days per week: Not on file    Minutes per session: Not on file  . Stress: Not on file  Relationships  . Social connections:    Talks on phone: Not on file    Gets together: Not on file    Attends religious service: Not on file    Active member of club or organization: Not on file    Attends meetings of clubs or organizations: Not on file    Relationship status: Not on file  . Intimate partner violence:    Fear of current or ex partner: Not on file    Emotionally abused: Not on file    Physically abused: Not on file    Forced sexual activity: Not  on file  Other Topics Concern  . Not on file  Social History Narrative  . Not on file     Review of Systems: General: negative for chills, fever, night sweats or weight changes.  Cardiovascular: negative for chest pain, dyspnea on exertion, edema, orthopnea, palpitations, paroxysmal nocturnal dyspnea or shortness of breath Dermatological: negative for rash Respiratory: negative for cough or wheezing Urologic: negative for hematuria Abdominal: negative for nausea, vomiting, diarrhea, bright red blood per rectum, melena, or hematemesis Neurologic: negative for visual changes, syncope, or dizziness All other systems reviewed and are otherwise negative except as noted above.    Blood pressure (!) 190/104, pulse 65, height 5\' 9"  (1.753 m), weight 156 lb 12.8 oz (71.1 kg).  General appearance:  alert and no distress Neck: no adenopathy, no carotid bruit, no JVD, supple, symmetrical, trachea midline and thyroid not enlarged, symmetric, no tenderness/mass/nodules Lungs: clear to auscultation bilaterally Heart: regular rate and rhythm, S1, S2 normal, no murmur, click, rub or gallop Extremities: extremities normal, atraumatic, no cyanosis or edema Pulses: 2+ and symmetric Skin: Skin color, texture, turgor normal. No rashes or lesions Neurologic: Alert and oriented X 3, normal strength and tone. Normal symmetric reflexes. Normal coordination and gait  EKG sinus rhythm at 65 civic ST and T wave changes.  I personally reviewed this EKG.  ASSESSMENT AND PLAN:   Essential hypertension History of essential hypertension with blood pressure measured today to check her blood pressure before coming in at home 135/70 . she is on hydrochlorothiazide and metoprolol as well as valsartan.  Continue current meds at current dosing.  NSTEMI (non-ST elevated myocardial infarction) History of non-STEMI cardiac catheterization performed by Dr. Martinique 09/29/2013 revealing nonobstructive CAD.  Medical therapy was recommended.  Hyperlipidemia History of hyperlipidemia with lipid profile last measured 10/03/2015 to 78 total LDL of 183.  She was on lovastatin briefly but was intolerant to statin therapy.  This is followed by her PCP.      Lorretta Harp MD FACP,FACC,FAHA, [facility] 12/21/2017 1:53 PM

## 2017-12-21 NOTE — Assessment & Plan Note (Signed)
History of hyperlipidemia with lipid profile last measured 10/03/2015 to 78 total LDL of 183.  She was on lovastatin briefly but was intolerant to statin therapy.  This is followed by her PCP.

## 2017-12-21 NOTE — Patient Instructions (Signed)

## 2017-12-21 NOTE — Assessment & Plan Note (Signed)
History of essential hypertension with blood pressure measured today to check her blood pressure before coming in at home 135/70 . she is on hydrochlorothiazide and metoprolol as well as valsartan.  Continue current meds at current dosing.

## 2017-12-21 NOTE — Assessment & Plan Note (Signed)
History of non-STEMI cardiac catheterization performed by Dr. Martinique 09/29/2013 revealing nonobstructive CAD.  Medical therapy was recommended.

## 2017-12-21 NOTE — Addendum Note (Signed)
Addended by: Zebedee Iba on: 12/21/2017 02:40 PM   Modules accepted: Orders

## 2018-03-18 DIAGNOSIS — M542 Cervicalgia: Secondary | ICD-10-CM | POA: Diagnosis not present

## 2018-03-18 DIAGNOSIS — R202 Paresthesia of skin: Secondary | ICD-10-CM | POA: Diagnosis not present

## 2018-03-18 DIAGNOSIS — I10 Essential (primary) hypertension: Secondary | ICD-10-CM | POA: Diagnosis not present

## 2018-06-01 DIAGNOSIS — I10 Essential (primary) hypertension: Secondary | ICD-10-CM | POA: Diagnosis not present

## 2018-06-01 DIAGNOSIS — J209 Acute bronchitis, unspecified: Secondary | ICD-10-CM | POA: Diagnosis not present

## 2018-06-15 ENCOUNTER — Ambulatory Visit
Admission: RE | Admit: 2018-06-15 | Discharge: 2018-06-15 | Disposition: A | Discharge status: Home / Self Care | Payer: PPO | Source: Ambulatory Visit | Attending: Family Medicine | Admitting: Family Medicine

## 2018-06-15 ENCOUNTER — Other Ambulatory Visit: Payer: Self-pay | Admitting: Family Medicine

## 2018-06-15 DIAGNOSIS — I10 Essential (primary) hypertension: Secondary | ICD-10-CM | POA: Diagnosis not present

## 2018-06-15 DIAGNOSIS — M542 Cervicalgia: Secondary | ICD-10-CM

## 2018-06-15 DIAGNOSIS — E782 Mixed hyperlipidemia: Secondary | ICD-10-CM | POA: Diagnosis not present

## 2018-06-15 DIAGNOSIS — M50322 Other cervical disc degeneration at C5-C6 level: Secondary | ICD-10-CM | POA: Diagnosis not present

## 2018-06-15 DIAGNOSIS — Z23 Encounter for immunization: Secondary | ICD-10-CM | POA: Diagnosis not present

## 2018-06-15 DIAGNOSIS — R200 Anesthesia of skin: Secondary | ICD-10-CM | POA: Diagnosis not present

## 2018-10-27 ENCOUNTER — Other Ambulatory Visit: Payer: Self-pay

## 2018-10-27 ENCOUNTER — Emergency Department (HOSPITAL_COMMUNITY)
Admission: EM | Admit: 2018-10-27 | Discharge: 2018-10-27 | Disposition: A | Discharge status: Home / Self Care | Payer: PPO | Attending: Emergency Medicine | Admitting: Emergency Medicine

## 2018-10-27 ENCOUNTER — Emergency Department (HOSPITAL_COMMUNITY): Payer: PPO

## 2018-10-27 DIAGNOSIS — Z87891 Personal history of nicotine dependence: Secondary | ICD-10-CM | POA: Insufficient documentation

## 2018-10-27 DIAGNOSIS — Z79899 Other long term (current) drug therapy: Secondary | ICD-10-CM | POA: Insufficient documentation

## 2018-10-27 DIAGNOSIS — R110 Nausea: Secondary | ICD-10-CM | POA: Diagnosis not present

## 2018-10-27 DIAGNOSIS — J209 Acute bronchitis, unspecified: Secondary | ICD-10-CM | POA: Diagnosis not present

## 2018-10-27 DIAGNOSIS — J40 Bronchitis, not specified as acute or chronic: Secondary | ICD-10-CM

## 2018-10-27 DIAGNOSIS — R42 Dizziness and giddiness: Secondary | ICD-10-CM | POA: Insufficient documentation

## 2018-10-27 DIAGNOSIS — R51 Headache: Secondary | ICD-10-CM | POA: Diagnosis not present

## 2018-10-27 DIAGNOSIS — R05 Cough: Secondary | ICD-10-CM | POA: Diagnosis not present

## 2018-10-27 DIAGNOSIS — I10 Essential (primary) hypertension: Secondary | ICD-10-CM | POA: Diagnosis not present

## 2018-10-27 DIAGNOSIS — R9431 Abnormal electrocardiogram [ECG] [EKG]: Secondary | ICD-10-CM | POA: Diagnosis not present

## 2018-10-27 LAB — COMPREHENSIVE METABOLIC PANEL
ALT: 10 U/L (ref 0–44)
AST: 24 U/L (ref 15–41)
Albumin: 3.8 g/dL (ref 3.5–5.0)
Alkaline Phosphatase: 56 U/L (ref 38–126)
Anion gap: 8 (ref 5–15)
BUN: 14 mg/dL (ref 8–23)
CO2: 26 mmol/L (ref 22–32)
Calcium: 9.2 mg/dL (ref 8.9–10.3)
Chloride: 105 mmol/L (ref 98–111)
Creatinine, Ser: 1.17 mg/dL — ABNORMAL HIGH (ref 0.44–1.00)
GFR calc Af Amer: 50 mL/min — ABNORMAL LOW (ref >60)
GFR calc non Af Amer: 43 mL/min — ABNORMAL LOW (ref >60)
Glucose, Bld: 113 mg/dL — ABNORMAL HIGH (ref 70–99)
Potassium: 4.1 mmol/L (ref 3.5–5.1)
Sodium: 139 mmol/L (ref 135–145)
Total Bilirubin: 0.6 mg/dL (ref 0.3–1.2)
Total Protein: 6.5 g/dL (ref 6.5–8.1)

## 2018-10-27 LAB — DIFFERENTIAL
Abs Immature Granulocytes: 0.03 10*3/uL (ref 0.00–0.07)
Basophils Absolute: 0.1 10*3/uL (ref 0.0–0.1)
Basophils Relative: 1 %
Eosinophils Absolute: 0.2 10*3/uL (ref 0.0–0.5)
Eosinophils Relative: 2 %
Immature Granulocytes: 0 %
Lymphocytes Relative: 16 %
Lymphs Abs: 1.2 10*3/uL (ref 0.7–4.0)
Monocytes Absolute: 0.5 10*3/uL (ref 0.1–1.0)
Monocytes Relative: 8 %
Neutro Abs: 5.2 10*3/uL (ref 1.7–7.7)
Neutrophils Relative %: 73 %

## 2018-10-27 LAB — CBC
HCT: 44.3 % (ref 36.0–46.0)
Hemoglobin: 14 g/dL (ref 12.0–15.0)
MCH: 27.7 pg (ref 26.0–34.0)
MCHC: 31.6 g/dL (ref 30.0–36.0)
MCV: 87.7 fL (ref 80.0–100.0)
Platelets: 186 10*3/uL (ref 150–400)
RBC: 5.05 MIL/uL (ref 3.87–5.11)
RDW: 12.4 % (ref 11.5–15.5)
WBC: 7.2 10*3/uL (ref 4.0–10.5)
nRBC: 0 % (ref 0.0–0.2)

## 2018-10-27 LAB — PROTIME-INR
INR: 1 (ref 0.8–1.2)
Prothrombin Time: 13 seconds (ref 11.4–15.2)

## 2018-10-27 LAB — APTT: aPTT: 26 seconds (ref 24–36)

## 2018-10-27 MED ORDER — AZITHROMYCIN 250 MG PO TABS: ORAL_TABLET | ORAL | 0 refills | Status: DC

## 2018-10-27 MED ORDER — SODIUM CHLORIDE 0.9% FLUSH
3.0000 mL | Freq: Once | INTRAVENOUS | Status: AC
  Administered 2018-10-27: 3 mL via INTRAVENOUS

## 2018-10-27 MED ORDER — ONDANSETRON HCL 4 MG/2ML IJ SOLN
4.0000 mg | Freq: Once | INTRAMUSCULAR | Status: AC
  Administered 2018-10-27: 4 mg via INTRAVENOUS
  Filled 2018-10-27: qty 2

## 2018-10-27 MED ORDER — MECLIZINE HCL 25 MG PO TABS
12.5000 mg | ORAL_TABLET | Freq: Once | ORAL | Status: AC
  Administered 2018-10-27: 12.5 mg via ORAL
  Filled 2018-10-27: qty 1

## 2018-10-27 MED ORDER — AZITHROMYCIN 250 MG PO TABS
500.0000 mg | ORAL_TABLET | Freq: Once | ORAL | Status: AC
  Administered 2018-10-27: 500 mg via ORAL
  Filled 2018-10-27: qty 2

## 2018-10-27 MED ORDER — SODIUM CHLORIDE 0.9 % IV BOLUS
1000.0000 mL | Freq: Once | INTRAVENOUS | Status: AC
  Administered 2018-10-27: 1000 mL via INTRAVENOUS

## 2018-10-27 MED ORDER — ONDANSETRON 4 MG PO TBDP: 4.0000 mg | ORAL_TABLET | Freq: Three times a day (TID) | ORAL | 0 refills | Status: DC | PRN

## 2018-10-27 MED ORDER — MECLIZINE HCL 25 MG PO TABS: 25.0000 mg | ORAL_TABLET | Freq: Three times a day (TID) | ORAL | 0 refills | Status: DC | PRN

## 2018-10-27 NOTE — ED Notes (Signed)
Pt ambulatory to and from hallway bathroom with only standby assistance by family member. Pt reports mild dizziness but improved.

## 2018-10-27 NOTE — ED Notes (Signed)
ED Provider at bedside. 

## 2018-10-27 NOTE — ED Notes (Signed)
Patient transported to X-ray 

## 2018-10-27 NOTE — ED Triage Notes (Signed)
Pt reports cold symptoms x 1 week. Pt reports she was working in the yard, reports she started having a severe "pop" in her head at about 2:30 pm. Pt reports feeling dizzy and nauseous after the pain. Pt denies weakness on one side or trouble talking. Reports hx of stroke and MI.

## 2018-10-27 NOTE — ED Provider Notes (Signed)
[facility] EMERGENCY DEPARTMENT Provider Note   CSN: [id number] Arrival date & time: 10/27/18  1707    History   Chief Complaint Chief Complaint  Patient presents with   Dizziness   Headache    HPI Crystal Day is a 84 y.o. female.     Pt presents to the ED today with headache and dizziness.  She has had URI sx for the past 2 weeks.  Today, she felt a pop in her head and then a severe headache and then dizziness.  She felt very nauseous when she felt dizzy.  The headache and dizziness has improved, but she feels a little dizzy still when she stands up.  No cp, but she has had a cough.  No fever.     Past Medical History:  Diagnosis Date   Anginal pain ([place]) since 2014   Arthritis    just in the hands   Cancer ([place]) 1988 or 89   basal cell carcinoma on face s/p Mohs procedure   Complication of anesthesia    nausea   History of wheezing    in spring and fall   Hyperlipidemia    Hypertension    Non-obstructive CAD    a. 09/2013 Cath/NSTEMI: nonobs dzs.   Non-STEMI (non-ST elevated myocardial infarction) ([place])    a. 09/2013 - peak trop 5.38;  b. 09/2013 Cath: nonobs dzs-->Med Rx.   Pneumonia    "in my 20's"   PONV (postoperative nausea and vomiting)    Stroke [facility] 2009   a. 09/2015 L Thalamic Lacunar infarct, presumed to be 2/2 small vessel dzs, residuals include numbness on right side of lips and right finger tips; b. 09/2015 Echo: nl LV fxn; c. 09/2015 Carotid U/S: mild bilat ICA stenosis.    Patient Active Problem List   Diagnosis Date Noted   Non-STEMI (non-ST elevated myocardial infarction) ([place])    Non-obstructive CAD    Hypertension    TIA (transient ischemic attack)    Lacunar infarct, acute ([place]) 10/03/2015   CKD (chronic kidney disease) stage 3, GFR 30-59 ml/min ([place]) 10/03/2015   Arterial ischemic stroke, vertebrobasilar, thalamic, acute, left (HCC)    HLD (hyperlipidemia)    OA (osteoarthritis) of knee  12/31/2014   Syncope 09/28/2014   Hyperlipidemia 04/17/2014   Atherosclerosis of native coronary artery of native heart without angina pectoris 10/05/2013   NSTEMI (non-ST elevated myocardial infarction) ([place]) 09/29/2013   Chest pain 09/28/2013   Essential hypertension 09/28/2013   Acute renal failure ([place]) 09/28/2013    Past Surgical History:  Procedure Laterality Date   ABDOMINAL HYSTERECTOMY  1974   BREAST SURGERY Left 1976   lumpectomy   EYE SURGERY     cataract surgery, both eyes   FRACTURE SURGERY Right 1971   thumb and index finger   LEFT HEART CATHETERIZATION WITH CORONARY ANGIOGRAM N/A 09/29/2013   Procedure: LEFT HEART CATHETERIZATION WITH CORONARY ANGIOGRAM;  Surgeon: Peter M Martinique, MD;  Location: [facility] CATH LAB;  Service: Cardiovascular;  Laterality: N/A;   PARTIAL KNEE ARTHROPLASTY Right 12/31/2014   Procedure: RIGHT KNEE MEDIAL UNICOMPARTMENTAL ARTHROPLASTY;  Surgeon: Gaynelle Arabian, MD;  Location: [facility] ORS;  Service: Orthopedics;  Laterality: Right;     OB History   No obstetric history on file.      Home Medications    Prior to Admission medications   Medication Sig Start Date End Date Taking? Authorizing Provider  azithromycin (ZITHROMAX) 250 MG tablet Take  1 every day until finished. 10/27/18  Isla Pence, MD  hydrochlorothiazide (HYDRODIURIL) 25 MG tablet Take 25 mg by mouth every morning.    [provider]  meclizine (ANTIVERT) 25 MG tablet Take 1 tablet (25 mg total) by mouth 3 (three) times daily as needed for dizziness. 10/27/18   Isla Pence, MD  metoprolol succinate (TOPROL-XL) 25 MG 24 hr tablet Take 25 mg by mouth daily.    [provider]  nitroGLYCERIN (NITROSTAT) 0.4 MG SL tablet Place 1 tablet (0.4 mg total) under the tongue every 5 (five) minutes as needed for chest pain. 03/11/16   Theora Gianotti, NP  nystatin cream (MYCOSTATIN) APPLY TO THE AFFECTED AREA(S) BY TOPICAL ROUTE 2 TIMES PER DAY 11/27/16    [provider]  ondansetron (ZOFRAN ODT) 4 MG disintegrating tablet Take 1 tablet (4 mg total) by mouth every 8 (eight) hours as needed. 10/27/18   Isla Pence, MD  triamcinolone ointment (KENALOG) 0.1 % APPLY A THIN LAYER TO THE AFFECTED AREA(S) BY TOPICAL ROUTE 2 TIMES PER DAY 11/27/16   [provider]  valsartan (DIOVAN) 160 MG tablet Take 1 tablet by mouth daily. 01/10/16   [provider]    Family History Family History  Problem Relation Age of Onset   Heart attack Father 22   Cancer Mother    Stroke Brother    Stroke Paternal Grandmother    Heart attack Maternal Grandfather     Social History Social History   Tobacco Use   Smoking status: Former Smoker    Packs/day: 0.25    Years: 5.00    Pack years: 1.25    Last attempt to quit: 08/17/1981    Years since quitting: 37.2   Smokeless tobacco: Never Used  Substance Use Topics   Alcohol use: No   Drug use: No     Allergies   Penicillins; Tetracyclines & related; Elavil [amitriptyline]; Erythromycin; and Macrodantin [nitrofurantoin macrocrystal]   Review of Systems Review of Systems  Gastrointestinal: Positive for nausea.  Neurological: Positive for dizziness and headaches.  All other systems reviewed and are negative.    Physical Exam Updated Vital Signs BP (!) 197/98    Pulse 70    Temp 98.2 F (36.8 C) (Oral)    Resp 16    SpO2 98%   Physical Exam Vitals signs and nursing note reviewed.  Constitutional:      Appearance: She is well-developed.  HENT:     Head: Normocephalic and atraumatic.     Mouth/Throat:     Mouth: Mucous membranes are moist.     Pharynx: Oropharynx is clear.  Eyes:     Extraocular Movements: Extraocular movements intact.     Pupils: Pupils are equal, round, and reactive to light.  Neck:     Musculoskeletal: Normal range of motion and neck supple.  Cardiovascular:     Rate and Rhythm: Normal rate and regular rhythm.  Pulmonary:     Effort:  Pulmonary effort is normal.     Breath sounds: Normal breath sounds.  Abdominal:     General: Bowel sounds are normal.     Palpations: Abdomen is soft.  Musculoskeletal: Normal range of motion.  Skin:    General: Skin is warm and dry.     Capillary Refill: Capillary refill takes less than 2 seconds.  Neurological:     Mental Status: She is alert and oriented to person, place, and time.  Psychiatric:        Mood and Affect: Mood normal.  Behavior: Behavior normal.      ED Treatments / Results  Labs (all labs ordered are listed, but only abnormal results are displayed) Labs Reviewed  COMPREHENSIVE METABOLIC PANEL - Abnormal; Notable for the following components:      Result Value   Glucose, Bld 113 (*)    Creatinine, Ser 1.17 (*)    GFR calc non Af Amer 43 (*)    GFR calc Af Amer 50 (*)    All other components within normal limits  PROTIME-INR  APTT  CBC  DIFFERENTIAL    EKG EKG Interpretation  Date/Time:  Thursday October 27 2018 17:20:42 EDT Ventricular Rate:  79 PR Interval:  154 QRS Duration: 86 QT Interval:  388 QTC Calculation: 444 R Axis:   53 Text Interpretation:  Normal sinus rhythm Biatrial enlargement Nonspecific ST abnormality Abnormal ECG No significant change since last tracing Confirmed by Isla Pence [phone]) on 10/27/2018 8:40:03 PM   Radiology Dg Chest 2 View  Result Date: 10/27/2018 CLINICAL DATA:  84 year old female with cough for 1 week. Dizziness. EXAM: CHEST - 2 VIEW COMPARISON:  02/10/2016 and earlier. FINDINGS: Stable large lung volumes. Mediastinal contours are stable and within normal limits. Visualized tracheal air column is within normal limits. Chronic apical scarring. No pneumothorax, pulmonary edema, pleural effusion or confluent pulmonary opacity. Calcified aortic atherosclerosis. No acute osseous abnormality identified. Negative visible bowel gas pattern. IMPRESSION: No acute cardiopulmonary abnormality. Electronically Signed    By: Genevie Ann M.D.   On: 10/27/2018 21:57   Ct Head Wo Contrast  Result Date: 10/27/2018 CLINICAL DATA:  Severe headache. EXAM: CT HEAD WITHOUT CONTRAST TECHNIQUE: Contiguous axial images were obtained from the base of the skull through the vertex without intravenous contrast. COMPARISON:  09/28/2014 FINDINGS: Brain: There is no evidence for acute hemorrhage, hydrocephalus, mass lesion, or abnormal extra-axial fluid collection. No definite CT evidence for acute infarction. Diffuse loss of parenchymal volume is consistent with atrophy. Patchy low attenuation in the deep hemispheric and periventricular white matter is nonspecific, but likely reflects chronic microvascular ischemic demyelination. Lacunar infarcts again noted in the thalamic nuclei bilaterally. Lacunar infarcts are also seen in the left cerebellum. Vascular: No hyperdense vessel or unexpected calcification. Skull: No evidence for fracture. No worrisome lytic or sclerotic lesion. Sinuses/Orbits: Mucosal thickening noted in the right maxillary sinus and scattered ethmoid air cells. Mastoid air cells and sphenoid sinuses are clear. Visualized portions of the globes and intraorbital fat are unremarkable. Other: None. IMPRESSION: Stable.  No acute intracranial abnormality. Atrophy with chronic small vessel white matter ischemic disease. Electronically Signed   By: Misty Stanley M.D.   On: 10/27/2018 18:59    Procedures Procedures (including critical care time)  Medications Ordered in ED Medications  azithromycin (ZITHROMAX) tablet 500 mg (has no administration in time range)  sodium chloride flush (NS) 0.9 % injection 3 mL (3 mLs Intravenous Given 10/27/18 2111)  sodium chloride 0.9 % bolus 1,000 mL (1,000 mLs Intravenous New Bag/Given 10/27/18 2115)  meclizine (ANTIVERT) tablet 12.5 mg (12.5 mg Oral Given 10/27/18 2119)  ondansetron (ZOFRAN) injection 4 mg (4 mg Intravenous Given 10/27/18 2118)     Initial Impression / Assessment and Plan / ED  Course  I have reviewed the triage vital signs and the nursing notes.  Pertinent labs & imaging results that were available during my care of the patient were reviewed by me and considered in my medical decision making (see chart for details).    Pt is feeling much better after  IVFs.  She is able to ambulate.  She has had URI sx for 2 weeks and so she will be treated with a zpack.  She said that is usually what works for her.  She knows to return if worse.  F/u with pcp.  Final Clinical Impressions(s) / ED Diagnoses   Final diagnoses:  Bronchitis  Vertigo    ED Discharge Orders         Ordered    azithromycin (ZITHROMAX) 250 MG tablet     10/27/18 2221    meclizine (ANTIVERT) 25 MG tablet  3 times daily PRN     10/27/18 2221    ondansetron (ZOFRAN ODT) 4 MG disintegrating tablet  Every 8 hours PRN     10/27/18 2221           Isla Pence, MD 10/27/18 2222

## 2018-10-27 NOTE — ED Notes (Signed)
Reviewed d/c instructions with pt, who verbalized understanding and had no outstanding questions. Pt departed in NAD.   

## 2018-10-31 DIAGNOSIS — J40 Bronchitis, not specified as acute or chronic: Secondary | ICD-10-CM | POA: Diagnosis not present

## 2018-10-31 DIAGNOSIS — I10 Essential (primary) hypertension: Secondary | ICD-10-CM | POA: Diagnosis not present

## 2018-10-31 DIAGNOSIS — H8110 Benign paroxysmal vertigo, unspecified ear: Secondary | ICD-10-CM | POA: Diagnosis not present

## 2019-03-28 DIAGNOSIS — I2510 Atherosclerotic heart disease of native coronary artery without angina pectoris: Secondary | ICD-10-CM | POA: Diagnosis not present

## 2019-03-28 DIAGNOSIS — Z1231 Encounter for screening mammogram for malignant neoplasm of breast: Secondary | ICD-10-CM | POA: Diagnosis not present

## 2019-03-28 DIAGNOSIS — Z0000 Encounter for general adult medical examination without abnormal findings: Secondary | ICD-10-CM | POA: Diagnosis not present

## 2019-03-28 DIAGNOSIS — E782 Mixed hyperlipidemia: Secondary | ICD-10-CM | POA: Diagnosis not present

## 2019-03-28 DIAGNOSIS — Z1389 Encounter for screening for other disorder: Secondary | ICD-10-CM | POA: Diagnosis not present

## 2019-03-28 DIAGNOSIS — E2839 Other primary ovarian failure: Secondary | ICD-10-CM | POA: Diagnosis not present

## 2019-03-28 DIAGNOSIS — I10 Essential (primary) hypertension: Secondary | ICD-10-CM | POA: Diagnosis not present

## 2019-04-26 ENCOUNTER — Inpatient Hospital Stay (HOSPITAL_COMMUNITY): Payer: PPO

## 2019-04-26 ENCOUNTER — Emergency Department (HOSPITAL_COMMUNITY): Payer: PPO

## 2019-04-26 ENCOUNTER — Encounter (HOSPITAL_COMMUNITY): Payer: Self-pay | Admitting: Emergency Medicine

## 2019-04-26 ENCOUNTER — Other Ambulatory Visit: Payer: Self-pay

## 2019-04-26 ENCOUNTER — Inpatient Hospital Stay (HOSPITAL_COMMUNITY)
Admission: EM | Admit: 2019-04-26 | Discharge: 2019-04-27 | DRG: 066 | Disposition: A | Discharge status: Home / Self Care | Payer: PPO | Attending: Internal Medicine | Admitting: Internal Medicine

## 2019-04-26 DIAGNOSIS — R29700 NIHSS score 0: Secondary | ICD-10-CM | POA: Diagnosis present

## 2019-04-26 DIAGNOSIS — I6381 Other cerebral infarction due to occlusion or stenosis of small artery: Principal | ICD-10-CM | POA: Diagnosis present

## 2019-04-26 DIAGNOSIS — I2510 Atherosclerotic heart disease of native coronary artery without angina pectoris: Secondary | ICD-10-CM

## 2019-04-26 DIAGNOSIS — J329 Chronic sinusitis, unspecified: Secondary | ICD-10-CM | POA: Diagnosis present

## 2019-04-26 DIAGNOSIS — I63311 Cerebral infarction due to thrombosis of right middle cerebral artery: Secondary | ICD-10-CM | POA: Diagnosis not present

## 2019-04-26 DIAGNOSIS — E041 Nontoxic single thyroid nodule: Secondary | ICD-10-CM | POA: Diagnosis present

## 2019-04-26 DIAGNOSIS — Z888 Allergy status to other drugs, medicaments and biological substances status: Secondary | ICD-10-CM | POA: Diagnosis not present

## 2019-04-26 DIAGNOSIS — G8324 Monoplegia of upper limb affecting left nondominant side: Secondary | ICD-10-CM | POA: Diagnosis not present

## 2019-04-26 DIAGNOSIS — Z8673 Personal history of transient ischemic attack (TIA), and cerebral infarction without residual deficits: Secondary | ICD-10-CM | POA: Diagnosis not present

## 2019-04-26 DIAGNOSIS — I25119 Atherosclerotic heart disease of native coronary artery with unspecified angina pectoris: Secondary | ICD-10-CM | POA: Diagnosis not present

## 2019-04-26 DIAGNOSIS — I160 Hypertensive urgency: Secondary | ICD-10-CM | POA: Diagnosis present

## 2019-04-26 DIAGNOSIS — Z96651 Presence of right artificial knee joint: Secondary | ICD-10-CM | POA: Diagnosis present

## 2019-04-26 DIAGNOSIS — Z881 Allergy status to other antibiotic agents status: Secondary | ICD-10-CM

## 2019-04-26 DIAGNOSIS — Z880 Allergy status to penicillin: Secondary | ICD-10-CM | POA: Diagnosis not present

## 2019-04-26 DIAGNOSIS — Z79899 Other long term (current) drug therapy: Secondary | ICD-10-CM

## 2019-04-26 DIAGNOSIS — Z8249 Family history of ischemic heart disease and other diseases of the circulatory system: Secondary | ICD-10-CM | POA: Diagnosis not present

## 2019-04-26 DIAGNOSIS — Z20828 Contact with and (suspected) exposure to other viral communicable diseases: Secondary | ICD-10-CM | POA: Diagnosis not present

## 2019-04-26 DIAGNOSIS — E785 Hyperlipidemia, unspecified: Secondary | ICD-10-CM | POA: Diagnosis not present

## 2019-04-26 DIAGNOSIS — Z85828 Personal history of other malignant neoplasm of skin: Secondary | ICD-10-CM | POA: Diagnosis not present

## 2019-04-26 DIAGNOSIS — E7800 Pure hypercholesterolemia, unspecified: Secondary | ICD-10-CM

## 2019-04-26 DIAGNOSIS — R51 Headache: Secondary | ICD-10-CM | POA: Diagnosis not present

## 2019-04-26 DIAGNOSIS — Z823 Family history of stroke: Secondary | ICD-10-CM

## 2019-04-26 DIAGNOSIS — Z03818 Encounter for observation for suspected exposure to other biological agents ruled out: Secondary | ICD-10-CM | POA: Diagnosis not present

## 2019-04-26 DIAGNOSIS — N183 Chronic kidney disease, stage 3 unspecified: Secondary | ICD-10-CM | POA: Diagnosis present

## 2019-04-26 DIAGNOSIS — I129 Hypertensive chronic kidney disease with stage 1 through stage 4 chronic kidney disease, or unspecified chronic kidney disease: Secondary | ICD-10-CM | POA: Diagnosis present

## 2019-04-26 DIAGNOSIS — I63213 Cerebral infarction due to unspecified occlusion or stenosis of bilateral vertebral arteries: Secondary | ICD-10-CM | POA: Diagnosis not present

## 2019-04-26 DIAGNOSIS — R200 Anesthesia of skin: Secondary | ICD-10-CM | POA: Diagnosis not present

## 2019-04-26 DIAGNOSIS — I252 Old myocardial infarction: Secondary | ICD-10-CM | POA: Diagnosis not present

## 2019-04-26 DIAGNOSIS — I340 Nonrheumatic mitral (valve) insufficiency: Secondary | ICD-10-CM | POA: Diagnosis not present

## 2019-04-26 DIAGNOSIS — Z90710 Acquired absence of both cervix and uterus: Secondary | ICD-10-CM | POA: Diagnosis not present

## 2019-04-26 DIAGNOSIS — N289 Disorder of kidney and ureter, unspecified: Secondary | ICD-10-CM | POA: Diagnosis not present

## 2019-04-26 DIAGNOSIS — Z87891 Personal history of nicotine dependence: Secondary | ICD-10-CM | POA: Diagnosis not present

## 2019-04-26 DIAGNOSIS — I361 Nonrheumatic tricuspid (valve) insufficiency: Secondary | ICD-10-CM

## 2019-04-26 DIAGNOSIS — I639 Cerebral infarction, unspecified: Secondary | ICD-10-CM

## 2019-04-26 DIAGNOSIS — I10 Essential (primary) hypertension: Secondary | ICD-10-CM | POA: Diagnosis not present

## 2019-04-26 LAB — CBC
HCT: 43.3 % (ref 36.0–46.0)
Hemoglobin: 14.2 g/dL (ref 12.0–15.0)
MCH: 29.3 pg (ref 26.0–34.0)
MCHC: 32.8 g/dL (ref 30.0–36.0)
MCV: 89.3 fL (ref 80.0–100.0)
Platelets: 176 10*3/uL (ref 150–400)
RBC: 4.85 MIL/uL (ref 3.87–5.11)
RDW: 12.4 % (ref 11.5–15.5)
WBC: 5.5 10*3/uL (ref 4.0–10.5)
nRBC: 0 % (ref 0.0–0.2)

## 2019-04-26 LAB — COMPREHENSIVE METABOLIC PANEL
ALT: 8 U/L (ref 0–44)
AST: 24 U/L (ref 15–41)
Albumin: 3.7 g/dL (ref 3.5–5.0)
Alkaline Phosphatase: 46 U/L (ref 38–126)
Anion gap: 11 (ref 5–15)
BUN: 15 mg/dL (ref 8–23)
CO2: 26 mmol/L (ref 22–32)
Calcium: 9.6 mg/dL (ref 8.9–10.3)
Chloride: 105 mmol/L (ref 98–111)
Creatinine, Ser: 1.17 mg/dL — ABNORMAL HIGH (ref 0.44–1.00)
GFR calc Af Amer: 50 mL/min — ABNORMAL LOW (ref >60)
GFR calc non Af Amer: 43 mL/min — ABNORMAL LOW (ref >60)
Glucose, Bld: 126 mg/dL — ABNORMAL HIGH (ref 70–99)
Potassium: 4.1 mmol/L (ref 3.5–5.1)
Sodium: 142 mmol/L (ref 135–145)
Total Bilirubin: 0.6 mg/dL (ref 0.3–1.2)
Total Protein: 6.6 g/dL (ref 6.5–8.1)

## 2019-04-26 LAB — LIPID PANEL
Cholesterol: 279 mg/dL — ABNORMAL HIGH (ref 0–200)
HDL: 43 mg/dL (ref >40)
LDL Cholesterol: 190 mg/dL — ABNORMAL HIGH (ref 0–99)
Total CHOL/HDL Ratio: 6.5 RATIO
Triglycerides: 230 mg/dL — ABNORMAL HIGH (ref <150)
VLDL: 46 mg/dL — ABNORMAL HIGH (ref 0–40)

## 2019-04-26 LAB — I-STAT CHEM 8, ED
BUN: 16 mg/dL (ref 8–23)
Calcium, Ion: 1.28 mmol/L (ref 1.15–1.40)
Chloride: 104 mmol/L (ref 98–111)
Creatinine, Ser: 1.2 mg/dL — ABNORMAL HIGH (ref 0.44–1.00)
Glucose, Bld: 115 mg/dL — ABNORMAL HIGH (ref 70–99)
HCT: 42 % (ref 36.0–46.0)
Hemoglobin: 14.3 g/dL (ref 12.0–15.0)
Potassium: 3.8 mmol/L (ref 3.5–5.1)
Sodium: 142 mmol/L (ref 135–145)
TCO2: 29 mmol/L (ref 22–32)

## 2019-04-26 LAB — DIFFERENTIAL
Abs Immature Granulocytes: 0.01 10*3/uL (ref 0.00–0.07)
Basophils Absolute: 0.1 10*3/uL (ref 0.0–0.1)
Basophils Relative: 2 %
Eosinophils Absolute: 0.4 10*3/uL (ref 0.0–0.5)
Eosinophils Relative: 7 %
Immature Granulocytes: 0 %
Lymphocytes Relative: 34 %
Lymphs Abs: 1.9 10*3/uL (ref 0.7–4.0)
Monocytes Absolute: 0.6 10*3/uL (ref 0.1–1.0)
Monocytes Relative: 10 %
Neutro Abs: 2.6 10*3/uL (ref 1.7–7.7)
Neutrophils Relative %: 47 %

## 2019-04-26 LAB — SARS CORONAVIRUS 2 BY RT PCR (HOSPITAL ORDER, PERFORMED IN ~~LOC~~ HOSPITAL LAB): SARS Coronavirus 2: NEGATIVE

## 2019-04-26 LAB — CBG MONITORING, ED: Glucose-Capillary: 95 mg/dL (ref 70–99)

## 2019-04-26 LAB — PROTIME-INR
INR: 0.9 (ref 0.8–1.2)
Prothrombin Time: 12.5 seconds (ref 11.4–15.2)

## 2019-04-26 LAB — ECHOCARDIOGRAM COMPLETE

## 2019-04-26 LAB — APTT: aPTT: 28 seconds (ref 24–36)

## 2019-04-26 MED ORDER — SODIUM CHLORIDE 0.9% FLUSH: 3.0000 mL | Freq: Once | INTRAVENOUS | Status: DC

## 2019-04-26 MED ORDER — LORAZEPAM 2 MG/ML IJ SOLN
1.0000 mg | Freq: Once | INTRAMUSCULAR | Status: AC
  Administered 2019-04-26: 1 mg via INTRAVENOUS
  Filled 2019-04-26: qty 1

## 2019-04-26 MED ORDER — ATORVASTATIN CALCIUM 80 MG PO TABS
80.0000 mg | ORAL_TABLET | Freq: Every day | ORAL | Status: DC
  Administered 2019-04-26: 80 mg via ORAL
  Filled 2019-04-26: qty 1

## 2019-04-26 MED ORDER — HYDROCHLOROTHIAZIDE 25 MG PO TABS: 25.0000 mg | ORAL_TABLET | Freq: Every day | ORAL | Status: DC

## 2019-04-26 MED ORDER — STROKE: EARLY STAGES OF RECOVERY BOOK
Freq: Once | Status: AC
  Administered 2019-04-27: 04:00:00
  Filled 2019-04-26: qty 1

## 2019-04-26 MED ORDER — ENOXAPARIN SODIUM 40 MG/0.4ML ~~LOC~~ SOLN
40.0000 mg | SUBCUTANEOUS | Status: DC
  Administered 2019-04-26: 40 mg via SUBCUTANEOUS
  Filled 2019-04-26 (×2): qty 0.4

## 2019-04-26 MED ORDER — ASPIRIN 325 MG PO TABS
325.0000 mg | ORAL_TABLET | Freq: Every day | ORAL | Status: DC
  Administered 2019-04-26: 325 mg via ORAL
  Filled 2019-04-26 (×2): qty 1

## 2019-04-26 MED ORDER — METOPROLOL TARTRATE 25 MG PO TABS
25.0000 mg | ORAL_TABLET | Freq: Two times a day (BID) | ORAL | Status: DC
  Administered 2019-04-26: 25 mg via ORAL
  Filled 2019-04-26: qty 1

## 2019-04-26 MED ORDER — IOHEXOL 350 MG/ML SOLN
75.0000 mL | Freq: Once | INTRAVENOUS | Status: AC | PRN
  Administered 2019-04-26: 75 mL via INTRAVENOUS

## 2019-04-26 MED ORDER — ASPIRIN 300 MG RE SUPP
300.0000 mg | Freq: Every day | RECTAL | Status: DC
  Filled 2019-04-26: qty 1

## 2019-04-26 MED ORDER — ACETAMINOPHEN 160 MG/5ML PO SOLN: 650.0000 mg | ORAL | Status: DC | PRN

## 2019-04-26 MED ORDER — ACETAMINOPHEN 325 MG PO TABS: 650.0000 mg | ORAL_TABLET | ORAL | Status: DC | PRN

## 2019-04-26 MED ORDER — ASPIRIN EC 81 MG PO TBEC
81.0000 mg | DELAYED_RELEASE_TABLET | Freq: Every day | ORAL | Status: DC
  Administered 2019-04-27: 81 mg via ORAL
  Filled 2019-04-26: qty 1

## 2019-04-26 MED ORDER — CLOPIDOGREL BISULFATE 75 MG PO TABS
75.0000 mg | ORAL_TABLET | Freq: Every day | ORAL | Status: DC
  Administered 2019-04-27: 75 mg via ORAL
  Filled 2019-04-26: qty 1

## 2019-04-26 MED ORDER — SODIUM CHLORIDE 0.9 % IV SOLN
Freq: Once | INTRAVENOUS | Status: AC
  Administered 2019-04-26: 13:00:00 via INTRAVENOUS

## 2019-04-26 MED ORDER — ACETAMINOPHEN 650 MG RE SUPP: 650.0000 mg | RECTAL | Status: DC | PRN

## 2019-04-26 NOTE — ED Notes (Signed)
Pt taken to CTA

## 2019-04-26 NOTE — Consult Note (Addendum)
Neurology Consultation  Reason for Consult: Subacute stroke Referring Physician: Tamala Julian  History is obtained from: Patient  HPI: Crystal Day is a 84 y.o. female with past medical history of prior thalamic and remote bilateral thalamic and cerebellar lacunar infarcts with no residual deficits, severe right P1 stenosis, unremarkable carotid ultrasound and transthoracic echo at that time, completed dual antiplatelet therapy and was advised to be on clopidogrel which she is not currently taking, pneumonia, non-STEMI, CAD, hypertension, hyperlipidemia, basal cell carcinoma and anginal pain.  Patient states that she went to bed at approximately 10:00 on Monday night woke up on Tuesday morning and noted that her left arm was weak and had decreased sensation.  In her words "it had a mind of its own when I tried to do anything with my left arm".  Since that time she does feel as though her strength has improved.  Patient states that she does not take aspirin and/or any form of lipid lowering medication.  Due to sensation of left arm weakness she came to emergency department to get further evaluation.  She denies any other neurological symptoms.  Of note she did state that on Tuesday she felt this low bilateral legs felt weak.  Chart review: On 02/2016 patient did have a left medial anterior thalamic infarct, but also remote bilateral thalamic and cerebellar lacunar infarcts.  She was seen by Dr. Erlinda Hong at that time.  She was placed on Plavix and pravastatin for prevention.  Patient states that she took herself off the pravastatin because of what she had seen on commercials on TV.  LKW: Approximately 10 PM on 04/24/2019 tpa given?: no, out of window Premorbid modified Rankin scale (mRS): 0 NIH stroke scale of 0   ROS: A 14 point ROS was performed and is negative except as noted in the HPI.  Past Medical History:  Diagnosis Date  . Anginal pain ([place]) since 2014  . Arthritis    just in the hands  . Cancer  ([place]) 1988 or 89   basal cell carcinoma on face s/p Mohs procedure  . Complication of anesthesia    nausea  . History of wheezing    in spring and fall  . Hyperlipidemia   . Hypertension   . Non-obstructive CAD    a. 09/2013 Cath/NSTEMI: nonobs dzs.  . Non-STEMI (non-ST elevated myocardial infarction) ([place])    a. 09/2013 - peak trop 5.38;  b. 09/2013 Cath: nonobs dzs-->Med Rx.  . Pneumonia    "in my 20's"  . PONV (postoperative nausea and vomiting)   . Stroke [facility]) 2009   a. 09/2015 L Thalamic Lacunar infarct, presumed to be 2/2 small vessel dzs, residuals include numbness on right side of lips and right finger tips; b. 09/2015 Echo: nl LV fxn; c. 09/2015 Carotid U/S: mild bilat ICA stenosis.    Family History  Problem Relation Age of Onset  . Heart attack Father 61  . Cancer Mother   . Stroke Brother   . Stroke Paternal Grandmother   . Heart attack Maternal Grandfather    Social History:   reports that she quit smoking about 37 years ago. She has a 1.25 pack-year smoking history. She has never used smokeless tobacco. She reports that she does not drink alcohol or use drugs.  Medications  Current Facility-Administered Medications:  .  sodium chloride flush (NS) 0.9 % injection 3 mL, 3 mL, Intravenous, Once, Delora Fuel, MD  Current Outpatient Medications:  .  hydrochlorothiazide (HYDRODIURIL) 25 MG tablet,  Take 25 mg by mouth daily. , Disp: , Rfl:  .  metoprolol tartrate (LOPRESSOR) 25 MG tablet, Take 25 mg by mouth 2 (two) times daily., Disp: , Rfl:  .  nitroGLYCERIN (NITROSTAT) 0.4 MG SL tablet, Place 1 tablet (0.4 mg total) under the tongue every 5 (five) minutes as needed for chest pain., Disp: 25 tablet, Rfl: 3 .  azithromycin (ZITHROMAX) 250 MG tablet, Take  1 every day until finished. (Patient not taking: Reported on 04/26/2019), Disp: 4 tablet, Rfl: 0 .  meclizine (ANTIVERT) 25 MG tablet, Take 1 tablet (25 mg total) by mouth 3 (three) times daily as needed for dizziness.  (Patient not taking: Reported on 04/26/2019), Disp: 30 tablet, Rfl: 0 .  ondansetron (ZOFRAN ODT) 4 MG disintegrating tablet, Take 1 tablet (4 mg total) by mouth every 8 (eight) hours as needed. (Patient not taking: Reported on 04/26/2019), Disp: 10 tablet, Rfl: 0   Exam: Current vital signs: BP (!) 157/86   Pulse (!) 56   Temp 98.1 F (36.7 C) (Oral)   Resp 19   SpO2 95%  Vital signs in last 24 hours: Temp:  [98 F (36.7 C)-98.3 F (36.8 C)] 98.1 F (36.7 C) (09/09 0501) Pulse Rate:  [56-69] 56 (09/09 0815) Resp:  [14-19] 19 (09/09 0815) BP: (157-220)/(65-109) 157/86 (09/09 0815) SpO2:  [95 %-99 %] 95 % (09/09 0815)  Physical Exam  Constitutional: Appears well-developed and well-nourished.  Psych: Affect appropriate to situation Eyes: No scleral injection HENT: No OP obstrucion Head: Normocephalic.  Cardiovascular: Normal rate and regular rhythm.  Respiratory: Effort normal, non-labored breathing GI: Soft.  No distension. There is no tenderness.  Skin: WDI  Neuro: Mental Status: Patient is awake, alert, oriented to person, place, month. Patient is able to give a clear and coherent history. No signs of aphasia or neglect Cranial Nerves: II: Visual Fields are full.  III,IV, VI: EOMI with right eye ptosis but no diploplia. Pupils equal, round and reactive to light V: Facial sensation is symmetric to temperature VII: Facial movement is symmetric.  VIII: hearing is intact to voice X: Palat elevates symmetrically XI: Shoulder shrug is symmetric. XII: tongue is midline without atrophy or fasciculations.  Motor: Tone is normal. Bulk is normal. 5/5 strength was present in all four extremities.  She does have a weaker grip on the left Sensory: Sensation is symmetric to light touch and temperature in the arms and legs. Deep Tendon Reflexes: 1+ in the upper extremities with no deep tendon reflexes at knee jerk or ankle jerk Plantars: Toes are downgoing bilaterally.   Cerebellar: FNF and HKS are intact bilaterally  Labs I have reviewed labs in epic and the results pertinent to this consultation are:   CBC    Component Value Date/Time   WBC 5.5 04/26/2019 0016   RBC 4.85 04/26/2019 0016   HGB 14.3 04/26/2019 0115   HCT 42.0 04/26/2019 0115   PLT 176 04/26/2019 0016   MCV 89.3 04/26/2019 0016   MCH 29.3 04/26/2019 0016   MCHC 32.8 04/26/2019 0016   RDW 12.4 04/26/2019 0016   LYMPHSABS 1.9 04/26/2019 0016   MONOABS 0.6 04/26/2019 0016   EOSABS 0.4 04/26/2019 0016   BASOSABS 0.1 04/26/2019 0016    CMP     Component Value Date/Time   NA 142 04/26/2019 0115   K 3.8 04/26/2019 0115   CL 104 04/26/2019 0115   CO2 26 04/26/2019 0016   GLUCOSE 115 (H) 04/26/2019 0115   BUN 16 04/26/2019 0115  CREATININE 1.20 (H) 04/26/2019 0115   CALCIUM 9.6 04/26/2019 0016   PROT 6.6 04/26/2019 0016   ALBUMIN 3.7 04/26/2019 0016   AST 24 04/26/2019 0016   ALT 8 04/26/2019 0016   ALKPHOS 46 04/26/2019 0016   BILITOT 0.6 04/26/2019 0016   GFRNONAA 43 (L) 04/26/2019 0016   GFRAA 50 (L) 04/26/2019 0016    Lipid Panel     Component Value Date/Time   CHOL 278 (H) 10/03/2015 1658   TRIG 272 (H) 10/03/2015 1658   HDL 41 10/03/2015 1658   CHOLHDL 6.8 10/03/2015 1658   VLDL 54 (H) 10/03/2015 1658   LDLCALC 183 (H) 10/03/2015 1658     Imaging I have reviewed the images obtained:  CT-scan of the brain- no acute intracranial abnormality  MRI examination of the brain- small acute lacunar infarct in the posterior right centrum semiovale near the motor strip.  Etta Quill PA-C [facility] [phone]  M-F  (9:00 am- 5:00 PM)  04/26/2019, 10:24 AM    Attending addendum Patient seen and examined Complains of left arm weakness and bilateral leg weakness-all symptoms improving. MRI reviewed by me- small infarct in the posterior right centrum semiovale near the motor strip.  Likely small vessel- cannot rule out embolic  Assessment:   This is a 84 year old female presenting initially with left arm weakness now improved.  MRI does confirm a small acute lacunar infarct in the posterior right centrum semiovale near the motor strip.  Patient has taken herself off of her cholesterol medications and does show a LDL that is significantly elevated at 183.  Most likely etiology is artery to artery.  Patient will need stroke work-up.  She will need to be put back on a statin and also on antiplatelet.  Recommendations: #CTA of head and neck #Transthoracic Echo  # Start patient on ASA 325mg  daily,   #Start Atorvastatin 80 mg/other high intensity statin # BP goal: permissive HTN upto 220/120 mmHg with goal to normotensive within the next few days # HBAIC and Lipid profile # Telemetry monitoring # Frequent neuro checks # NPO until passes stroke swallow screen # please page stroke NP  Or  PA  Or MD from 8am -4 pm  as this patient from this time will be  followed by the stroke.   You can look them up on [email or web address]  Password [email or web address]    -- Amie Portland, MD [facility] Pager: [phone] If 7pm to 7am, please call on call as listed on AMION.

## 2019-04-26 NOTE — ED Notes (Signed)
Pt is in MRI  

## 2019-04-26 NOTE — H&P (Signed)
History and Physical    Crystal Day [id number] DOB: 1935-01-08 DOA: 04/26/2019  Referring MD/NP/PA: Tomasa Rand, MD PCP: Carol Ada, MD  Patient coming from: Home  Chief Complaint: Left arm numbness and weakness  I have personally briefly reviewed patient's old medical records in [place]   HPI: Crystal Day is a 84 y.o. right-handed female with medical history significant of HTN, CAD, CVA/TIA, and remote tobacco use; who presented with complaints of left arm numbness and weakness starting yesterday morning when she woke up.  Last known normal was around 11:30 p.m. the night before.  Patient reports that her left arm felt heavy and was unable to pick up her morning coffee.  Notes that she tried to reach for things and felt like left arm was falling.  Associated symptoms include complaints of weakness above lower extremities and a headache on the top of her head which is unusual for her.  At baseline patient complains of some mild shortness of breath with exertion, but states that this is unchanged.  Denies having any change in speech, change in vision, fever, chest pain, palpitations, nausea, vomiting, dysuria, or diarrhea.  Previously had a history of CVA back in 2017 placed on Plavix and pravastatin.  However, patient reportedly took herself off of the pravastatin due to side effects seen on TV.  ED Course: Upon admission into the emergency department patient was noted to be afebrile, pulse 57-69, blood pressure 157/86-220/99, and all other vital signs maintained.  Presented as a code stroke.  CT scan of the brain showed no acute abnormalities and chronic sinusitis.  She did not meet criteria for TPA.  Labs revealed BUN 15, creatinine 1.17, glucose 126, and all other labs relatively within normal limits.  MRI of the brain did reveal signs of a acute posterior right centrum semiovale infarct with advanced chronic small vessel disease.  COVID-19 screen was negative.  TRH called  to admit for completion of stroke work-up.  Past Medical History:  Diagnosis Date   Anginal pain ([place]) since 2014   Arthritis    just in the hands   Cancer ([place]) 1988 or 89   basal cell carcinoma on face s/p Mohs procedure   Complication of anesthesia    nausea   History of wheezing    in spring and fall   Hyperlipidemia    Hypertension    Non-obstructive CAD    a. 09/2013 Cath/NSTEMI: nonobs dzs.   Non-STEMI (non-ST elevated myocardial infarction) ([place])    a. 09/2013 - peak trop 5.38;  b. 09/2013 Cath: nonobs dzs-->Med Rx.   Pneumonia    "in my 20's"   PONV (postoperative nausea and vomiting)    Stroke [facility]) 2009   a. 09/2015 L Thalamic Lacunar infarct, presumed to be 2/2 small vessel dzs, residuals include numbness on right side of lips and right finger tips; b. 09/2015 Echo: nl LV fxn; c. 09/2015 Carotid U/S: mild bilat ICA stenosis.    Past Surgical History:  Procedure Laterality Date   ABDOMINAL HYSTERECTOMY  1974   BREAST SURGERY Left 1976   lumpectomy   EYE SURGERY     cataract surgery, both eyes   FRACTURE SURGERY Right 1971   thumb and index finger   LEFT HEART CATHETERIZATION WITH CORONARY ANGIOGRAM N/A 09/29/2013   Procedure: LEFT HEART CATHETERIZATION WITH CORONARY ANGIOGRAM;  Surgeon: Peter M Martinique, MD;  Location: [facility] CATH LAB;  Service: Cardiovascular;  Laterality: N/A;   PARTIAL KNEE ARTHROPLASTY Right 12/31/2014  Procedure: RIGHT KNEE MEDIAL UNICOMPARTMENTAL ARTHROPLASTY;  Surgeon: Gaynelle Arabian, MD;  Location: [facility] ORS;  Service: Orthopedics;  Laterality: Right;     reports that she quit smoking about 37 years ago. She has a 1.25 pack-year smoking history. She has never used smokeless tobacco. She reports that she does not drink alcohol or use drugs.  Allergies  Allergen Reactions   Penicillins Hives   Tetracyclines & Related Nausea Only   Elavil [Amitriptyline] Rash   Erythromycin Rash   Macrodantin [Nitrofurantoin Macrocrystal] Rash     Family History  Problem Relation Age of Onset   Heart attack Father 51   Cancer Mother    Stroke Brother    Stroke Paternal Grandmother    Heart attack Maternal Grandfather     Prior to Admission medications   Medication Sig Start Date End Date Taking? Authorizing Provider  hydrochlorothiazide (HYDRODIURIL) 25 MG tablet Take 25 mg by mouth daily.    Yes [provider]  metoprolol tartrate (LOPRESSOR) 25 MG tablet Take 25 mg by mouth 2 (two) times daily. 03/28/19  Yes [provider]  nitroGLYCERIN (NITROSTAT) 0.4 MG SL tablet Place 1 tablet (0.4 mg total) under the tongue every 5 (five) minutes as needed for chest pain. 03/11/16  Yes Theora Gianotti, NP  azithromycin (ZITHROMAX) 250 MG tablet Take  1 every day until finished. Patient not taking: Reported on 04/26/2019 10/27/18   Isla Pence, MD  meclizine (ANTIVERT) 25 MG tablet Take 1 tablet (25 mg total) by mouth 3 (three) times daily as needed for dizziness. Patient not taking: Reported on 04/26/2019 10/27/18   Isla Pence, MD  ondansetron (ZOFRAN ODT) 4 MG disintegrating tablet Take 1 tablet (4 mg total) by mouth every 8 (eight) hours as needed. Patient not taking: Reported on 04/26/2019 10/27/18   Isla Pence, MD    Physical Exam:  Constitutional: Alert female currently in NAD, calm, comfortable Vitals:   04/26/19 0600 04/26/19 0630 04/26/19 0645 04/26/19 0815  BP: (!) 220/99 (!) 186/94 (!) 194/84 (!) 157/86  Pulse: 69 (!) 58 61 (!) 56  Resp: 17 16 19 19   Temp:      TempSrc:      SpO2: 97% 98% 99% 95%   Eyes: PERRL, lids and conjunctivae normal ENMT: Mucous membranes are moist. Posterior pharynx clear of any exudate or lesions.  Neck: normal, supple, no masses, no thyromegaly Respiratory: clear to auscultation bilaterally, no wheezing, no crackles. Normal respiratory effort. No accessory muscle use.  Cardiovascular: Regular rate and rhythm, no murmurs / rubs / gallops. No extremity  edema. 2+ pedal pulses. No carotid bruits.  Abdomen: no tenderness, no masses palpated. No hepatosplenomegaly. Bowel sounds positive.  Musculoskeletal: no clubbing / cyanosis. No joint deformity upper and lower extremities. Good ROM, no contractures. Normal muscle tone.  Skin: no rashes, lesions, ulcers. No induration Neurologic: CN 2-12 grossly intact. Sensation intact, DTR normal. Strength 5/5 in all extremities trace except for the left upper extremity which is 4/5. Psychiatric: Normal judgment and insight. Alert and oriented x 3. Normal mood.     Labs on Admission: I have personally reviewed following labs and imaging studies  CBC: Recent Labs  Lab 04/26/19 0016 04/26/19 0115  WBC 5.5  --   NEUTROABS 2.6  --   HGB 14.2 14.3  HCT 43.3 42.0  MCV 89.3  --   PLT 176  --    Basic Metabolic Panel: Recent Labs  Lab 04/26/19 0016 04/26/19 0115  NA 142 142  K 4.1 3.8  CL 105 104  CO2 26  --   GLUCOSE 126* 115*  BUN 15 16  CREATININE 1.17* 1.20*  CALCIUM 9.6  --    GFR: CrCl cannot be calculated (Unknown ideal weight.). Liver Function Tests: Recent Labs  Lab 04/26/19 0016  AST 24  ALT 8  ALKPHOS 46  BILITOT 0.6  PROT 6.6  ALBUMIN 3.7   No results for input(s): LIPASE, AMYLASE in the last 168 hours. No results for input(s): AMMONIA in the last 168 hours. Coagulation Profile: Recent Labs  Lab 04/26/19 0016  INR 0.9   Cardiac Enzymes: No results for input(s): CKTOTAL, CKMB, CKMBINDEX, TROPONINI in the last 168 hours. BNP (last 3 results) No results for input(s): PROBNP in the last 8760 hours. HbA1C: No results for input(s): HGBA1C in the last 72 hours. CBG: Recent Labs  Lab 04/26/19 0600  GLUCAP 95   Lipid Profile: No results for input(s): CHOL, HDL, LDLCALC, TRIG, CHOLHDL, LDLDIRECT in the last 72 hours. Thyroid Function Tests: No results for input(s): TSH, T4TOTAL, FREET4, T3FREE, THYROIDAB in the last 72 hours. Anemia Panel: No results for  input(s): VITAMINB12, FOLATE, FERRITIN, TIBC, IRON, RETICCTPCT in the last 72 hours. Urine analysis:    Component Value Date/Time   COLORURINE YELLOW 12/26/2014 1030   APPEARANCEUR CLEAR 12/26/2014 1030   LABSPEC 1.020 12/26/2014 1030   PHURINE 5.0 12/26/2014 1030   GLUCOSEU NEGATIVE 12/26/2014 1030   HGBUR NEGATIVE 12/26/2014 1030   BILIRUBINUR NEGATIVE 12/26/2014 1030   KETONESUR NEGATIVE 12/26/2014 1030   PROTEINUR NEGATIVE 12/26/2014 1030   UROBILINOGEN 0.2 12/26/2014 1030   NITRITE NEGATIVE 12/26/2014 1030   LEUKOCYTESUR NEGATIVE 12/26/2014 1030   Sepsis Labs: No results found for this or any previous visit (from the past 240 hour(s)).   Radiological Exams on Admission: Ct Head Wo Contrast  Result Date: 04/26/2019 CLINICAL DATA:  Headache, left arm numbness EXAM: CT HEAD WITHOUT CONTRAST TECHNIQUE: Contiguous axial images were obtained from the base of the skull through the vertex without intravenous contrast. COMPARISON:  10/27/2018 FINDINGS: Brain: No acute intracranial abnormality. Specifically, no hemorrhage, hydrocephalus, mass lesion, acute infarction, or significant intracranial injury. Vascular: No hyperdense vessel or unexpected calcification. Skull: No acute calvarial abnormality. Sinuses/Orbits: Mucosal thickening throughout the ethmoid air cells and right maxillary sinus. No air-fluid levels. Other: None IMPRESSION: No acute intracranial abnormality. Chronic sinusitis. Electronically Signed   By: Rolm Baptise M.D.   On: 04/26/2019 01:03   Mr Brain Wo Contrast  Result Date: 04/26/2019 CLINICAL DATA:  84 year old female with left upper extremity heaviness, difficulty holding things in her hand. Headache on presentation, now resolved. EXAM: MRI HEAD WITHOUT CONTRAST TECHNIQUE: Multiplanar, multiecho pulse sequences of the brain and surrounding structures were obtained without intravenous contrast. COMPARISON:  Head CT earlier today.  Brain MRI 10/02/2015. FINDINGS: Brain: Small  linear 7 millimeter focus of restricted diffusion in the posterior right centrum semiovale near the motor strip (series 2, image 35). Faint associated T2 and FLAIR hyperintensity. No associated hemorrhage or mass effect. No other restricted diffusion. No midline shift, mass effect, evidence of mass lesion, ventriculomegaly, extra-axial collection or acute intracranial hemorrhage. Cervicomedullary junction and pituitary are within normal limits. Multiple chronic lacunar infarcts in both thalami appear similar to the 2017 exam. Scattered small chronic infarcts in both cerebellar hemispheres are stable. Superimposed periventricular bilateral cerebral white matter T2 and FLAIR hyperintensity which also resembles small chronic infarcts on series 6, image 17. No cortical encephalomalacia. No definite chronic blood products.  The basal ganglia and brainstem remain normal for age. Vascular: Major intracranial vascular flow voids are stable since 2017. Skull and upper cervical spine: Mild for age. Normal bone marrow disc degeneration in the visible cervical spine signal. Sinuses/Orbits: Stable orbits. Increased mild to moderate paranasal sinus mucosal thickening. Other: Mastoids remain clear.  Negative scalp and face soft tissues. IMPRESSION: 1. Small acute lacunar infarct in the posterior right centrum semiovale near the motor strip. No associated hemorrhage or mass effect. 2. Moderately advanced chronic small vessel ischemia, that the thalami and cerebellum is stable since 2017. Electronically Signed   By: Genevie Ann M.D.   On: 04/26/2019 08:19    EKG: Independently reviewed.  Sinus rhythm at 67 bpm with signs of biatrial enlargement similar to previous tracings.  Assessment/Plan  CVA: Acute/subacute.  Patient presents with complaints of left arm numbness and weakness starting yesterday morning when she awoke.  MRI reveals an acute right centrum semiovale infarct. - Admit to telemetry bed - Stroke order set  initiated - Neuro checks - Check CT angiogram of the head and neck - PT/OT/Speech to evaluate and treat - Check echocardiogram - Check Hemoglobin A1c and lipid panel in a.m. - 325 mg aspirin and statin - Follow-up telemetry overnight - Care management consult - Appreciate neurology consultative services, will follow-up   Hypertensive urgency: Acute.  Blood pressures initially elevated up to 220/99. - Allow for permissive hypertension to 220/110  - Goal normotensive over the next few days  - Restart metoprolol and hydrochlorothiazide when medically appropriate  Chronic kidney disease stage III: Chronic.  On admission creatinine noted to be 1.17 which appears to be the patient's baseline. - Continue to monitor  Nonobstructive CAD - Continue aspirin and statin  Hyperlipidemia - Follow-up lipid panel - Atorvastatin 80 mg  DVT prophylaxis: Lovenox Code Status: Full  Family Communication: Discussed plan of care with the patient and family present at bedside Disposition Plan: TBD Consults called: Neurology  Admission status: inpatient  Norval Morton MD [facility] Pager [phone]   If 7PM-7AM, please contact night-coverage [email or web address] Password [email or web address]  04/26/2019, 10:06 AM

## 2019-04-26 NOTE — ED Notes (Signed)
Back from MRI.

## 2019-04-26 NOTE — ED Provider Notes (Signed)
[place] EMERGENCY DEPARTMENT Provider Note   CSN: [id number] Arrival date & time: 04/26/19  0004    History   Chief Complaint Chief Complaint  Patient presents with  . Numbness    HPI Crystal Day is a 84 y.o. female.   The history is provided by the patient.  She has history of hypertension, hyperlipidemia, coronary artery disease, stroke, chronic kidney disease and comes in with numbness in her left arm since she woke up yesterday morning.  She states her arm has felt heavy.  She has had a hard time holding things in her hand.  Symptoms do seem to be improving but are still present.  She also states that her left leg has felt funny but not exactly heavy.  She also started having a headache when she arrived in the ED, but that has resolved.  Past Medical History:  Diagnosis Date  . Anginal pain ([place]) since 2014  . Arthritis    just in the hands  . Cancer ([place]) 1988 or 89   basal cell carcinoma on face s/p Mohs procedure  . Complication of anesthesia    nausea  . History of wheezing    in spring and fall  . Hyperlipidemia   . Hypertension   . Non-obstructive CAD    a. 09/2013 Cath/NSTEMI: nonobs dzs.  . Non-STEMI (non-ST elevated myocardial infarction) ([place])    a. 09/2013 - peak trop 5.38;  b. 09/2013 Cath: nonobs dzs-->Med Rx.  . Pneumonia    "in my 20's"  . PONV (postoperative nausea and vomiting)   . Stroke [facility]) 2009   a. 09/2015 L Thalamic Lacunar infarct, presumed to be 2/2 small vessel dzs, residuals include numbness on right side of lips and right finger tips; b. 09/2015 Echo: nl LV fxn; c. 09/2015 Carotid U/S: mild bilat ICA stenosis.    Patient Active Problem List   Diagnosis Date Noted  . Non-STEMI (non-ST elevated myocardial infarction) ([place])   . Non-obstructive CAD   . Hypertension   . TIA (transient ischemic attack)   . Lacunar infarct, acute ([place]) 10/03/2015  . CKD (chronic kidney disease) stage 3, GFR 30-59 ml/min ([facility]) 10/03/2015   . Arterial ischemic stroke, vertebrobasilar, thalamic, acute, left ([place])   . HLD (hyperlipidemia)   . OA (osteoarthritis) of knee 12/31/2014  . Syncope 09/28/2014  . Hyperlipidemia 04/17/2014  . Atherosclerosis of native coronary artery of native heart without angina pectoris 10/05/2013  . NSTEMI (non-ST elevated myocardial infarction) ([place]) 09/29/2013  . Chest pain 09/28/2013  . Essential hypertension 09/28/2013  . Acute renal failure ([place]) 09/28/2013    Past Surgical History:  Procedure Laterality Date  . ABDOMINAL HYSTERECTOMY  1974  . BREAST SURGERY Left 1976   lumpectomy  . EYE SURGERY     cataract surgery, both eyes  . FRACTURE SURGERY Right 1971   thumb and index finger  . LEFT HEART CATHETERIZATION WITH CORONARY ANGIOGRAM N/A 09/29/2013   Procedure: LEFT HEART CATHETERIZATION WITH CORONARY ANGIOGRAM;  Surgeon: Peter M Martinique, MD;  Location: [facility] CATH LAB;  Service: Cardiovascular;  Laterality: N/A;  . PARTIAL KNEE ARTHROPLASTY Right 12/31/2014   Procedure: RIGHT KNEE MEDIAL UNICOMPARTMENTAL ARTHROPLASTY;  Surgeon: Gaynelle Arabian, MD;  Location: [facility] ORS;  Service: Orthopedics;  Laterality: Right;     OB History   No obstetric history on file.      Home Medications    Prior to Admission medications   Medication Sig Start Date End Date Taking? Authorizing Provider  azithromycin (ZITHROMAX) 250 MG tablet Take  1 every day until finished. 10/27/18   Isla Pence, MD  hydrochlorothiazide (HYDRODIURIL) 25 MG tablet Take 25 mg by mouth every morning.    [provider]  meclizine (ANTIVERT) 25 MG tablet Take 1 tablet (25 mg total) by mouth 3 (three) times daily as needed for dizziness. 10/27/18   Isla Pence, MD  metoprolol succinate (TOPROL-XL) 25 MG 24 hr tablet Take 25 mg by mouth daily.    [provider]  nitroGLYCERIN (NITROSTAT) 0.4 MG SL tablet Place 1 tablet (0.4 mg total) under the tongue every 5 (five) minutes as needed for chest pain. 03/11/16    Theora Gianotti, NP  nystatin cream (MYCOSTATIN) APPLY TO THE AFFECTED AREA(S) BY TOPICAL ROUTE 2 TIMES PER DAY 11/27/16   [provider]  ondansetron (ZOFRAN ODT) 4 MG disintegrating tablet Take 1 tablet (4 mg total) by mouth every 8 (eight) hours as needed. 10/27/18   Isla Pence, MD  triamcinolone ointment (KENALOG) 0.1 % APPLY A THIN LAYER TO THE AFFECTED AREA(S) BY TOPICAL ROUTE 2 TIMES PER DAY 11/27/16   [provider]  valsartan (DIOVAN) 160 MG tablet Take 1 tablet by mouth daily. 01/10/16   [provider]    Family History Family History  Problem Relation Age of Onset  . Heart attack Father 21  . Cancer Mother   . Stroke Brother   . Stroke Paternal Grandmother   . Heart attack Maternal Grandfather     Social History Social History   Tobacco Use  . Smoking status: Former Smoker    Packs/day: 0.25    Years: 5.00    Pack years: 1.25    Quit date: 08/17/1981    Years since quitting: 37.7  . Smokeless tobacco: Never Used  Substance Use Topics  . Alcohol use: No  . Drug use: No     Allergies   Penicillins, Tetracyclines & related, Elavil [amitriptyline], Erythromycin, and Macrodantin [nitrofurantoin macrocrystal]   Review of Systems Review of Systems  All other systems reviewed and are negative.    Physical Exam Updated Vital Signs BP (!) 196/100 (BP Location: Right Arm)   Pulse 63   Temp 98.1 F (36.7 C) (Oral)   Resp 14   SpO2 97%   Physical Exam Vitals signs and nursing note reviewed.    84 year old female, resting comfortably and in no acute distress. Vital signs are significant for elevated blood pressure. Oxygen saturation is 97%, which is normal. Head is normocephalic and atraumatic. PERRLA, EOMI. Oropharynx is clear. Neck is nontender and supple without adenopathy or JVD.  There are no carotid bruits. Back is nontender and there is no CVA tenderness. Lungs have scattered expiratory wheezes without rales or  rhonchi. Chest is nontender. Heart has regular rate and rhythm without murmur. Abdomen is soft, flat, nontender without masses or hepatosplenomegaly and peristalsis is normoactive. Extremities have no cyanosis or edema, full range of motion is present. Skin is warm and dry without rash. Neurologic: Mental status is normal, cranial nerves are intact, there are no motor or sensory deficits.  There is no extinction on double simultaneous stimulation.  There is no pronator drift.  Muscle strength is 5/5 and all tested muscle groups of both arms and both legs.  ED Treatments / Results  Labs (all labs ordered are listed, but only abnormal results are displayed) Labs Reviewed  COMPREHENSIVE METABOLIC PANEL - Abnormal; Notable for the following components:  Result Value   Glucose, Bld 126 (*)    Creatinine, Ser 1.17 (*)    GFR calc non Af Amer 43 (*)    GFR calc Af Amer 50 (*)    All other components within normal limits  I-STAT CHEM 8, ED - Abnormal; Notable for the following components:   Creatinine, Ser 1.20 (*)    Glucose, Bld 115 (*)    All other components within normal limits  PROTIME-INR  APTT  CBC  DIFFERENTIAL  CBG MONITORING, ED    EKG EKG Interpretation  Date/Time:  Wednesday April 26 2019 00:14:26 EDT Ventricular Rate:  67 PR Interval:  176 QRS Duration: 92 QT Interval:  404 QTC Calculation: 426 R Axis:   71 Text Interpretation:  Normal sinus rhythm Biatrial enlargement Abnormal ECG When compared with ECG of 10/27/2018, No significant change was found Confirmed by Delora Fuel ([id number]) on 04/26/2019 3:23:08 AM   Radiology Ct Head Wo Contrast  Result Date: 04/26/2019 CLINICAL DATA:  Headache, left arm numbness EXAM: CT HEAD WITHOUT CONTRAST TECHNIQUE: Contiguous axial images were obtained from the base of the skull through the vertex without intravenous contrast. COMPARISON:  10/27/2018 FINDINGS: Brain: No acute intracranial abnormality. Specifically, no  hemorrhage, hydrocephalus, mass lesion, acute infarction, or significant intracranial injury. Vascular: No hyperdense vessel or unexpected calcification. Skull: No acute calvarial abnormality. Sinuses/Orbits: Mucosal thickening throughout the ethmoid air cells and right maxillary sinus. No air-fluid levels. Other: None IMPRESSION: No acute intracranial abnormality. Chronic sinusitis. Electronically Signed   By: Rolm Baptise M.D.   On: 04/26/2019 01:03    Procedures Procedures  Medications Ordered in ED Medications  sodium chloride flush (NS) 0.9 % injection 3 mL (3 mLs Intravenous Not Given 04/26/19 0558)     Initial Impression / Assessment and Plan / ED Course  I have reviewed the triage vital signs and the nursing notes.  Pertinent labs & imaging results that were available during my care of the patient were reviewed by me and considered in my medical decision making (see chart for details).  Paresthesias of the left arm.  No evidence of stroke on exam, but review of past records does show hospital admission for stroke in 2017.  CT showed no acute process, labs show stable renal insufficiency.  She will be sent for MRI scan to rule out subtle stroke.  Case is signed out to Dr. Johnney Killian.  Final Clinical Impressions(s) / ED Diagnoses   Final diagnoses:  Left arm numbness  Elevated blood pressure reading with diagnosis of hypertension  Renal insufficiency    ED Discharge Orders    None       Delora Fuel, MD [id number] [phone]

## 2019-04-26 NOTE — ED Notes (Signed)
Dinner Tray Ordered @ 1714. 

## 2019-04-26 NOTE — ED Triage Notes (Addendum)
Pt c/o left arm numbness that started yesterday morning when she woke up. Reports a headache that started last night as well. A&O x 4, no weakness noted, no sensation differences in extremities. Hx CVA

## 2019-04-26 NOTE — ED Notes (Signed)
Pt returned from CTA, dinner tray provided.

## 2019-04-26 NOTE — Progress Notes (Signed)
  Echocardiogram 2D Echocardiogram has been performed.  Darlina Sicilian M 04/26/2019, 1:27 PM

## 2019-04-26 NOTE — ED Provider Notes (Signed)
MRI neg, D/C.  Physical Exam  BP (!) 186/94   Pulse (!) 58   Temp 98.1 F (36.7 C) (Oral)   Resp 16   SpO2 98%   Physical Exam Patient is alert and appropriate.  She does not show any signs of acute confusion.  She still has some perception of left arm numbness and slight heaviness.  Speech is clear.  Mental status is clear.  No respiratory distress. ED Course/Procedures     Procedures  MDM  MRI results positive for small acute CVA.  Consult: Dr. Malen Gauze consulted for neurology. Consult: Dr. Tamala Julian for admission.       Charlesetta Shanks, MD 04/26/19 1016

## 2019-04-27 ENCOUNTER — Encounter (HOSPITAL_COMMUNITY): Payer: Self-pay

## 2019-04-27 DIAGNOSIS — I63311 Cerebral infarction due to thrombosis of right middle cerebral artery: Secondary | ICD-10-CM

## 2019-04-27 DIAGNOSIS — Z8673 Personal history of transient ischemic attack (TIA), and cerebral infarction without residual deficits: Secondary | ICD-10-CM

## 2019-04-27 LAB — T4, FREE: Free T4: 1 ng/dL (ref 0.61–1.12)

## 2019-04-27 LAB — TSH: TSH: 2.197 u[IU]/mL (ref 0.350–4.500)

## 2019-04-27 LAB — HEMOGLOBIN A1C
Hgb A1c MFr Bld: 5.7 % — ABNORMAL HIGH (ref 4.8–5.6)
Mean Plasma Glucose: 117 mg/dL

## 2019-04-27 MED ORDER — ROSUVASTATIN CALCIUM 40 MG PO TABS: 40.0000 mg | ORAL_TABLET | Freq: Every day | ORAL | 4 refills | Status: DC

## 2019-04-27 MED ORDER — CLOPIDOGREL BISULFATE 75 MG PO TABS: 75.0000 mg | ORAL_TABLET | Freq: Every day | ORAL | 4 refills | Status: DC

## 2019-04-27 MED ORDER — ASPIRIN 81 MG PO TBEC: 81.0000 mg | DELAYED_RELEASE_TABLET | Freq: Every day | ORAL | 0 refills | Status: AC

## 2019-04-27 NOTE — Progress Notes (Signed)
SLP Cancellation Note  Patient Details Name: PETRONILA HARES MRN: [id number] DOB: 10/13/1934   Cancelled treatment:       Reason Eval/Treat Not Completed: Patient at procedure or test/unavailable(Pt being evaluated by PT at this time. SLP will follow up. )  Roran Wegner I. Hardin Negus, [place], [place] Office number [phone] Pager [place] 04/27/2019, 8:57 AM

## 2019-04-27 NOTE — Progress Notes (Signed)
STROKE TEAM PROGRESS NOTE   INTERVAL HISTORY Pt sitting in chair, daughter at bedside. Pt now back to baseline. Admitted that she is not on palvix or statin at home but does not why she was taken off those. MRI showed right small SO infarct.   Vitals:   04/26/19 2212 04/26/19 2329 04/27/19 0341 04/27/19 0822  BP: (!) 174/99 (!) 154/64 (!) 150/68 (!) 168/77  Pulse: 62 69 67 67  Resp: 18 18 18 20   Temp: 97.8 F (36.6 C) 97.8 F (36.6 C) 97.8 F (36.6 C) 97.8 F (36.6 C)  TempSrc: Oral Oral Oral Oral  SpO2: 98% 99% 99% 98%  Weight: 70 kg     Height: 5\' 9"  (1.753 m)       CBC:  Recent Labs  Lab 04/26/19 0016 04/26/19 0115  WBC 5.5  --   NEUTROABS 2.6  --   HGB 14.2 14.3  HCT 43.3 42.0  MCV 89.3  --   PLT 176  --     Basic Metabolic Panel:  Recent Labs  Lab 04/26/19 0016 04/26/19 0115  NA 142 142  K 4.1 3.8  CL 105 104  CO2 26  --   GLUCOSE 126* 115*  BUN 15 16  CREATININE 1.17* 1.20*  CALCIUM 9.6  --    Lipid Panel:     Component Value Date/Time   CHOL 279 (H) 04/26/2019 1200   TRIG 230 (H) 04/26/2019 1200   HDL 43 04/26/2019 1200   CHOLHDL 6.5 04/26/2019 1200   VLDL 46 (H) 04/26/2019 1200   LDLCALC 190 (H) 04/26/2019 1200   HgbA1c:  Lab Results  Component Value Date   HGBA1C 5.7 (H) 04/26/2019   Urine Drug Screen: No results found for: LABOPIA, COCAINSCRNUR, LABBENZ, AMPHETMU, THCU, LABBARB  Alcohol Level No results found for: ETH  IMAGING Ct Angio Head W Or Wo Contrast  Result Date: 04/26/2019 CLINICAL DATA:  Stroke follow-up EXAM: CT ANGIOGRAPHY HEAD AND NECK TECHNIQUE: Multidetector CT imaging of the head and neck was performed using the standard protocol during bolus administration of intravenous contrast. Multiplanar CT image reconstructions and MIPs were obtained to evaluate the vascular anatomy. Carotid stenosis measurements (when applicable) are obtained utilizing NASCET criteria, using the distal internal carotid diameter as the denominator.  CONTRAST:  46mL OMNIPAQUE IOHEXOL 350 MG/ML SOLN COMPARISON:  CT head MRI head 04/26/2019 FINDINGS: CTA NECK FINDINGS Aortic arch: Standard branching. Imaged portion shows no evidence of aneurysm or dissection. No significant stenosis of the major arch vessel origins. Atherosclerotic calcification aortic arch Right carotid system: Minimal atherosclerotic disease right carotid bifurcation without stenosis. Negative for dissection Left carotid system: Mild atherosclerotic disease left carotid bifurcation without stenosis. Negative for dissection Vertebral arteries: Mild calcific stenosis of the origin of both vertebral arteries. Both vertebral arteries with and patent to the basilar without additional stenosis. Skeleton: Cervical spondylosis.  No acute skeletal abnormality. Other neck: Left thyroid nodule 17 mm. 9 mm right thyroid nodule. No adenopathy in the neck. Upper chest: Apically scarring bilaterally.  No acute abnormality. Review of the MIP images confirms the above findings CTA HEAD FINDINGS Anterior circulation: Mild atherosclerotic calcification in the cavernous carotid bilaterally without significant stenosis. Anterior and middle cerebral arteries patent bilaterally without significant stenosis. Posterior circulation: Both vertebral arteries patent to the basilar. PICA patent bilaterally. Basilar widely patent. AICA, superior cerebellar, and posterior cerebral arteries patent bilaterally without stenosis. Fetal origin left posterior cerebral artery. Venous sinuses: Normal venous enhancement Anatomic variants: None Review of the  MIP images confirms the above findings IMPRESSION: 1. No significant carotid  artery stenosis in the neck 2. Mild stenosis of the origin of the vertebral artery bilaterally. 3. Negative for emergent large vessel occlusion. No significant intracranial stenosis 4. 17 mm left thyroid nodule. Recommend follow-up thyroid ultrasound for further evaluation. Electronically Signed   By:  Franchot Gallo M.D.   On: 04/26/2019 17:33   Ct Head Wo Contrast  Result Date: 04/26/2019 CLINICAL DATA:  Headache, left arm numbness EXAM: CT HEAD WITHOUT CONTRAST TECHNIQUE: Contiguous axial images were obtained from the base of the skull through the vertex without intravenous contrast. COMPARISON:  10/27/2018 FINDINGS: Brain: No acute intracranial abnormality. Specifically, no hemorrhage, hydrocephalus, mass lesion, acute infarction, or significant intracranial injury. Vascular: No hyperdense vessel or unexpected calcification. Skull: No acute calvarial abnormality. Sinuses/Orbits: Mucosal thickening throughout the ethmoid air cells and right maxillary sinus. No air-fluid levels. Other: None IMPRESSION: No acute intracranial abnormality. Chronic sinusitis. Electronically Signed   By: Rolm Baptise M.D.   On: 04/26/2019 01:03   Ct Angio Neck W Or Wo Contrast  Result Date: 04/26/2019 CLINICAL DATA:  Stroke follow-up EXAM: CT ANGIOGRAPHY HEAD AND NECK TECHNIQUE: Multidetector CT imaging of the head and neck was performed using the standard protocol during bolus administration of intravenous contrast. Multiplanar CT image reconstructions and MIPs were obtained to evaluate the vascular anatomy. Carotid stenosis measurements (when applicable) are obtained utilizing NASCET criteria, using the distal internal carotid diameter as the denominator. CONTRAST:  26mL OMNIPAQUE IOHEXOL 350 MG/ML SOLN COMPARISON:  CT head MRI head 04/26/2019 FINDINGS: CTA NECK FINDINGS Aortic arch: Standard branching. Imaged portion shows no evidence of aneurysm or dissection. No significant stenosis of the major arch vessel origins. Atherosclerotic calcification aortic arch Right carotid system: Minimal atherosclerotic disease right carotid bifurcation without stenosis. Negative for dissection Left carotid system: Mild atherosclerotic disease left carotid bifurcation without stenosis. Negative for dissection Vertebral arteries: Mild  calcific stenosis of the origin of both vertebral arteries. Both vertebral arteries with and patent to the basilar without additional stenosis. Skeleton: Cervical spondylosis.  No acute skeletal abnormality. Other neck: Left thyroid nodule 17 mm. 9 mm right thyroid nodule. No adenopathy in the neck. Upper chest: Apically scarring bilaterally.  No acute abnormality. Review of the MIP images confirms the above findings CTA HEAD FINDINGS Anterior circulation: Mild atherosclerotic calcification in the cavernous carotid bilaterally without significant stenosis. Anterior and middle cerebral arteries patent bilaterally without significant stenosis. Posterior circulation: Both vertebral arteries patent to the basilar. PICA patent bilaterally. Basilar widely patent. AICA, superior cerebellar, and posterior cerebral arteries patent bilaterally without stenosis. Fetal origin left posterior cerebral artery. Venous sinuses: Normal venous enhancement Anatomic variants: None Review of the MIP images confirms the above findings IMPRESSION: 1. No significant carotid  artery stenosis in the neck 2. Mild stenosis of the origin of the vertebral artery bilaterally. 3. Negative for emergent large vessel occlusion. No significant intracranial stenosis 4. 17 mm left thyroid nodule. Recommend follow-up thyroid ultrasound for further evaluation. Electronically Signed   By: Franchot Gallo M.D.   On: 04/26/2019 17:33   Mr Brain Wo Contrast  Result Date: 04/26/2019 CLINICAL DATA:  84 year old female with left upper extremity heaviness, difficulty holding things in her hand. Headache on presentation, now resolved. EXAM: MRI HEAD WITHOUT CONTRAST TECHNIQUE: Multiplanar, multiecho pulse sequences of the brain and surrounding structures were obtained without intravenous contrast. COMPARISON:  Head CT earlier today.  Brain MRI 10/02/2015. FINDINGS: Brain: Small linear 7 millimeter focus  of restricted diffusion in the posterior right centrum  semiovale near the motor strip (series 2, image 35). Faint associated T2 and FLAIR hyperintensity. No associated hemorrhage or mass effect. No other restricted diffusion. No midline shift, mass effect, evidence of mass lesion, ventriculomegaly, extra-axial collection or acute intracranial hemorrhage. Cervicomedullary junction and pituitary are within normal limits. Multiple chronic lacunar infarcts in both thalami appear similar to the 2017 exam. Scattered small chronic infarcts in both cerebellar hemispheres are stable. Superimposed periventricular bilateral cerebral white matter T2 and FLAIR hyperintensity which also resembles small chronic infarcts on series 6, image 17. No cortical encephalomalacia. No definite chronic blood products. The basal ganglia and brainstem remain normal for age. Vascular: Major intracranial vascular flow voids are stable since 2017. Skull and upper cervical spine: Mild for age. Normal bone marrow disc degeneration in the visible cervical spine signal. Sinuses/Orbits: Stable orbits. Increased mild to moderate paranasal sinus mucosal thickening. Other: Mastoids remain clear.  Negative scalp and face soft tissues. IMPRESSION: 1. Small acute lacunar infarct in the posterior right centrum semiovale near the motor strip. No associated hemorrhage or mass effect. 2. Moderately advanced chronic small vessel ischemia, that the thalami and cerebellum is stable since 2017. Electronically Signed   By: Genevie Ann M.D.   On: 04/26/2019 08:19    PHYSICAL EXAM  Temp:  [97.8 F (36.6 C)-97.9 F (36.6 C)] 97.9 F (36.6 C) (09/10 1351) Pulse Rate:  [62-70] 70 (09/10 1351) Resp:  [16-20] 16 (09/10 1351) BP: (150-174)/(64-99) 169/68 (09/10 1351) SpO2:  [98 %-99 %] 99 % (09/10 1351) Weight:  [70 kg] 70 kg (09/09 2212)  General - Well nourished, well developed, in no apparent distress.  Ophthalmologic - fundi not visualized due to noncooperation.  Cardiovascular - Regular rate and  rhythm.  Mental Status -  Level of arousal and orientation to time, place, and person were intact. Language including expression, naming, repetition, comprehension was assessed and found intact.  Cranial Nerves II - XII - II - Visual field intact OU. III, IV, VI - Extraocular movements intact. V - Facial sensation intact bilaterally. VII - Facial movement intact bilaterally. VIII - Hearing & vestibular intact bilaterally. X - Palate elevates symmetrically. XI - Chin turning & shoulder shrug intact bilaterally. XII - Tongue protrusion intact.  Motor Strength - The patient's strength was normal in all extremities and pronator drift was absent.  Bulk was normal and fasciculations were absent.   Motor Tone - Muscle tone was assessed at the neck and appendages and was normal.  Reflexes - The patient's reflexes were symmetrical in all extremities and she had no pathological reflexes.  Sensory - Light touch, temperature/pinprick were assessed and were symmetrical.    Coordination - The patient had normal movements in the hands with no ataxia or dysmetria.  Tremor was absent.  Gait and Station - deferred.   ASSESSMENT/PLAN Crystal Day is a 84 y.o. female with history of  prior thalamic and remote bilateral thalamic and cerebellar lacunes with no residual deficits, severe right P1 stenosis,  pneumonia, non-STEMI, CAD, hypertension, hyperlipidemia, basal cell carcinoma and anginal pain presenting with L arm weakness and decreased sensation since Tues (2 days).   Stroke:   R centrum semiovale infarct secondary to small vessel disease source  CT head No acute abnormality. Chronic sinusitis.   MRI  Small posterior R centrum semiovale infarct. Moderately advanced small vessel disease.   CTA head & neck no LVO. No significant stenosis. 3mm L thyroid nodule  2D Echo EF 60-65%. No source of embolus   LDL 190  HgbA1c 5.7  Lovenox 40 mg sq daily for VTE prophylaxis  No  antithrombotic prior to admission, now on aspirin 81 mg daily and clopidogrel 75 mg daily. Continue DAPT x 3 weeks then ASPIRIN alone.    Therapy recommendations:  No therapy needs  Disposition:  Return home  [place] for d/c from stroke standpoint. Follow up [place] stroke clinic in 4 weeks. Order placed.  Hx stroke/TIA  02/2016 - L medial anterior thalamic infarct, old B thalamic and cerebellar lacunes. TTE and CUS unremarkable. MRA right P1 stenosis. LDL 183 and A1C 5.9. Put on plavix and pravastatin.  Pt stopped both since that time, but not know why  Hypertension  Stable . Permissive hypertension (OK if < 220/120) but gradually normalize in 5-7 days . Long-term BP goal normotensive  Hyperlipidemia  Home meds:  No statin (stopped following 02/2016 d/c)  Now on lipitor 80  LDL 190, goal < 70  Continue statin at discharge  Other Stroke Risk Factors  Advanced age  Former Cigarette smoker, quit 79 yrs ago  Family hx stroke (brother, Paternal Grandmother)   Coronary artery disease s/p NSTEMI  Other Active Problems  CKD (chronic kidney disease) stage 3, GFR 30-59 ml/min ([place]). For OP f/u PCP, nephrology  Thyroid nodule, incidental finding on CTA. F/u PCP w/ labs, [place]  Hospital day # 1  Neurology will sign off. Please call with questions. Pt will follow up with stroke clinic NP at [facility] in about 4 weeks. Thanks for the consult.  Rosalin Hawking, MD PhD Stroke Neurology 04/27/2019 7:37 PM    To contact Stroke Continuity provider, please refer to [email or web address] After hours, contact General Neurology

## 2019-04-27 NOTE — Plan of Care (Signed)
Patient has no neuro deficit at this time. Plan for possible discharge.

## 2019-04-27 NOTE — Evaluation (Signed)
Speech Language Pathology Evaluation Patient Details Name: Crystal Day MRN: [id number] DOB: 1935/07/22 Today's Date: 04/27/2019 Time: [id number] SLP Time Calculation (min) (ACUTE ONLY): 17 min  Problem List:  Patient Active Problem List   Diagnosis Date Noted  . CVA (cerebral vascular accident) ([place]) 04/26/2019  . Non-STEMI (non-ST elevated myocardial infarction) ([place])   . Non-obstructive CAD   . Hypertension   . TIA (transient ischemic attack)   . Lacunar infarct, acute ([place]) 10/03/2015  . CKD (chronic kidney disease) stage 3, GFR 30-59 ml/min ([facility]) 10/03/2015  . Arterial ischemic stroke, vertebrobasilar, thalamic, acute, left ([place])   . HLD (hyperlipidemia)   . OA (osteoarthritis) of knee 12/31/2014  . Syncope 09/28/2014  . Hyperlipidemia 04/17/2014  . Atherosclerosis of native coronary artery of native heart without angina pectoris 10/05/2013  . NSTEMI (non-ST elevated myocardial infarction) ([place]) 09/29/2013  . Chest pain 09/28/2013  . Essential hypertension 09/28/2013  . Acute renal failure ([place]) 09/28/2013   Past Medical History:  Past Medical History:  Diagnosis Date  . Anginal pain ([place]) since 2014  . Arthritis    just in the hands  . Cancer ([place]) 1988 or 89   basal cell carcinoma on face s/p Mohs procedure  . Complication of anesthesia    nausea  . History of wheezing    in spring and fall  . Hyperlipidemia   . Hypertension   . Non-obstructive CAD    a. 09/2013 Cath/NSTEMI: nonobs dzs.  . Non-STEMI (non-ST elevated myocardial infarction) ([place])    a. 09/2013 - peak trop 5.38;  b. 09/2013 Cath: nonobs dzs-->Med Rx.  . Pneumonia    "in my 20's"  . PONV (postoperative nausea and vomiting)   . Stroke [facility]) 2009   a. 09/2015 L Thalamic Lacunar infarct, presumed to be 2/2 small vessel dzs, residuals include numbness on right side of lips and right finger tips; b. 09/2015 Echo: nl LV fxn; c. 09/2015 Carotid U/S: mild bilat ICA stenosis.   Past Surgical History:  Past  Surgical History:  Procedure Laterality Date  . ABDOMINAL HYSTERECTOMY  1974  . BREAST SURGERY Left 1976   lumpectomy  . EYE SURGERY     cataract surgery, both eyes  . FRACTURE SURGERY Right 1971   thumb and index finger  . LEFT HEART CATHETERIZATION WITH CORONARY ANGIOGRAM N/A 09/29/2013   Procedure: LEFT HEART CATHETERIZATION WITH CORONARY ANGIOGRAM;  Surgeon: Peter M Martinique, MD;  Location: [facility] CATH LAB;  Service: Cardiovascular;  Laterality: N/A;  . PARTIAL KNEE ARTHROPLASTY Right 12/31/2014   Procedure: RIGHT KNEE MEDIAL UNICOMPARTMENTAL ARTHROPLASTY;  Surgeon: Gaynelle Arabian, MD;  Location: [facility] ORS;  Service: Orthopedics;  Laterality: Right;   HPI:  Pt is an 84 y.o. female with medical history significant of HTN, CAD, CVA/TIA, and remote tobacco use who presented with complaints of left arm numbness and weakness. TPA was not administered. MRI of the brain showed small acute lacunar infarct in the posterior right centrum semiovale near the motor strip.    Assessment / Plan / Recommendation Clinical Impression  Pt reported that she was living independently prior to admission. She denied any acute changes in speech, language or cognition. She stated that she has had memory difficulty since a prior CVA but has been compensating for this with use of external memory aids. Her speech and language skills are currently within normal limits and cognitive-linguistic skills were within functional limits but the reported difficulty with memory was observed. Further skilled SLP services are not clinically indicated  at this time. Pt and nursing were educated regarding results and recommendations; both parties verbalized understanding as well as agreement with plan of care.    SLP Assessment  SLP Recommendation/Assessment: Patient does not need any further Speech Lanaguage Pathology Services SLP Visit Diagnosis: Cognitive communication deficit (R41.841)    Follow Up Recommendations  None    Frequency and  Duration           SLP Evaluation Cognition  Overall Cognitive Status: Within Functional Limits for tasks assessed Arousal/Alertness: Awake/alert Orientation Level: Oriented X4 Attention: Focused;Sustained Focused Attention: Appears intact Sustained Attention: Appears intact Memory: Impaired Memory Impairment: Decreased recall of new information;Storage deficit(Immediate: 3/3; delayed: 0/3; with cues: 3/3) Awareness: Appears intact Problem Solving: Appears intact(5/5) Executive Function: Sequencing Sequencing: Appears intact       Comprehension  Auditory Comprehension Overall Auditory Comprehension: Appears within functional limits for tasks assessed Yes/No Questions: Within Functional Limits Basic Biographical Questions: (5/5) Complex Questions: (5/5) Paragraph Comprehension (via yes/no questions): (2/4) Commands: Within Functional Limits Two Step Basic Commands: (4/4) Multistep Basic Commands: (4/4) Conversation: Complex Visual Recognition/Discrimination Discrimination: Within Function Limits Reading Comprehension Reading Status: Within funtional limits    Expression Expression Primary Mode of Expression: Verbal Verbal Expression Overall Verbal Expression: Appears within functional limits for tasks assessed Initiation: No impairment Automatic Speech: Counting;Day of week;Month of year(WNL) Level of Generative/Spontaneous Verbalization: Conversation Repetition: No impairment(5/5) Naming: No impairment Responsive: (5/5) Confrontation: Within functional limits(10/10) Convergent: (Sentence completion: 5/5) Pragmatics: No impairment Written Expression Dominant Hand: Right   Oral / Motor  Oral Motor/Sensory Function Overall Oral Motor/Sensory Function: Within functional limits Motor Speech Overall Motor Speech: Appears within functional limits for tasks assessed Respiration: Within functional limits Phonation: Normal Resonance: Within functional  limits Articulation: Within functional limitis Intelligibility: Intelligible Motor Planning: Witnin functional limits Motor Speech Errors: Not applicable   Beronica [place] I. Hardin Negus, [place], [place] Office number [phone] Pager [place] 04/27/2019, 10:05 AM

## 2019-04-27 NOTE — Evaluation (Signed)
Occupational Therapy Evaluation Patient Details Name: Crystal Day MRN: [id number] DOB: 11/06/1934 Today's Date: 04/27/2019    History of Present Illness 84 y.o. right-handed female with medical history significant of HTN, CAD, CVA/TIA, and remote tobacco use; who presented 04/26/19 with complaints of left arm numbness and weakness starting yesterday morning when she woke up. BP 220/110. At baseline patient complains of some mild shortness of breath with exertion, but states that this is unchanged. Negative COVID screen   Clinical Impression   Pt PTA: living alone, independently with ADL and mobility. Pt performing LB dressing w/ figure 4 technique and standing at sink for ADL. Pt performing mobility and transfers in room with modified independence. Pt's LUE hand with decreased [place] at this time, strength is WFLs and sensation is intact. Pt given HEP with [place] and tasks to [place]. Pt with family member present in room and aware of importance of using LUE for all functional tasks. Pt does not require continued OT skilled services. OT signing off.    Follow Up Recommendations  No OT follow up    Equipment Recommendations  None recommended by OT    Recommendations for Other Services       Precautions / Restrictions Precautions Precautions: None Restrictions Weight Bearing Restrictions: No      Mobility Bed Mobility Overal bed mobility: Independent                Transfers Overall transfer level: Independent Equipment used: None             General transfer comment: x 3 no issues    Balance Overall balance assessment: Modified Independent                               Standardized Balance Assessment Standardized Balance Assessment : Dynamic Gait Index;Berg Balance Test Berg Balance Test Sit to Stand: Able to stand without using hands and stabilize independently Standing Unsupported: Able to stand safely 2 minutes Sitting with Back Unsupported  but Feet Supported on Floor or Stool: Able to sit safely and securely 2 minutes Stand to Sit: Sits safely with minimal use of hands Transfers: Able to transfer safely, minor use of hands Standing Unsupported with Eyes Closed: Able to stand 10 seconds safely Standing Ubsupported with Feet Together: Able to place feet together independently and stand 1 minute safely From Standing, Reach Forward with Outstretched Arm: Can reach confidently >25 cm (10") From Standing Position, Pick up Object from Floor: Able to pick up shoe safely and easily From Standing Position, Turn to Look Behind Over each Shoulder: Looks behind from both sides and weight shifts well Turn 360 Degrees: Able to turn 360 degrees safely but slowly Standing Unsupported, Alternately Place Feet on Step/Stool: Able to complete 4 steps without aid or supervision Standing Unsupported, One Foot in Front: Able to plae foot ahead of the other independently and hold 30 seconds Standing on One Leg: Tries to lift leg/unable to hold 3 seconds but remains standing independently Total Score: 48 Dynamic Gait Index Level Surface: Normal Change in Gait Speed: Normal Gait with Horizontal Head Turns: Normal Gait with Vertical Head Turns: Normal Gait and Pivot Turn: Normal Step Over Obstacle: Mild Impairment Step Around Obstacles: Normal Steps: Mild Impairment Total Score: 22     ADL either performed or assessed with clinical judgement   ADL Overall ADL's : At baseline  General ADL Comments: Pt performing LB dressing w/ figure 4 technique and standing at sink for ADL.     Vision Baseline Vision/History: No visual deficits Vision Assessment?: No apparent visual deficits     Perception     Praxis      Pertinent Vitals/Pain Pain Assessment: No/denies pain     Hand Dominance Right   Extremity/Trunk Assessment Upper Extremity Assessment Upper Extremity Assessment: LUE  deficits/detail;Generalized weakness LUE Deficits / Details: decreased [place] LUE Sensation: WNL LUE Coordination: decreased fine motor   Lower Extremity Assessment Lower Extremity Assessment: Overall WFL for tasks assessed   Cervical / Trunk Assessment Cervical / Trunk Assessment: Normal   Communication Communication Communication: No difficulties   Cognition Arousal/Alertness: Awake/alert Behavior During Therapy: WFL for tasks assessed/performed Overall Cognitive Status: Within Functional Limits for tasks assessed                                     General Comments  Supportive family to assist as needed    Exercises Exercises: Other exercises Other Exercises Other Exercises: [facility] exercises   Shoulder Instructions      Home Living Family/patient expects to be discharged to:: Private residence Living Arrangements: Alone Available Help at Discharge: Family;Available PRN/intermittently Type of Home: House Home Access: Stairs to enter [employer] of Steps: 4 Entrance Stairs-Rails: None Home Layout: One level     Bathroom Shower/Tub: Corporate investment banker: Handicapped height     Home Equipment: Radio producer - single point      Lives With: Alone    Prior Functioning/Environment Level of Independence: Independent        Comments: loves to work in her large yard; has not given up ladders but son has asked her to        OT Problem List: Impaired UE functional use;Decreased strength      OT Treatment/Interventions:      OT Goals(Current goals can be found in the care plan section) Acute Rehab OT Goals Patient Stated Goal: get back to being as active in her yard as before OT Goal Formulation: With patient Time For Goal Achievement: 05/11/19 Potential to Achieve Goals: Good  OT Frequency:     Barriers to D/C:            Co-evaluation              AM-PAC OT "6 Clicks" Daily Activity     Outcome Measure Help  from another person eating meals?: None Help from another person taking care of personal grooming?: None Help from another person toileting, which includes using toliet, bedpan, or urinal?: None Help from another person bathing (including washing, rinsing, drying)?: None Help from another person to put on and taking off regular upper body clothing?: None Help from another person to put on and taking off regular lower body clothing?: None 6 Click Score: 24   End of Session Nurse Communication: Mobility status  Activity Tolerance: Patient tolerated treatment well Patient left: in chair;with call bell/phone within reach;with family/visitor present  OT Visit Diagnosis: Muscle weakness (generalized) (M62.81)                Time: 1109-1130 OT Time Calculation (min): 21 min Charges:  OT General Charges $OT Visit: 1 Visit OT Evaluation $OT Eval Moderate Complexity: 1 Mod  Darryl Nestle) Marsa Aris OTR/L Acute Rehabilitation Services Pager: [phone] Office: [phone]   Audie Pinto 04/27/2019,  11:39 AM

## 2019-04-27 NOTE — Progress Notes (Signed)
Patient given discharge instructions and teaching. No new questions or concerns. IV and tele removed.   Daughter to transport home

## 2019-04-27 NOTE — Progress Notes (Signed)
[facility]                                                                              Patient Demographics  Crystal Day, is a 84 y.o. female, DOB - 04-12-1935, [id number]  Admit date - 04/26/2019   Admitting Physician Norval Morton, MD  Outpatient Primary MD for the patient is Carol Ada, MD  Outpatient specialists:   LOS - 1  days   Medical records reviewed and are as summarized below:    Chief Complaint  Patient presents with   Numbness       Brief summary   Patient is 84 year old female with hypertension, CAD, prior CVA, remote tobacco use presented with left arm numbness, weakness that started a day before the admission when she woke up.  Patient reported left arm felt heavy and numb was unable to pick up her morning coffee.  She felt her left arm was falling when she tried to reach for things.  Also complained of headache, weakness above lower extremities, mild shortness of breath with exertion which is chronic.  CT head showed no acute abnormalities, MRI of the brain showed acute posterior right centrum ovale infarct with advanced chronic small vessel disease. COVID-19 test negative   Assessment & Plan    Principal Problem: Acute CVA (cerebral vascular accident) ([place]) -Per patient left arm weakness is now improving, was able to lift and hold her cup this morning during exam -MRI confirmed small acute lacunar infarct in the posterior right centrum ovale near the motor strip -CT angiogram head and neck showed no significant carotid artery stenosis in the neck, mild stenosis of the origin of the vertebral artery bilaterally negative for emergent large vessel occlusion. -2D echo showed EF of 60 to 65%, no PFO -Patient had taken herself off the cholesterol meds, LDL significantly elevated at 190, cholesterol 279, triglycerides 230, placed on statin -A1c 5.7 -Patient was placed on aspirin 325 mg daily-> stroke service recommending aspirin 81  mg daily with Plavix -PT evaluation recommended no PT follow-up, back to baseline -Discussed with neurology, awaiting stroke service recommendation regarding antiplatelet agent, possible DC home today   Active Problems:   Essential hypertension -Permissive hypertension due to acute CVA, restart antihypertensives at home    Hyperlipidemia - -Patient had taken herself off statin, LDL significantly elevated at 190, cholesterol 279, triglycerides 230, placed on statin  Thyroid nodule, incidental finding on CTA -CT angiogram of the head and neck showed 17 mm left thyroid nodule, explained to the patient in detail.  She will need follow-up thyroid ultrasound for further evaluation by her PCP -Follow TSH, free T4    CKD (chronic kidney disease) stage 3, GFR 30-59 ml/min (HCC) -Baseline creatinine 1.1-1.3, currently at baseline -Follow outpatient with PCP, referral to nephrology  Code Status: Full code DVT Prophylaxis:  Lovenox  Family Communication: Discussed all imaging results, lab results, explained to the patient   Disposition Plan: Possible DC home today, pending stroke service, Occupational Therapy clearance   Time Spent in minutes 35 minutes  Procedures:  MRI brain, CT angiogram head and neck 2D echo  Consultants:   Neurology  Antimicrobials:   Anti-infectives (From admission, onward)   None          Medications  Scheduled Meds:  aspirin EC  81 mg Oral Daily   atorvastatin  80 mg Oral q1800   clopidogrel  75 mg Oral Daily   enoxaparin (LOVENOX) injection  40 mg Subcutaneous Q24H   sodium chloride flush  3 mL Intravenous Once   Continuous Infusions: PRN Meds:.acetaminophen **OR** acetaminophen (TYLENOL) oral liquid 160 mg/5 mL **OR** acetaminophen      Subjective:   Crystal Day was seen and examined today.  Per patient, left arm weakness improving from yesterday, able to hold coffee cup.  No headache. atient denies dizziness, chest pain, shortness  of breath, abdominal pain, N/V/D/C. No acute events overnight.    Objective:   Vitals:   04/26/19 2212 04/26/19 2329 04/27/19 0341 04/27/19 0822  BP: (!) 174/99 (!) 154/64 (!) 150/68 (!) 168/77  Pulse: 62 69 67 67  Resp: 18 18 18 20   Temp: 97.8 F (36.6 C) 97.8 F (36.6 C) 97.8 F (36.6 C) 97.8 F (36.6 C)  TempSrc: Oral Oral Oral Oral  SpO2: 98% 99% 99% 98%  Weight: 70 kg     Height: 5\' 9"  (1.753 m)      No intake or output data in the 24 hours ending 04/27/19 0950   Wt Readings from Last 3 Encounters:  04/26/19 70 kg  12/21/17 71.1 kg  12/18/16 71.8 kg     Exam  General: Alert and oriented x 3, NAD  Eyes:   HEENT:    Cardiovascular: S1 S2 auscultated, no murmurs, RRR  Respiratory: Clear to auscultation bilaterally, no wheezing, rales or rhonchi  Gastrointestinal: Soft, nontender, nondistended, + bowel sounds  Ext: no pedal edema bilaterally  Neuro: AAOx3, Cr N's II- XII. Strength 5/5 RUE, RLE, LUE 4/5. LLE 5/5  Musculoskeletal: No digital cyanosis, clubbing  Skin: No rashes  Psych: Normal affect and demeanor, alert and oriented x3    Data Reviewed:  I have personally reviewed following labs and imaging studies  Micro Results Recent Results (from the past 240 hour(s))  SARS Coronavirus 2 [facility] order, Performed in [facility] hospital lab) Nasopharyngeal Nasopharyngeal Swab     Status: None   Collection Time: 04/26/19  9:42 AM   Specimen: Nasopharyngeal Swab  Result Value Ref Range Status   SARS Coronavirus 2 NEGATIVE NEGATIVE Final    Comment: (NOTE) If result is NEGATIVE SARS-CoV-2 target nucleic acids are NOT DETECTED. The SARS-CoV-2 RNA is generally detectable in upper and lower  respiratory specimens during the acute phase of infection. The lowest  concentration of SARS-CoV-2 viral copies this assay can detect is 250  copies / mL. A negative result does not preclude SARS-CoV-2 infection  and should not be used as the sole basis for  treatment or other  patient management decisions.  A negative result may occur with  improper specimen collection / handling, submission of specimen other  than nasopharyngeal swab, presence of viral mutation(s) within the  areas targeted by this assay, and inadequate number of viral copies  (<250 copies / mL). A negative result must be combined with clinical  observations, patient history, and epidemiological information. If result is POSITIVE SARS-CoV-2 target nucleic acids are DETECTED. The SARS-CoV-2 RNA is generally detectable in upper and lower  respiratory specimens dur ing the acute phase of infection.  Positive  results are indicative of active infection with SARS-CoV-2.  Clinical  correlation with patient history and other  diagnostic information is  necessary to determine patient infection status.  Positive results do  not rule out bacterial infection or co-infection with other viruses. If result is PRESUMPTIVE POSTIVE SARS-CoV-2 nucleic acids MAY BE PRESENT.   A presumptive positive result was obtained on the submitted specimen  and confirmed on repeat testing.  While 2019 novel coronavirus  (SARS-CoV-2) nucleic acids may be present in the submitted sample  additional confirmatory testing may be necessary for epidemiological  and / or clinical management purposes  to differentiate between  SARS-CoV-2 and other Sarbecovirus currently known to infect humans.  If clinically indicated additional testing with an alternate test  methodology [phone]) is advised. The SARS-CoV-2 RNA is generally  detectable in upper and lower respiratory sp ecimens during the acute  phase of infection. The expected result is Negative. Fact Sheet for Patients:  StrictlyIdeas.no Fact Sheet for Healthcare Providers: BankingDealers.co.za This test is not yet approved or cleared by the [place] FDA and has been authorized for detection and/or  diagnosis of SARS-CoV-2 by FDA under an Emergency Use Authorization (EUA).  This EUA will remain in effect (meaning this test can be used) for the duration of the COVID-19 declaration under Section 564(b)(1) of the Act, 21 U.S.C. section 360bbb-3(b)(1), unless the authorization is terminated or revoked sooner. Performed at [facility], [place] [address] [place], [place] [address]     Radiology Reports Ct Angio Head W Or Wo Contrast  Result Date: 04/26/2019 CLINICAL DATA:  Stroke follow-up EXAM: CT ANGIOGRAPHY HEAD AND NECK TECHNIQUE: Multidetector CT imaging of the head and neck was performed using the standard protocol during bolus administration of intravenous contrast. Multiplanar CT image reconstructions and MIPs were obtained to evaluate the vascular anatomy. Carotid stenosis measurements (when applicable) are obtained utilizing NASCET criteria, using the distal internal carotid diameter as the denominator. CONTRAST:  20mL OMNIPAQUE IOHEXOL 350 MG/ML SOLN COMPARISON:  CT head MRI head 04/26/2019 FINDINGS: CTA NECK FINDINGS Aortic arch: Standard branching. Imaged portion shows no evidence of aneurysm or dissection. No significant stenosis of the major arch vessel origins. Atherosclerotic calcification aortic arch Right carotid system: Minimal atherosclerotic disease right carotid bifurcation without stenosis. Negative for dissection Left carotid system: Mild atherosclerotic disease left carotid bifurcation without stenosis. Negative for dissection Vertebral arteries: Mild calcific stenosis of the origin of both vertebral arteries. Both vertebral arteries with and patent to the basilar without additional stenosis. Skeleton: Cervical spondylosis.  No acute skeletal abnormality. Other neck: Left thyroid nodule 17 mm. 9 mm right thyroid nodule. No adenopathy in the neck. Upper chest: Apically scarring bilaterally.  No acute abnormality. Review of the MIP images confirms the above findings CTA HEAD  FINDINGS Anterior circulation: Mild atherosclerotic calcification in the cavernous carotid bilaterally without significant stenosis. Anterior and middle cerebral arteries patent bilaterally without significant stenosis. Posterior circulation: Both vertebral arteries patent to the basilar. PICA patent bilaterally. Basilar widely patent. AICA, superior cerebellar, and posterior cerebral arteries patent bilaterally without stenosis. Fetal origin left posterior cerebral artery. Venous sinuses: Normal venous enhancement Anatomic variants: None Review of the MIP images confirms the above findings IMPRESSION: 1. No significant carotid  artery stenosis in the neck 2. Mild stenosis of the origin of the vertebral artery bilaterally. 3. Negative for emergent large vessel occlusion. No significant intracranial stenosis 4. 17 mm left thyroid nodule. Recommend follow-up thyroid ultrasound for further evaluation. Electronically Signed   By: Franchot Gallo M.D.   On: 04/26/2019 17:33   Ct Head Wo Contrast  Result Date: 04/26/2019 CLINICAL DATA:  Headache, left arm numbness EXAM: CT HEAD WITHOUT CONTRAST TECHNIQUE: Contiguous axial images were obtained from the base of the skull through the vertex without intravenous contrast. COMPARISON:  10/27/2018 FINDINGS: Brain: No acute intracranial abnormality. Specifically, no hemorrhage, hydrocephalus, mass lesion, acute infarction, or significant intracranial injury. Vascular: No hyperdense vessel or unexpected calcification. Skull: No acute calvarial abnormality. Sinuses/Orbits: Mucosal thickening throughout the ethmoid air cells and right maxillary sinus. No air-fluid levels. Other: None IMPRESSION: No acute intracranial abnormality. Chronic sinusitis. Electronically Signed   By: Rolm Baptise M.D.   On: 04/26/2019 01:03   Ct Angio Neck W Or Wo Contrast  Result Date: 04/26/2019 CLINICAL DATA:  Stroke follow-up EXAM: CT ANGIOGRAPHY HEAD AND NECK TECHNIQUE: Multidetector CT imaging of  the head and neck was performed using the standard protocol during bolus administration of intravenous contrast. Multiplanar CT image reconstructions and MIPs were obtained to evaluate the vascular anatomy. Carotid stenosis measurements (when applicable) are obtained utilizing NASCET criteria, using the distal internal carotid diameter as the denominator. CONTRAST:  80mL OMNIPAQUE IOHEXOL 350 MG/ML SOLN COMPARISON:  CT head MRI head 04/26/2019 FINDINGS: CTA NECK FINDINGS Aortic arch: Standard branching. Imaged portion shows no evidence of aneurysm or dissection. No significant stenosis of the major arch vessel origins. Atherosclerotic calcification aortic arch Right carotid system: Minimal atherosclerotic disease right carotid bifurcation without stenosis. Negative for dissection Left carotid system: Mild atherosclerotic disease left carotid bifurcation without stenosis. Negative for dissection Vertebral arteries: Mild calcific stenosis of the origin of both vertebral arteries. Both vertebral arteries with and patent to the basilar without additional stenosis. Skeleton: Cervical spondylosis.  No acute skeletal abnormality. Other neck: Left thyroid nodule 17 mm. 9 mm right thyroid nodule. No adenopathy in the neck. Upper chest: Apically scarring bilaterally.  No acute abnormality. Review of the MIP images confirms the above findings CTA HEAD FINDINGS Anterior circulation: Mild atherosclerotic calcification in the cavernous carotid bilaterally without significant stenosis. Anterior and middle cerebral arteries patent bilaterally without significant stenosis. Posterior circulation: Both vertebral arteries patent to the basilar. PICA patent bilaterally. Basilar widely patent. AICA, superior cerebellar, and posterior cerebral arteries patent bilaterally without stenosis. Fetal origin left posterior cerebral artery. Venous sinuses: Normal venous enhancement Anatomic variants: None Review of the MIP images confirms the  above findings IMPRESSION: 1. No significant carotid  artery stenosis in the neck 2. Mild stenosis of the origin of the vertebral artery bilaterally. 3. Negative for emergent large vessel occlusion. No significant intracranial stenosis 4. 17 mm left thyroid nodule. Recommend follow-up thyroid ultrasound for further evaluation. Electronically Signed   By: Franchot Gallo M.D.   On: 04/26/2019 17:33   Mr Brain Wo Contrast  Result Date: 04/26/2019 CLINICAL DATA:  84 year old female with left upper extremity heaviness, difficulty holding things in her hand. Headache on presentation, now resolved. EXAM: MRI HEAD WITHOUT CONTRAST TECHNIQUE: Multiplanar, multiecho pulse sequences of the brain and surrounding structures were obtained without intravenous contrast. COMPARISON:  Head CT earlier today.  Brain MRI 10/02/2015. FINDINGS: Brain: Small linear 7 millimeter focus of restricted diffusion in the posterior right centrum semiovale near the motor strip (series 2, image 35). Faint associated T2 and FLAIR hyperintensity. No associated hemorrhage or mass effect. No other restricted diffusion. No midline shift, mass effect, evidence of mass lesion, ventriculomegaly, extra-axial collection or acute intracranial hemorrhage. Cervicomedullary junction and pituitary are within normal limits. Multiple chronic lacunar infarcts in both thalami appear similar to the 2017 exam. Scattered small chronic  infarcts in both cerebellar hemispheres are stable. Superimposed periventricular bilateral cerebral white matter T2 and FLAIR hyperintensity which also resembles small chronic infarcts on series 6, image 17. No cortical encephalomalacia. No definite chronic blood products. The basal ganglia and brainstem remain normal for age. Vascular: Major intracranial vascular flow voids are stable since 2017. Skull and upper cervical spine: Mild for age. Normal bone marrow disc degeneration in the visible cervical spine signal. Sinuses/Orbits:  Stable orbits. Increased mild to moderate paranasal sinus mucosal thickening. Other: Mastoids remain clear.  Negative scalp and face soft tissues. IMPRESSION: 1. Small acute lacunar infarct in the posterior right centrum semiovale near the motor strip. No associated hemorrhage or mass effect. 2. Moderately advanced chronic small vessel ischemia, that the thalami and cerebellum is stable since 2017. Electronically Signed   By: Genevie Ann M.D.   On: 04/26/2019 08:19    Lab Data:  CBC: Recent Labs  Lab 04/26/19 0016 04/26/19 0115  WBC 5.5  --   NEUTROABS 2.6  --   HGB 14.2 14.3  HCT 43.3 42.0  MCV 89.3  --   PLT 176  --    Basic Metabolic Panel: Recent Labs  Lab 04/26/19 0016 04/26/19 0115  NA 142 142  K 4.1 3.8  CL 105 104  CO2 26  --   GLUCOSE 126* 115*  BUN 15 16  CREATININE 1.17* 1.20*  CALCIUM 9.6  --    GFR: Estimated Creatinine Clearance: 37.8 mL/min (A) (by C-G formula based on SCr of 1.2 mg/dL (H)). Liver Function Tests: Recent Labs  Lab 04/26/19 0016  AST 24  ALT 8  ALKPHOS 46  BILITOT 0.6  PROT 6.6  ALBUMIN 3.7   No results for input(s): LIPASE, AMYLASE in the last 168 hours. No results for input(s): AMMONIA in the last 168 hours. Coagulation Profile: Recent Labs  Lab 04/26/19 0016  INR 0.9   Cardiac Enzymes: No results for input(s): CKTOTAL, CKMB, CKMBINDEX, TROPONINI in the last 168 hours. BNP (last 3 results) No results for input(s): PROBNP in the last 8760 hours. HbA1C: Recent Labs    04/26/19 1200  HGBA1C 5.7*   CBG: Recent Labs  Lab 04/26/19 0600  GLUCAP 95   Lipid Profile: Recent Labs    04/26/19 1200  CHOL 279*  HDL 43  LDLCALC 190*  TRIG 230*  CHOLHDL 6.5   Thyroid Function Tests: No results for input(s): TSH, T4TOTAL, FREET4, T3FREE, THYROIDAB in the last 72 hours. Anemia Panel: No results for input(s): VITAMINB12, FOLATE, FERRITIN, TIBC, IRON, RETICCTPCT in the last 72 hours. Urine analysis:    Component Value  Date/Time   COLORURINE YELLOW 12/26/2014 1030   APPEARANCEUR CLEAR 12/26/2014 1030   LABSPEC 1.020 12/26/2014 1030   PHURINE 5.0 12/26/2014 1030   GLUCOSEU NEGATIVE 12/26/2014 1030   HGBUR NEGATIVE 12/26/2014 1030   [place] 12/26/2014 1030   [place] 12/26/2014 1030   PROTEINUR NEGATIVE 12/26/2014 1030   UROBILINOGEN 0.2 12/26/2014 1030   NITRITE NEGATIVE 12/26/2014 1030   LEUKOCYTESUR NEGATIVE 12/26/2014 1030     Mosella Kasa M.D. [facility] 04/27/2019, 9:50 AM  Pager: [phone] Between 7am to 7pm - call Pager - [phone]  After 7pm go to [email or web address] - password [email or web address]  Call night coverage person covering after 7pm

## 2019-04-27 NOTE — TOC Transition Note (Signed)
Transition of Care [facility]) - CM/SW Discharge Note   Patient Details  Name: Crystal Day MRN: [id number] Date of Birth: 11-12-1934  [facility] of [facility] CM/SW Contact:  Pollie Friar, RN Phone Number: 04/27/2019, 1:55 PM   Clinical Narrative:    Pt is discharging home with self care.  Pt has PCP, insurance and transportation home.    Final next level of care: Home/Self Care Barriers to Discharge: No Barriers Identified   Patient Goals and CMS Choice        Discharge Placement                       Discharge Plan and Services                                     Social Determinants of Health (SDOH) Interventions     Readmission Risk Interventions No flowsheet data found.

## 2019-04-27 NOTE — Evaluation (Signed)
Physical Therapy Evaluation and Discharge  Patient Details Name: Crystal Day MRN: [id number] DOB: 03-13-1935 Today's Date: 04/27/2019   History of Present Illness  84 y.o. right-handed female with medical history significant of HTN, CAD, CVA/TIA, and remote tobacco use; who presented 04/26/19 with complaints of left arm numbness and weakness starting yesterday morning when she woke up. BP 220/110. At baseline patient complains of some mild shortness of breath with exertion, but states that this is unchanged. Negative COVID screen  Clinical Impression   Patient evaluated by Physical Therapy with no further acute PT needs identified. All education has been completed and the patient has no further questions. Patient scored 22/24 on Dynamic Gait Index and 48/56 on Berg Balance Assessment (both scores above the increased fall risk scores).  See below for any follow-up Physical Therapy or equipment needs. PT is signing off. Thank you for this referral.     Follow Up Recommendations No PT follow up    Equipment Recommendations  None recommended by PT    Recommendations for Other Services Speech consult     Precautions / Restrictions Precautions Precautions: None      Mobility  Bed Mobility Overal bed mobility: Independent                Transfers Overall transfer level: Independent Equipment used: None             General transfer comment: x 3 no issues  Ambulation/Gait Ambulation/Gait assistance: Independent Gait Distance (Feet): 400 Feet Assistive device: None Gait Pattern/deviations: WFL(Within Functional Limits) Gait velocity: able to vary up/down on command Gait velocity interpretation: 1.31 - 2.62 ft/sec, indicative of limited community ambulator General Gait Details: initially slower than her normal due to first time up on her feet;   Stairs            Wheelchair Mobility    Modified Rankin (Stroke Patients Only)       Balance                                  Standardized Balance Assessment Standardized Balance Assessment : Dynamic Gait Index;Berg Balance Test Berg Balance Test Sit to Stand: Able to stand without using hands and stabilize independently Standing Unsupported: Able to stand safely 2 minutes Sitting with Back Unsupported but Feet Supported on Floor or Stool: Able to sit safely and securely 2 minutes Stand to Sit: Sits safely with minimal use of hands Transfers: Able to transfer safely, minor use of hands Standing Unsupported with Eyes Closed: Able to stand 10 seconds safely Standing Ubsupported with Feet Together: Able to place feet together independently and stand 1 minute safely From Standing, Reach Forward with Outstretched Arm: Can reach confidently >25 cm (10") From Standing Position, Pick up Object from Floor: Able to pick up shoe safely and easily From Standing Position, Turn to Look Behind Over each Shoulder: Looks behind from both sides and weight shifts well Turn 360 Degrees: Able to turn 360 degrees safely but slowly Standing Unsupported, Alternately Place Feet on Step/Stool: Able to complete 4 steps without aid or supervision Standing Unsupported, One Foot in Front: Able to plae foot ahead of the other independently and hold 30 seconds Standing on One Leg: Tries to lift leg/unable to hold 3 seconds but remains standing independently Total Score: 48 Dynamic Gait Index Level Surface: Normal Change in Gait Speed: Normal Gait with Horizontal Head Turns: Normal Gait with Vertical Head  Turns: Normal Gait and Pivot Turn: Normal Step Over Obstacle: Mild Impairment Step Around Obstacles: Normal Steps: Mild Impairment Total Score: 22       Pertinent Vitals/Pain Pain Assessment: No/denies pain    Home Living Family/patient expects to be discharged to:: Private residence Living Arrangements: Alone Available Help at Discharge: Family;Available PRN/intermittently(daughter) Type of Home:  House Home Access: Stairs to enter Entrance Stairs-Rails: None(holds onto wall) Entrance Stairs-Number of Steps: 4 Home Layout: One level Home Equipment: Cane - single point      Prior Function Level of Independence: Independent         Comments: loves to work in her large yard; has not given up ladders but son has asked her to     Hand Dominance   Dominant Hand: Right    Extremity/Trunk Assessment   Upper Extremity Assessment Upper Extremity Assessment: Overall WFL for tasks assessed(coordination, sensation, strength)    Lower Extremity Assessment Lower Extremity Assessment: Overall WFL for tasks assessed    Cervical / Trunk Assessment Cervical / Trunk Assessment: Normal  Communication   Communication: No difficulties  Cognition Arousal/Alertness: Awake/alert Behavior During Therapy: WFL for tasks assessed/performed Overall Cognitive Status: Within Functional Limits for tasks assessed                                        General Comments General comments (skin integrity, edema, etc.): denies vision or cognitive changes; reports she had difficulty with numbers after her 1st stroke and this persists but is not worse (updated SLP re: her comment)    Exercises     Assessment/Plan    PT Assessment Patent does not need any further PT services  PT Problem List         PT Treatment Interventions      PT Goals (Current goals can be found in the Care Plan section)  Acute Rehab PT Goals Patient Stated Goal: get back to being as active in her yard as before PT Goal Formulation: All assessment and education complete, DC therapy    Frequency     Barriers to discharge        Co-evaluation               AM-PAC PT "6 Clicks" Mobility  Outcome Measure Help needed turning from your back to your side while in a flat bed without using bedrails?: None Help needed moving from lying on your back to sitting on the side of a flat bed without  using bedrails?: None Help needed moving to and from a bed to a chair (including a wheelchair)?: None Help needed standing up from a chair using your arms (e.g., wheelchair or bedside chair)?: None Help needed to walk in hospital room?: None Help needed climbing 3-5 steps with a railing? : None 6 Click Score: 24    End of Session Equipment Utilized During Treatment: Gait belt Activity Tolerance: Patient tolerated treatment well Patient left: in chair;with call bell/phone within reach Nurse Communication: Mobility status;Other (comment)(no PT needs) PT Visit Diagnosis: Other symptoms and signs involving the nervous system (R29.898)    Time: [id number] PT Time Calculation (min) (ACUTE ONLY): 43 min   Charges:   PT Evaluation $PT Eval Low Complexity: 1 Low PT Treatments $Gait Training: 23-37 mins          Barry Brunner, PT      [employer] 04/27/2019, 9:46 AM

## 2019-04-27 NOTE — Discharge Summary (Signed)
Physician Discharge Summary   Patient ID: LANCE KUMMER MRN: [id number] DOB/AGE: Sep 20, 1934 84 y.o.  Admit date: 04/26/2019 Discharge date: 04/27/2019  Primary Care Physician:  Carol Ada, MD   Recommendations for Outpatient Follow-up:  1. Follow up with PCP in 1-2 weeks 2. CT angiogram of the head and neck showed 17 mm left thyroid nodule.  TSH, free T4 are normal.  Please obtain thyroid ultrasound for further work-up. 3. Patient was started on Crestor 40 mg daily for hyperlipidemia.  Please check LFTs, CK in 4 weeks, per patient he has not tolerated Lipitor in the past. 4. Patient started on aspirin and Plavix for 4 weeks then Plavix 75 mg daily indefinitely  Home Health: None Equipment/Devices:   Discharge Condition: stable  CODE STATUS: FULL  Diet recommendation: Heart healthy diet   Discharge Diagnoses:    . Acute CVA (cerebral vascular accident) ([place]) . Essential hypertension . Hyperlipidemia . CKD (chronic kidney disease) stage 3, GFR 30-59 ml/min (HCC) . Non-obstructive CAD Incidental thyroid nodule seen on CTA  Consults: Neurology    Allergies:   Allergies  Allergen Reactions  . Penicillins Hives  . Tetracyclines & Related Nausea Only  . Elavil [Amitriptyline] Rash  . Erythromycin Rash  . Macrodantin [Nitrofurantoin Macrocrystal] Rash     DISCHARGE MEDICATIONS: Allergies as of 04/27/2019      Reactions   Penicillins Hives   Tetracyclines & Related Nausea Only   Elavil [amitriptyline] Rash   Erythromycin Rash   Macrodantin [nitrofurantoin Macrocrystal] Rash      Medication List    TAKE these medications   aspirin 81 MG EC tablet Take 1 tablet (81 mg total) by mouth daily. Take Aspirin for 4 weeks with plavix, then plavix alone indefinitely Start taking on: April 28, 2019   clopidogrel 75 MG tablet Commonly known as: PLAVIX Take 1 tablet (75 mg total) by mouth daily. Start taking on: April 28, 2019   hydrochlorothiazide 25 MG  tablet Commonly known as: HYDRODIURIL Take 25 mg by mouth daily.   metoprolol tartrate 25 MG tablet Commonly known as: LOPRESSOR Take 25 mg by mouth 2 (two) times daily.   nitroGLYCERIN 0.4 MG SL tablet Commonly known as: NITROSTAT Place 1 tablet (0.4 mg total) under the tongue every 5 (five) minutes as needed for chest pain.   rosuvastatin 40 MG tablet Commonly known as: Crestor Take 1 tablet (40 mg total) by mouth daily.        Brief H and P: For complete details please refer to admission H and P, but in brief Patient is 84 year old female with hypertension, CAD, prior CVA, remote tobacco use presented with left arm numbness, weakness that started a day before the admission when she woke up.  Patient reported left arm felt heavy and numb was unable to pick up her morning coffee.  She felt her left arm was falling when she tried to reach for things.  Also complained of headache, weakness above lower extremities, mild shortness of breath with exertion which is chronic.  CT head showed no acute abnormalities, MRI of the brain showed acute posterior right centrum ovale infarct with advanced chronic small vessel disease. COVID-19 test negative.  Hospital Course:   Acute CVA (cerebral vascular accident) ([place]) -Per patient left arm weakness is now improving, was able to lift and hold her cup this morning during exam -MRI confirmed small acute lacunar infarct in the posterior right centrum ovale near the motor strip -CT angiogram head and neck showed no significant  carotid artery stenosis in the neck, mild stenosis of the origin of the vertebral artery bilaterally negative for emergent large vessel occlusion. -2D echo showed EF of 60 to 65%, no PFO -Patient had taken herself off the cholesterol meds, LDL significantly elevated at 190, cholesterol 279, triglycerides 230, placed on statin -Hb A1c 5.7 -Patient was seen by stroke service, recommended aspirin 81 mg daily and Plavix 75 mg  daily for 4 weeks then continue Plavix 75 mg daily indefinitely -PT evaluation recommended no PT follow-up, back to baseline      Essential hypertension -Permissive hypertension inpatient due to acute CVA, restart antihypertensives at home    Hyperlipidemia - -Patient had taken herself off statin, LDL significantly elevated at 190, cholesterol 279, triglycerides 230, she was placed on Lipitor however patient reported that she has not tolerated Lipitor in the past -Discussed with neurology, placed on Crestor 40 mg daily.  She will follow-up with neurology, please check LFTs, CK in 4 weeks.  Thyroid nodule, incidental finding on CTA -CT angiogram of the head and neck showed 17 mm left thyroid nodule, explained to the patient in detail.  She will need follow-up thyroid ultrasound for further evaluation by her PCP -TSH 2.1, free T4 1.0    CKD (chronic kidney disease) stage 3, GFR 30-59 ml/min (HCC) -Baseline creatinine 1.1-1.3, currently at baseline -Follow outpatient with PCP, referral to nephrology    Day of Discharge S: Overall improving, left arm weakness is improving from the admission, able to hold her coffee cup today.  BP (!) 168/77 (BP Location: Left Arm)   Pulse 67   Temp 97.8 F (36.6 C) (Oral)   Resp 20   Ht 5\' 9"  (1.753 m)   Wt 70 kg   SpO2 98%   BMI 22.79 kg/m   Physical Exam: General: Alert and awake oriented x3 not in any acute distress. HEENT: anicteric sclera, pupils reactive to light and accommodation CVS: S1-S2 clear no murmur rubs or gallops Chest: clear to auscultation bilaterally, no wheezing rales or rhonchi Abdomen: soft nontender, nondistended, normal bowel sounds Extremities: no cyanosis, clubbing or edema noted bilaterally Neuro: Mild left arm weakness, improving from yesterday.  Right upper and lower extremities 5/5 strength   The results of significant diagnostics from this hospitalization (including imaging, microbiology, ancillary and  laboratory) are listed below for reference.      Procedures/Studies:  Ct Angio Head W Or Wo Contrast  Result Date: 04/26/2019 CLINICAL DATA:  Stroke follow-up EXAM: CT ANGIOGRAPHY HEAD AND NECK TECHNIQUE: Multidetector CT imaging of the head and neck was performed using the standard protocol during bolus administration of intravenous contrast. Multiplanar CT image reconstructions and MIPs were obtained to evaluate the vascular anatomy. Carotid stenosis measurements (when applicable) are obtained utilizing NASCET criteria, using the distal internal carotid diameter as the denominator. CONTRAST:  38mL OMNIPAQUE IOHEXOL 350 MG/ML SOLN COMPARISON:  CT head MRI head 04/26/2019 FINDINGS: CTA NECK FINDINGS Aortic arch: Standard branching. Imaged portion shows no evidence of aneurysm or dissection. No significant stenosis of the major arch vessel origins. Atherosclerotic calcification aortic arch Right carotid system: Minimal atherosclerotic disease right carotid bifurcation without stenosis. Negative for dissection Left carotid system: Mild atherosclerotic disease left carotid bifurcation without stenosis. Negative for dissection Vertebral arteries: Mild calcific stenosis of the origin of both vertebral arteries. Both vertebral arteries with and patent to the basilar without additional stenosis. Skeleton: Cervical spondylosis.  No acute skeletal abnormality. Other neck: Left thyroid nodule 17 mm. 9 mm right thyroid  nodule. No adenopathy in the neck. Upper chest: Apically scarring bilaterally.  No acute abnormality. Review of the MIP images confirms the above findings CTA HEAD FINDINGS Anterior circulation: Mild atherosclerotic calcification in the cavernous carotid bilaterally without significant stenosis. Anterior and middle cerebral arteries patent bilaterally without significant stenosis. Posterior circulation: Both vertebral arteries patent to the basilar. PICA patent bilaterally. Basilar widely patent. AICA,  superior cerebellar, and posterior cerebral arteries patent bilaterally without stenosis. Fetal origin left posterior cerebral artery. Venous sinuses: Normal venous enhancement Anatomic variants: None Review of the MIP images confirms the above findings IMPRESSION: 1. No significant carotid  artery stenosis in the neck 2. Mild stenosis of the origin of the vertebral artery bilaterally. 3. Negative for emergent large vessel occlusion. No significant intracranial stenosis 4. 17 mm left thyroid nodule. Recommend follow-up thyroid ultrasound for further evaluation. Electronically Signed   By: Franchot Gallo M.D.   On: 04/26/2019 17:33   Ct Head Wo Contrast  Result Date: 04/26/2019 CLINICAL DATA:  Headache, left arm numbness EXAM: CT HEAD WITHOUT CONTRAST TECHNIQUE: Contiguous axial images were obtained from the base of the skull through the vertex without intravenous contrast. COMPARISON:  10/27/2018 FINDINGS: Brain: No acute intracranial abnormality. Specifically, no hemorrhage, hydrocephalus, mass lesion, acute infarction, or significant intracranial injury. Vascular: No hyperdense vessel or unexpected calcification. Skull: No acute calvarial abnormality. Sinuses/Orbits: Mucosal thickening throughout the ethmoid air cells and right maxillary sinus. No air-fluid levels. Other: None IMPRESSION: No acute intracranial abnormality. Chronic sinusitis. Electronically Signed   By: Rolm Baptise M.D.   On: 04/26/2019 01:03   Ct Angio Neck W Or Wo Contrast  Result Date: 04/26/2019 CLINICAL DATA:  Stroke follow-up EXAM: CT ANGIOGRAPHY HEAD AND NECK TECHNIQUE: Multidetector CT imaging of the head and neck was performed using the standard protocol during bolus administration of intravenous contrast. Multiplanar CT image reconstructions and MIPs were obtained to evaluate the vascular anatomy. Carotid stenosis measurements (when applicable) are obtained utilizing NASCET criteria, using the distal internal carotid diameter as  the denominator. CONTRAST:  82mL OMNIPAQUE IOHEXOL 350 MG/ML SOLN COMPARISON:  CT head MRI head 04/26/2019 FINDINGS: CTA NECK FINDINGS Aortic arch: Standard branching. Imaged portion shows no evidence of aneurysm or dissection. No significant stenosis of the major arch vessel origins. Atherosclerotic calcification aortic arch Right carotid system: Minimal atherosclerotic disease right carotid bifurcation without stenosis. Negative for dissection Left carotid system: Mild atherosclerotic disease left carotid bifurcation without stenosis. Negative for dissection Vertebral arteries: Mild calcific stenosis of the origin of both vertebral arteries. Both vertebral arteries with and patent to the basilar without additional stenosis. Skeleton: Cervical spondylosis.  No acute skeletal abnormality. Other neck: Left thyroid nodule 17 mm. 9 mm right thyroid nodule. No adenopathy in the neck. Upper chest: Apically scarring bilaterally.  No acute abnormality. Review of the MIP images confirms the above findings CTA HEAD FINDINGS Anterior circulation: Mild atherosclerotic calcification in the cavernous carotid bilaterally without significant stenosis. Anterior and middle cerebral arteries patent bilaterally without significant stenosis. Posterior circulation: Both vertebral arteries patent to the basilar. PICA patent bilaterally. Basilar widely patent. AICA, superior cerebellar, and posterior cerebral arteries patent bilaterally without stenosis. Fetal origin left posterior cerebral artery. Venous sinuses: Normal venous enhancement Anatomic variants: None Review of the MIP images confirms the above findings IMPRESSION: 1. No significant carotid  artery stenosis in the neck 2. Mild stenosis of the origin of the vertebral artery bilaterally. 3. Negative for emergent large vessel occlusion. No significant intracranial stenosis 4. 17 mm  left thyroid nodule. Recommend follow-up thyroid ultrasound for further evaluation. Electronically  Signed   By: Franchot Gallo M.D.   On: 04/26/2019 17:33   Mr Brain Wo Contrast  Result Date: 04/26/2019 CLINICAL DATA:  84 year old female with left upper extremity heaviness, difficulty holding things in her hand. Headache on presentation, now resolved. EXAM: MRI HEAD WITHOUT CONTRAST TECHNIQUE: Multiplanar, multiecho pulse sequences of the brain and surrounding structures were obtained without intravenous contrast. COMPARISON:  Head CT earlier today.  Brain MRI 10/02/2015. FINDINGS: Brain: Small linear 7 millimeter focus of restricted diffusion in the posterior right centrum semiovale near the motor strip (series 2, image 35). Faint associated T2 and FLAIR hyperintensity. No associated hemorrhage or mass effect. No other restricted diffusion. No midline shift, mass effect, evidence of mass lesion, ventriculomegaly, extra-axial collection or acute intracranial hemorrhage. Cervicomedullary junction and pituitary are within normal limits. Multiple chronic lacunar infarcts in both thalami appear similar to the 2017 exam. Scattered small chronic infarcts in both cerebellar hemispheres are stable. Superimposed periventricular bilateral cerebral white matter T2 and FLAIR hyperintensity which also resembles small chronic infarcts on series 6, image 17. No cortical encephalomalacia. No definite chronic blood products. The basal ganglia and brainstem remain normal for age. Vascular: Major intracranial vascular flow voids are stable since 2017. Skull and upper cervical spine: Mild for age. Normal bone marrow disc degeneration in the visible cervical spine signal. Sinuses/Orbits: Stable orbits. Increased mild to moderate paranasal sinus mucosal thickening. Other: Mastoids remain clear.  Negative scalp and face soft tissues. IMPRESSION: 1. Small acute lacunar infarct in the posterior right centrum semiovale near the motor strip. No associated hemorrhage or mass effect. 2. Moderately advanced chronic small vessel ischemia,  that the thalami and cerebellum is stable since 2017. Electronically Signed   By: Genevie Ann M.D.   On: 04/26/2019 08:19       LAB RESULTS: Basic Metabolic Panel: Recent Labs  Lab 04/26/19 0016 04/26/19 0115  NA 142 142  K 4.1 3.8  CL 105 104  CO2 26  --   GLUCOSE 126* 115*  BUN 15 16  CREATININE 1.17* 1.20*  CALCIUM 9.6  --    Liver Function Tests: Recent Labs  Lab 04/26/19 0016  AST 24  ALT 8  ALKPHOS 46  BILITOT 0.6  PROT 6.6  ALBUMIN 3.7   No results for input(s): LIPASE, AMYLASE in the last 168 hours. No results for input(s): AMMONIA in the last 168 hours. CBC: Recent Labs  Lab 04/26/19 0016 04/26/19 0115  WBC 5.5  --   NEUTROABS 2.6  --   HGB 14.2 14.3  HCT 43.3 42.0  MCV 89.3  --   PLT 176  --    Cardiac Enzymes: No results for input(s): CKTOTAL, CKMB, CKMBINDEX, TROPONINI in the last 168 hours. BNP: Invalid input(s): POCBNP CBG: Recent Labs  Lab 04/26/19 0600  GLUCAP 95      Disposition and Follow-up: Discharge Instructions    Ambulatory referral to Neurology   Complete by: As directed    Follow up in stroke clinic at [facility] with Frann Rider, NP in about 4 weeks. If not available, consider Dr. Antony Contras, Dr. Bess Harvest, or Dr. Sarina Ill.   Diet - low sodium heart healthy   Complete by: As directed    Increase activity slowly   Complete by: As directed        DISPOSITION: [facility] Follow  up in 4 week(s).   Specialty: Neurology Why: stroke clinic. office will call with appt date and time.  Contact information: [address] [place] [phone]       Carol Ada, MD. Schedule an appointment as soon as possible for a visit in 2 week(s).   Specialty: Family Medicine Why: please discuss about thyroid ultrasound Contact information: [place], [place]  [address] [phone]            Time coordinating discharge:  35 minutes  Signed:   Estill Cotta M.D. [facility] 04/27/2019, 1:23 PM

## 2019-05-10 DIAGNOSIS — Z7902 Long term (current) use of antithrombotics/antiplatelets: Secondary | ICD-10-CM | POA: Diagnosis not present

## 2019-05-10 DIAGNOSIS — E041 Nontoxic single thyroid nodule: Secondary | ICD-10-CM | POA: Diagnosis not present

## 2019-05-10 DIAGNOSIS — Z09 Encounter for follow-up examination after completed treatment for conditions other than malignant neoplasm: Secondary | ICD-10-CM | POA: Diagnosis not present

## 2019-05-10 DIAGNOSIS — E7800 Pure hypercholesterolemia, unspecified: Secondary | ICD-10-CM | POA: Diagnosis not present

## 2019-05-10 DIAGNOSIS — I679 Cerebrovascular disease, unspecified: Secondary | ICD-10-CM | POA: Diagnosis not present

## 2019-05-10 DIAGNOSIS — I6350 Cerebral infarction due to unspecified occlusion or stenosis of unspecified cerebral artery: Secondary | ICD-10-CM | POA: Diagnosis not present

## 2019-05-10 DIAGNOSIS — Z5181 Encounter for therapeutic drug level monitoring: Secondary | ICD-10-CM | POA: Diagnosis not present

## 2019-05-16 ENCOUNTER — Other Ambulatory Visit: Payer: Self-pay | Admitting: Family Medicine

## 2019-05-16 DIAGNOSIS — E041 Nontoxic single thyroid nodule: Secondary | ICD-10-CM

## 2019-05-18 DIAGNOSIS — Z7902 Long term (current) use of antithrombotics/antiplatelets: Secondary | ICD-10-CM | POA: Diagnosis not present

## 2019-05-18 DIAGNOSIS — E7800 Pure hypercholesterolemia, unspecified: Secondary | ICD-10-CM | POA: Diagnosis not present

## 2019-05-19 ENCOUNTER — Ambulatory Visit
Admission: RE | Admit: 2019-05-19 | Discharge: 2019-05-19 | Disposition: A | Discharge status: Home / Self Care | Payer: PPO | Source: Ambulatory Visit | Attending: Family Medicine | Admitting: Family Medicine

## 2019-05-19 DIAGNOSIS — E041 Nontoxic single thyroid nodule: Secondary | ICD-10-CM | POA: Diagnosis not present

## 2019-05-25 ENCOUNTER — Telehealth: Payer: Self-pay

## 2019-05-25 ENCOUNTER — Inpatient Hospital Stay: Payer: PPO | Admitting: Adult Health

## 2019-05-25 ENCOUNTER — Other Ambulatory Visit: Payer: Self-pay | Admitting: Family Medicine

## 2019-05-25 DIAGNOSIS — E041 Nontoxic single thyroid nodule: Secondary | ICD-10-CM

## 2019-05-25 NOTE — Progress Notes (Deleted)
[facility] [address] [place]. [place] [address] [phone]       HOSPITAL FOLLOW UP NOTE  [name] Date of Birth:  24-Dec-1934 Medical Record Number:  [id number]   Reason for Referral:  hospital stroke follow up    CHIEF COMPLAINT:  No chief complaint on file.   HPI: Crystal C Atkinsis being seen today for in office hospital follow-up regarding right centrum semiovale infarct secondary to small vessel disease source on 04/26/2019.  History obtained from *** and chart review. Reviewed all radiology images and labs personally.  Crystal Day is a 84 y.o. 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 Stroke work up showed right centrum semiovale infarct as evidenced on MRI secondary to moderately advanced small vessel disease. CTA head/neck without LVO or significant stenosis and incidental finding 33mm L thyroid nodule.  2D echo showed normal EF without cardiac source of embolus identified.  LDL 190.  A1c 5.7.  Recommended DAPT for 3 weeks and aspirin alone as previously not on antithrombotics.  Prior stroke history in 02/2016 previously on Plavix and pravastatin but discontinued for unknown reasons.  HTN stable.  Initiated atorvastatin 80 mg daily for HLD management.  No evidence or history of DM.  Other stroke risk factors include advanced age, former tobacco use, family history of stroke and CAD status post  NSTEMI.  Other active problems include CKD and thyroid nodule and recommend follow-up with PCP.  No therapy needs which discharged home in stable condition on 04/27/2019.     ROS:   14 system review of systems performed and negative with exception of ***  PMH:  Past Medical  History:  Diagnosis Date   Anginal pain ([place]) since 2014   Arthritis    just in the hands   Cancer ([place]) 1988 or 89   basal cell carcinoma on face s/p Mohs procedure   Complication of anesthesia    nausea   History of wheezing    in spring and fall   Hyperlipidemia    Hypertension    Non-obstructive CAD    a. 09/2013 Cath/NSTEMI: nonobs dzs.   Non-STEMI (non-ST elevated myocardial infarction) ([place])    a. 09/2013 - peak trop 5.38;  b. 09/2013 Cath: nonobs dzs-->Med Rx.   Pneumonia    "in my 20's"   PONV (postoperative nausea and vomiting)    Stroke [facility]) 2009   a. 09/2015 L Thalamic Lacunar infarct, presumed to be 2/2 small vessel dzs, residuals include numbness on right side of lips and right finger tips; b. 09/2015 Echo: nl LV fxn; c. 09/2015 Carotid U/S: mild bilat ICA stenosis.    PSH:  Past Surgical History:  Procedure Laterality Date   ABDOMINAL HYSTERECTOMY  1974   BREAST SURGERY Left 1976   lumpectomy   EYE SURGERY     cataract surgery, both eyes   FRACTURE SURGERY Right 1971   thumb and index finger   LEFT HEART CATHETERIZATION WITH CORONARY ANGIOGRAM N/A 09/29/2013   Procedure: LEFT HEART CATHETERIZATION WITH CORONARY ANGIOGRAM;  Surgeon: Peter M Martinique, MD;  Location: [facility] CATH LAB;  Service: Cardiovascular;  Laterality: N/A;   PARTIAL KNEE ARTHROPLASTY Right 12/31/2014   Procedure: RIGHT KNEE MEDIAL UNICOMPARTMENTAL ARTHROPLASTY;  Surgeon: Gaynelle Arabian, MD;  Location: [facility] ORS;  Service: Orthopedics;  Laterality: Right;    Social History:  Social History   Socioeconomic History   Marital status: Widowed    Spouse name: Not on file   Number of children: Not on file   Years of education: Not on file   Highest education level: Not on file  Occupational History   Not on file  Social Needs   Financial resource strain: Not on file   Food insecurity    Worry: Not on file    Inability: Not on file   Transportation needs    Medical: Not on  file    Non-medical: Not on file  Tobacco Use   Smoking status: Former Smoker    Packs/day: 0.25    Years: 5.00    Pack years: 1.25    Quit date: 08/17/1981    Years since quitting: 37.7   Smokeless tobacco: Never Used  Substance and Sexual Activity   Alcohol use: No   Drug use: No   Sexual activity: Not on file  Lifestyle   Physical activity    Days per week: Not on file    Minutes per session: Not on file   Stress: Not on file  Relationships   Social connections    Talks on phone: Not on file    Gets together: Not on file    Attends religious service: Not on file    Active member of club or organization: Not on file    Attends meetings of clubs or organizations: Not on file    Relationship status: Not on file   Intimate partner violence    Fear of current or ex partner: Not on file    Emotionally abused: Not on file    Physically abused: Not on file    Forced sexual activity: Not on file  Other Topics Concern   Not on file  Social History Narrative   Not on file    Family History:  Family History  Problem Relation Age of Onset   Heart attack Father 57   Cancer Mother    Stroke Brother    Stroke Paternal Grandmother    Heart attack Maternal Grandfather     Medications:   Current Outpatient Medications on File Prior to Visit  Medication Sig Dispense Refill   aspirin EC 81 MG EC tablet Take 1 tablet (81 mg total) by mouth daily. Take Aspirin for 4 weeks with plavix, then plavix alone indefinitely 30 tablet 0   clopidogrel (PLAVIX) 75 MG tablet Take 1 tablet (75 mg total) by mouth daily. 30 tablet 4   hydrochlorothiazide (HYDRODIURIL) 25 MG tablet Take 25 mg by mouth daily.      metoprolol tartrate (LOPRESSOR) 25 MG tablet Take 25 mg by mouth 2 (two) times daily.     nitroGLYCERIN (NITROSTAT) 0.4 MG SL tablet Place 1 tablet (0.4 mg total) under the tongue every 5 (five) minutes as needed for chest pain. 25 tablet 3   rosuvastatin (CRESTOR) 40  MG tablet Take 1 tablet (40 mg total) by mouth daily. 30 tablet 4   No current facility-administered medications on file prior to visit.     Allergies:   Allergies  Allergen Reactions   Penicillins Hives   Tetracyclines & Related Nausea Only   Elavil [Amitriptyline] Rash   Erythromycin Rash   Macrodantin [Nitrofurantoin Macrocrystal] Rash     Physical Exam  There were no vitals filed for this visit. There is no height or weight on  file to calculate BMI. No exam data present  No flowsheet data found.   General: well developed, well nourished, seated, in no evident distress Head: head normocephalic and atraumatic.   Neck: supple with no carotid or supraclavicular bruits Cardiovascular: regular rate and rhythm, no murmurs Musculoskeletal: no deformity Skin:  no rash/petichiae Vascular:  Normal pulses all extremities   Neurologic Exam Mental Status: Awake and fully alert. Oriented to place and time. Recent and remote memory intact. Attention span, concentration and fund of knowledge appropriate. Mood and affect appropriate.  Cranial Nerves: Fundoscopic exam reveals sharp disc margins. Pupils equal, briskly reactive to light. Extraocular movements full without nystagmus. Visual fields full to confrontation. Hearing intact. Facial sensation intact. Face, tongue, palate moves normally and symmetrically.  Motor: Normal bulk and tone. Normal strength in all tested extremity muscles. Sensory.: intact to touch , pinprick , position and vibratory sensation.  Coordination: Rapid alternating movements normal in all extremities. Finger-to-nose and heel-to-shin performed accurately bilaterally. Gait and Station: Arises from chair without difficulty. Stance is normal. Gait demonstrates normal stride length and balance Reflexes: 1+ and symmetric. Toes downgoing.     NIHSS  *** Modified Rankin  *** CHA2DS2-VASc *** HAS-BLED ***   Diagnostic Data (Labs, Imaging, Testing)  CT HEAD  WO CONTRAST 04/26/2019 IMPRESSION: No acute intracranial abnormality. Chronic sinusitis.  CT ANGIO HEAD W OR WO CONTRAST CT ANGIO NECK W OR WO CONTRAST 04/26/2019 IMPRESSION: 1. No significant carotid  artery stenosis in the neck 2. Mild stenosis of the origin of the vertebral artery bilaterally. 3. Negative for emergent large vessel occlusion. No significant intracranial stenosis 4. 17 mm left thyroid nodule. Recommend follow-up thyroid ultrasound for further evaluation.  MR BRAIN WO CONTRAST 04/26/2019 IMPRESSION: 1. Small acute lacunar infarct in the posterior right centrum semiovale near the motor strip. No associated hemorrhage or mass effect. 2. Moderately advanced chronic small vessel ischemia, that the thalami and cerebellum is stable since 2017.  ECHOCARDIOGRAM 04/26/2019 IMPRESSIONS  1. The left ventricle has normal systolic function with an ejection fraction of 60-65%. The cavity size was normal. There is mildly increased left ventricular wall thickness. Left ventricular diastolic Doppler parameters are consistent with impaired  relaxation. Elevated left ventricular end-diastolic pressure The E/e' is >15. No evidence of left ventricular regional wall motion abnormalities.  2. The right ventricle has normal systolic function. The cavity was normal. There is no increase in right ventricular wall thickness.  3. The mitral valve is abnormal. Mild thickening of the mitral valve leaflet. Mild calcification of the mitral valve leaflet.  4. The tricuspid valve is grossly normal.  5. The aortic valve is tricuspid. No stenosis of the aortic valve.  6. The aorta is normal unless otherwise noted.    ASSESSMENT: Crystal Day is a 84 y.o. year old female presented with left arm weakness and decreased sensation x2 days on 04/26/2019 with stroke work-up revealing right centrum semiovale infarct secondary to moderately advanced small vessel disease. Vascular risk factors include prior  thalamic and remote bilateral thalamic and cerebellar lacunar infarcts, severe right P1 stenosis, NSTEMI, CAD, HTN, HLD and basal cell carcinoma.     PLAN:  1. *** : Continue {anticoagulants:31417}  and ***  for secondary stroke prevention. Maintain strict control of hypertension with blood pressure goal below 130/90, diabetes with hemoglobin A1c goal below 6.5% and cholesterol with LDL cholesterol (bad cholesterol) goal below 70 mg/dL.  I also advised the patient to eat a healthy diet with plenty of whole grains,  cereals, fruits and vegetables, exercise regularly with at least 30 minutes of continuous activity daily and maintain ideal body weight. 2. HTN: Advised to continue current treatment regimen.  Today's BP ***.  Advised to continue to monitor at home along with continued follow-up with PCP for management 3. HLD: Advised to continue current treatment regimen along with continued follow-up with PCP for future prescribing and monitoring of lipid panel 4. DMII: Advised to continue to monitor glucose levels at home along with continued follow-up with PCP for management and monitoring    Follow up in *** or call earlier if needed   Greater than 50% of time during this 45 minute visit was spent on counseling, explanation of diagnosis of ***, reviewing risk factor management of ***, planning of further management along with potential future management, and discussion with patient and family answering all questions.    Frann Rider, AGNP-BC  [facility] [address] [place], [place] [address]  Phone [phone] Fax [phone] Note: This document was prepared with digital dictation and possible smart phrase technology. Any transcriptional errors that result from this process are unintentional.

## 2019-05-25 NOTE — Telephone Encounter (Signed)
Patient was a no call/no show for their appointment today.   

## 2019-05-26 ENCOUNTER — Other Ambulatory Visit: Payer: Self-pay | Admitting: Family Medicine

## 2019-06-08 ENCOUNTER — Encounter: Payer: Self-pay | Admitting: Adult Health

## 2019-06-08 ENCOUNTER — Ambulatory Visit: Payer: PPO | Admitting: Adult Health

## 2019-06-08 ENCOUNTER — Other Ambulatory Visit: Payer: Self-pay

## 2019-06-08 VITALS — BP 146/79 | HR 62 | Temp 97.7°F | Ht 69.0 in | Wt 151.0 lb

## 2019-06-08 DIAGNOSIS — I10 Essential (primary) hypertension: Secondary | ICD-10-CM | POA: Diagnosis not present

## 2019-06-08 DIAGNOSIS — E785 Hyperlipidemia, unspecified: Secondary | ICD-10-CM | POA: Diagnosis not present

## 2019-06-08 DIAGNOSIS — I6381 Other cerebral infarction due to occlusion or stenosis of small artery: Secondary | ICD-10-CM

## 2019-06-08 NOTE — Progress Notes (Signed)
[facility] [address] [place]. [place] [address] [phone]       HOSPITAL FOLLOW UP NOTE  [name] Date of Birth:  [birth year] Medical Record Number:  [id number]   Reason for Referral:  hospital stroke follow up    CHIEF COMPLAINT:  Chief Complaint  Patient presents with  . Follow-up    room 9, doing well. Arm is better. No concerns.    HPI: Crystal C Atkinsis being seen today for in office hospital follow-up regarding right centrum semiovale infarct secondary to small vessel disease source on 04/26/2019.  History obtained from patient and chart review. Reviewed all radiology images and labs personally.  [name] is a 84 y.o. 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 Stroke work up showed right centrum semiovale infarct as evidenced on MRI secondary to moderately advanced small vessel disease. CTA head/neck without LVO or significant stenosis and incidental finding 56mm L thyroid nodule.  2D echo showed normal EF without cardiac source of embolus identified.  LDL 190.  A1c 5.7.  Recommended DAPT for 3 weeks and aspirin alone as previously not on antithrombotics.  Prior stroke history in 02/2016 previously on Plavix and pravastatin but discontinued for unknown reasons.  HTN stable.  Initiated atorvastatin 80 mg daily for HLD management.  No evidence or history of DM.  Other stroke risk factors include advanced age, former tobacco use, family history of stroke and CAD status post  NSTEMI.  Other active problems include CKD and thyroid nodule and recommend follow-up with PCP.  No therapy needs which discharged home in stable condition on 04/27/2019.  [name] is being seen today for  hospital follow-up.  She has been doing well from a stroke standpoint without residual deficits. She has returned back to all prior activities without difficulty.  Completed 3 weeks DAPT and continues on plavix alone without bleeding or bruising.  Continues on atorvastatin without myalgias.  Blood pressure today 146/79.  Denies any worsening stroke/TIA symptoms     ROS:   14 system review of systems performed and negative with exception of no complaints  PMH:  Past Medical History:  Diagnosis Date  . Anginal pain ([place]) since 2014  . Arthritis    just in the hands  . Cancer ([place]) 1988 or 89   basal cell carcinoma on face s/p Mohs procedure  . Complication of anesthesia    nausea  . History of wheezing    in spring and fall  . Hyperlipidemia   . Hypertension   . Non-obstructive CAD    a. 09/2013 Cath/NSTEMI: nonobs dzs.  . Non-STEMI (non-ST elevated myocardial infarction) ([place])    a. 09/2013 - peak trop 5.38;  b. 09/2013 Cath: nonobs dzs-->Med Rx.  . Pneumonia    "in my 20's"  . PONV (postoperative nausea and vomiting)   . Stroke [facility]) 2009   a. 09/2015 L Thalamic Lacunar infarct, presumed to be 2/2 small vessel dzs, residuals include numbness on right side of lips and right finger tips; b. 09/2015 Echo: nl LV fxn; c. 09/2015 Carotid U/S: mild bilat ICA stenosis.    PSH:  Past Surgical History:  Procedure Laterality Date  . ABDOMINAL HYSTERECTOMY  1974  . BREAST SURGERY Left 1976  lumpectomy  . EYE SURGERY     cataract surgery, both eyes  . FRACTURE SURGERY Right 1971   thumb and index finger  . LEFT HEART CATHETERIZATION WITH CORONARY ANGIOGRAM N/A 09/29/2013   Procedure: LEFT HEART CATHETERIZATION WITH CORONARY ANGIOGRAM;  Surgeon: Peter M Martinique, MD;  Location: [facility] CATH LAB;  Service: Cardiovascular;  Laterality: N/A;  . PARTIAL KNEE ARTHROPLASTY Right 12/31/2014   Procedure: RIGHT KNEE MEDIAL UNICOMPARTMENTAL ARTHROPLASTY;  Surgeon: Gaynelle Arabian, MD;  Location: [facility] ORS;   Service: Orthopedics;  Laterality: Right;    Social History:  Social History   Socioeconomic History  . Marital status: Widowed    Spouse name: Not on file  . Number of children: Not on file  . Years of education: Not on file  . Highest education level: Not on file  Occupational History  . Not on file  Social Needs  . Financial resource strain: Not on file  . Food insecurity    Worry: Not on file    Inability: Not on file  . Transportation needs    Medical: Not on file    Non-medical: Not on file  Tobacco Use  . Smoking status: Former Smoker    Packs/day: 0.25    Years: 5.00    Pack years: 1.25    Quit date: 08/17/1981    Years since quitting: 37.8  . Smokeless tobacco: Never Used  Substance and Sexual Activity  . Alcohol use: No  . Drug use: No  . Sexual activity: Not on file  Lifestyle  . Physical activity    Days per week: Not on file    Minutes per session: Not on file  . Stress: Not on file  Relationships  . Social Herbalist on phone: Not on file    Gets together: Not on file    Attends religious service: Not on file    Active member of club or organization: Not on file    Attends meetings of clubs or organizations: Not on file    Relationship status: Not on file  . Intimate partner violence    Fear of current or ex partner: Not on file    Emotionally abused: Not on file    Physically abused: Not on file    Forced sexual activity: Not on file  Other Topics Concern  . Not on file  Social History Narrative  . Not on file    Family History:  Family History  Problem Relation Age of Onset  . Heart attack Father 19  . Cancer Mother   . Stroke Brother   . Stroke Paternal Grandmother   . Heart attack Maternal Grandfather     Medications:   Current Outpatient Medications on File Prior to Visit  Medication Sig Dispense Refill  . clopidogrel (PLAVIX) 75 MG tablet Take 1 tablet (75 mg total) by mouth daily. 30 tablet 4  . hydrochlorothiazide  (HYDRODIURIL) 25 MG tablet Take 25 mg by mouth daily.     . metoprolol tartrate (LOPRESSOR) 25 MG tablet Take 25 mg by mouth 2 (two) times daily.    . nitroGLYCERIN (NITROSTAT) 0.4 MG SL tablet Place 1 tablet (0.4 mg total) under the tongue every 5 (five) minutes as needed for chest pain. 25 tablet 3  . atorvastatin (LIPITOR) 40 MG tablet Take 40 mg by mouth daily.     No current facility-administered medications on file prior to visit.     Allergies:   Allergies  Allergen Reactions  .  Penicillins Hives  . Tetracyclines & Related Nausea Only  . Elavil [Amitriptyline] Rash  . Erythromycin Rash  . Macrodantin [Nitrofurantoin Macrocrystal] Rash     Physical Exam  Vitals:   06/08/19 1415  BP: (!) 146/79  Pulse: 62  Temp: 97.7 F (36.5 C)  Weight: 151 lb (68.5 kg)  Height: 5\' 9"  (1.753 m)   Body mass index is 22.3 kg/m. No exam data present  No flowsheet data found.   General: well developed, well nourished, pleasant elderly Caucasian female, seated, in no evident distress Head: head normocephalic and atraumatic.   Neck: supple with no carotid or supraclavicular bruits Cardiovascular: regular rate and rhythm, no murmurs Musculoskeletal: no deformity Skin:  no rash/petichiae Vascular:  Normal pulses all extremities   Neurologic Exam Mental Status: Awake and fully alert. Oriented to place and time. Recent and remote memory intact. Attention span, concentration and fund of knowledge appropriate. Mood and affect appropriate.  Cranial Nerves: Fundoscopic exam reveals sharp disc margins. Pupils equal, briskly reactive to light. Extraocular movements full without nystagmus. Visual fields full to confrontation. Hearing intact. Facial sensation intact. Face, tongue, palate moves normally and symmetrically.  Motor: Normal bulk and tone. Normal strength in all tested extremity muscles. Sensory.: intact to touch , pinprick , position and vibratory sensation.  Coordination: Rapid  alternating movements normal in all extremities. Finger-to-nose and heel-to-shin performed accurately bilaterally. Gait and Station: Arises from chair without difficulty. Stance is normal. Gait demonstrates normal stride length and balance Reflexes: 1+ and symmetric. Toes downgoing.     NIHSS  0 Modified Rankin  0    Diagnostic Data (Labs, Imaging, Testing)  CT HEAD WO CONTRAST 04/26/2019 IMPRESSION: No acute intracranial abnormality. Chronic sinusitis.  CT ANGIO HEAD W OR WO CONTRAST CT ANGIO NECK W OR WO CONTRAST 04/26/2019 IMPRESSION: 1. No significant carotid  artery stenosis in the neck 2. Mild stenosis of the origin of the vertebral artery bilaterally. 3. Negative for emergent large vessel occlusion. No significant intracranial stenosis 4. 17 mm left thyroid nodule. Recommend follow-up thyroid ultrasound for further evaluation.  MR BRAIN WO CONTRAST 04/26/2019 IMPRESSION: 1. Small acute lacunar infarct in the posterior right centrum semiovale near the motor strip. No associated hemorrhage or mass effect. 2. Moderately advanced chronic small vessel ischemia, that the thalami and cerebellum is stable since 2017.  ECHOCARDIOGRAM 04/26/2019 IMPRESSIONS  1. The left ventricle has normal systolic function with an ejection fraction of 60-65%. The cavity size was normal. There is mildly increased left ventricular wall thickness. Left ventricular diastolic Doppler parameters are consistent with impaired  relaxation. Elevated left ventricular end-diastolic pressure The E/e' is >15. No evidence of left ventricular regional wall motion abnormalities.  2. The right ventricle has normal systolic function. The cavity was normal. There is no increase in right ventricular wall thickness.  3. The mitral valve is abnormal. Mild thickening of the mitral valve leaflet. Mild calcification of the mitral valve leaflet.  4. The tricuspid valve is grossly normal.  5. The aortic valve is tricuspid.  No stenosis of the aortic valve.  6. The aorta is normal unless otherwise noted.    ASSESSMENT: Crystal Day is a 84 y.o. year old female presented with left arm weakness and decreased sensation x2 days on 04/26/2019 with stroke work-up revealing right centrum semiovale infarct secondary to moderately advanced small vessel disease. Vascular risk factors include prior thalamic and remote bilateral thalamic and cerebellar lacunar infarcts, severe right P1 stenosis, NSTEMI, CAD, HTN, HLD and basal  cell carcinoma. Recovered well from a stroke standpoint without residual deficits    PLAN:  1. Right centrum semiovale stroke : Continue clopidogrel 75 mg daily  and lipitor  for secondary stroke prevention. Maintain strict control of hypertension with blood pressure goal below 130/90, diabetes with hemoglobin A1c goal below 6.5% and cholesterol with LDL cholesterol (bad cholesterol) goal below 70 mg/dL.  I also advised the patient to eat a healthy diet with plenty of whole grains, cereals, fruits and vegetables, exercise regularly with at least 30 minutes of continuous activity daily and maintain ideal body weight. 2. HTN: Advised to continue current treatment regimen.  Today's BP 146/79.  Advised to continue to monitor at home along with continued follow-up with PCP for management 3. HLD: Advised to continue current treatment regimen along with continued follow-up with PCP for future prescribing and monitoring of lipid panel    Follow up in 6 months or call earlier if needed   Greater than 50% of time during this 45 minute visit was spent on counseling, explanation of diagnosis of right centrum semiovale stroke, reviewing risk factor management of HTN and HLD, planning of further management along with potential future management, and discussion with patient and family answering all questions.    Frann Rider, AGNP-BC  [facility] [address] [place], [place]  [address]  Phone [phone] Fax [phone] Note: This document was prepared with digital dictation and possible smart phrase technology. Any transcriptional errors that result from this process are unintentional.

## 2019-06-08 NOTE — Progress Notes (Signed)
I agree with the above plan 

## 2019-06-08 NOTE — Patient Instructions (Signed)
Continue clopidogrel 75 mg daily  and Lipitor for secondary stroke prevention  Continue to follow up with PCP regarding cholesterol and blood pressure management   Continue to monitor blood pressure at home  Maintain strict control of hypertension with blood pressure goal below 130/90, diabetes with hemoglobin A1c goal below 6.5% and cholesterol with LDL cholesterol (bad cholesterol) goal below 70 mg/dL. I also advised the patient to eat a healthy diet with plenty of whole grains, cereals, fruits and vegetables, exercise regularly and maintain ideal body weight.  Followup in the future with me in 6 months or call earlier if needed       Thank you for coming to see us at [facility]. I hope we have been able to provide you high quality care today.  You may receive a patient satisfaction survey over the next few weeks. We would appreciate your feedback and comments so that we may continue to improve ourselves and the health of our patients.  

## 2019-06-09 ENCOUNTER — Encounter (INDEPENDENT_AMBULATORY_CARE_PROVIDER_SITE_OTHER): Payer: Self-pay

## 2019-06-22 ENCOUNTER — Ambulatory Visit
Admission: RE | Admit: 2019-06-22 | Discharge: 2019-06-22 | Disposition: A | Discharge status: Home / Self Care | Payer: PPO | Source: Ambulatory Visit | Attending: Family Medicine | Admitting: Family Medicine

## 2019-06-22 ENCOUNTER — Other Ambulatory Visit (HOSPITAL_COMMUNITY)
Admission: RE | Admit: 2019-06-22 | Discharge: 2019-06-22 | Disposition: A | Discharge status: Home / Self Care | Payer: PPO | Source: Ambulatory Visit | Attending: Radiology | Admitting: Radiology

## 2019-06-22 DIAGNOSIS — E042 Nontoxic multinodular goiter: Secondary | ICD-10-CM | POA: Diagnosis not present

## 2019-06-22 DIAGNOSIS — E041 Nontoxic single thyroid nodule: Secondary | ICD-10-CM | POA: Diagnosis not present

## 2019-06-23 LAB — CYTOLOGY - NON PAP

## 2019-07-04 DIAGNOSIS — R0602 Shortness of breath: Secondary | ICD-10-CM | POA: Diagnosis not present

## 2019-07-04 DIAGNOSIS — R062 Wheezing: Secondary | ICD-10-CM | POA: Diagnosis not present

## 2019-07-17 DIAGNOSIS — I214 Non-ST elevation (NSTEMI) myocardial infarction: Secondary | ICD-10-CM | POA: Diagnosis not present

## 2019-07-17 DIAGNOSIS — I2510 Atherosclerotic heart disease of native coronary artery without angina pectoris: Secondary | ICD-10-CM | POA: Diagnosis not present

## 2019-07-17 DIAGNOSIS — I6789 Other cerebrovascular disease: Secondary | ICD-10-CM | POA: Diagnosis not present

## 2019-07-17 DIAGNOSIS — I10 Essential (primary) hypertension: Secondary | ICD-10-CM | POA: Diagnosis not present

## 2019-07-17 DIAGNOSIS — J449 Chronic obstructive pulmonary disease, unspecified: Secondary | ICD-10-CM | POA: Diagnosis not present

## 2019-07-17 DIAGNOSIS — E7800 Pure hypercholesterolemia, unspecified: Secondary | ICD-10-CM | POA: Diagnosis not present

## 2019-07-17 DIAGNOSIS — I6350 Cerebral infarction due to unspecified occlusion or stenosis of unspecified cerebral artery: Secondary | ICD-10-CM | POA: Diagnosis not present

## 2019-07-17 DIAGNOSIS — E782 Mixed hyperlipidemia: Secondary | ICD-10-CM | POA: Diagnosis not present

## 2019-07-17 DIAGNOSIS — I63212 Cerebral infarction due to unspecified occlusion or stenosis of left vertebral arteries: Secondary | ICD-10-CM | POA: Diagnosis not present

## 2019-08-14 DIAGNOSIS — J40 Bronchitis, not specified as acute or chronic: Secondary | ICD-10-CM | POA: Diagnosis not present

## 2019-08-17 ENCOUNTER — Encounter (HOSPITAL_COMMUNITY): Payer: Self-pay

## 2019-08-17 ENCOUNTER — Ambulatory Visit (HOSPITAL_COMMUNITY)
Admission: EM | Admit: 2019-08-17 | Discharge: 2019-08-17 | Disposition: A | Discharge status: Home / Self Care | Payer: PPO | Attending: Family Medicine | Admitting: Family Medicine

## 2019-08-17 ENCOUNTER — Other Ambulatory Visit: Payer: Self-pay

## 2019-08-17 DIAGNOSIS — W19XXXA Unspecified fall, initial encounter: Secondary | ICD-10-CM

## 2019-08-17 DIAGNOSIS — S0101XA Laceration without foreign body of scalp, initial encounter: Secondary | ICD-10-CM

## 2019-08-17 DIAGNOSIS — W01198A Fall on same level from slipping, tripping and stumbling with subsequent striking against other object, initial encounter: Secondary | ICD-10-CM

## 2019-08-17 DIAGNOSIS — Z23 Encounter for immunization: Secondary | ICD-10-CM

## 2019-08-17 MED ORDER — TETANUS-DIPHTH-ACELL PERTUSSIS 5-2.5-18.5 LF-MCG/0.5 IM SUSP
0.5000 mL | Freq: Once | INTRAMUSCULAR | Status: AC
  Administered 2019-08-17: 0.5 mL via INTRAMUSCULAR

## 2019-08-17 MED ORDER — TETANUS-DIPHTH-ACELL PERTUSSIS 5-2.5-18.5 LF-MCG/0.5 IM SUSP
INTRAMUSCULAR | Status: AC
  Filled 2019-08-17: qty 0.5

## 2019-08-17 NOTE — ED Notes (Signed)
Pt did not take blood pressure medication this morning yet.

## 2019-08-17 NOTE — ED Triage Notes (Signed)
Pt presents with small laceration on the back of her head from falling and hitting it on a cabinet knob this morning.  Pt did not have a LOC

## 2019-08-17 NOTE — ED Provider Notes (Signed)
[place]    CSN: [id number] Arrival date & time: 08/17/19  1001      History   Chief Complaint Chief Complaint  Patient presents with  . Fall  . Laceration    HPI Amberlea C Bitney is a 84 y.o. female.   HPI  Patient was cooking in her kitchen at her home, and tripped over a door that was left open.  She fell backwards and hit the back of her head on another cabinet.  She has a laceration from the cabinet knob.  Her fall was witnessed by family member.  She did not faint or feel dizzy.  She did not lose consciousness.  She has no other complaint except for her laceration.  Bleeding was stopped with pressure.  Last tetanus is unknown Patient has a history of cardiovascular disease, cerebrovascular disease, asthma.  She lives alone.  Past Medical History:  Diagnosis Date  . Anginal pain ([place]) since 2014  . Arthritis    just in the hands  . Cancer ([place]) 1988 or 89   basal cell carcinoma on face s/p Mohs procedure  . Complication of anesthesia    nausea  . History of wheezing    in spring and fall  . Hyperlipidemia   . Hypertension   . Non-obstructive CAD    a. 09/2013 Cath/NSTEMI: nonobs dzs.  . Non-STEMI (non-ST elevated myocardial infarction) ([place])    a. 09/2013 - peak trop 5.38;  b. 09/2013 Cath: nonobs dzs-->Med Rx.  . Pneumonia    "in my 20's"  . PONV (postoperative nausea and vomiting)   . Stroke [facility]) 2009   a. 09/2015 L Thalamic Lacunar infarct, presumed to be 2/2 small vessel dzs, residuals include numbness on right side of lips and right finger tips; b. 09/2015 Echo: nl LV fxn; c. 09/2015 Carotid U/S: mild bilat ICA stenosis.    Patient Active Problem List   Diagnosis Date Noted  . CVA (cerebral vascular accident) ([place]) 04/26/2019  . Non-STEMI (non-ST elevated myocardial infarction) ([place])   . Non-obstructive CAD   . Hypertension   . TIA (transient ischemic attack)   . Lacunar infarct, acute ([place]) 10/03/2015  . CKD (chronic kidney disease) stage 3,  GFR 30-59 ml/min (HCC) 10/03/2015  . Arterial ischemic stroke, vertebrobasilar, thalamic, acute, left ([place])   . HLD (hyperlipidemia)   . OA (osteoarthritis) of knee 12/31/2014  . Syncope 09/28/2014  . Hyperlipidemia 04/17/2014  . Atherosclerosis of native coronary artery of native heart without angina pectoris 10/05/2013  . NSTEMI (non-ST elevated myocardial infarction) ([place]) 09/29/2013  . Chest pain 09/28/2013  . Essential hypertension 09/28/2013  . Acute renal failure ([place]) 09/28/2013    Past Surgical History:  Procedure Laterality Date  . ABDOMINAL HYSTERECTOMY  1974  . BREAST SURGERY Left 1976   lumpectomy  . EYE SURGERY     cataract surgery, both eyes  . FRACTURE SURGERY Right 1971   thumb and index finger  . LEFT HEART CATHETERIZATION WITH CORONARY ANGIOGRAM N/A 09/29/2013   Procedure: LEFT HEART CATHETERIZATION WITH CORONARY ANGIOGRAM;  Surgeon: Peter M Martinique, MD;  Location: [facility] CATH LAB;  Service: Cardiovascular;  Laterality: N/A;  . PARTIAL KNEE ARTHROPLASTY Right 12/31/2014   Procedure: RIGHT KNEE MEDIAL UNICOMPARTMENTAL ARTHROPLASTY;  Surgeon: Gaynelle Arabian, MD;  Location: [facility] ORS;  Service: Orthopedics;  Laterality: Right;    OB History   No obstetric history on file.      Home Medications    Prior to Admission medications  Medication Sig Start Date End Date Taking? Authorizing Provider  albuterol (PROVENTIL) (2.5 MG/3ML) 0.083% nebulizer solution Take 2.5 mg by nebulization every 6 (six) hours as needed for wheezing or shortness of breath.   Yes [provider]  cephALEXin (KEFLEX) 500 MG capsule Take 500 mg by mouth 4 (four) times daily.   Yes [provider]  predniSONE (DELTASONE) 10 MG tablet Take 10 mg by mouth daily with breakfast.   Yes [provider]  atorvastatin (LIPITOR) 40 MG tablet Take 40 mg by mouth daily. 05/23/19   [provider]  clopidogrel (PLAVIX) 75 MG tablet Take 1 tablet (75 mg total) by mouth daily.  04/28/19   Rai, Vernelle Emerald, MD  hydrochlorothiazide (HYDRODIURIL) 25 MG tablet Take 25 mg by mouth daily.     [provider]  metoprolol tartrate (LOPRESSOR) 25 MG tablet Take 25 mg by mouth 2 (two) times daily. 03/28/19   [provider]  nitroGLYCERIN (NITROSTAT) 0.4 MG SL tablet Place 1 tablet (0.4 mg total) under the tongue every 5 (five) minutes as needed for chest pain. 03/11/16   Theora Gianotti, NP    Family History Family History  Problem Relation Age of Onset  . Heart attack Father 40  . Cancer Mother   . Stroke Brother   . Stroke Paternal Grandmother   . Heart attack Maternal Grandfather     Social History Social History   Tobacco Use  . Smoking status: Former Smoker    Packs/day: 0.25    Years: 5.00    Pack years: 1.25    Quit date: 08/17/1981    Years since quitting: 38.0  . Smokeless tobacco: Never Used  Substance Use Topics  . Alcohol use: No  . Drug use: No     Allergies   Penicillins, Tetracyclines & related, Elavil [amitriptyline], Erythromycin, and Macrodantin [nitrofurantoin macrocrystal]   Review of Systems Review of Systems  Constitutional: Negative for chills and fever.  HENT: Negative for congestion and hearing loss.   Eyes: Negative for pain.  Respiratory: Negative for cough and shortness of breath.   Cardiovascular: Negative for chest pain and leg swelling.  Gastrointestinal: Negative for abdominal pain, constipation and diarrhea.  Genitourinary: Negative for dysuria and frequency.  Musculoskeletal: Negative for myalgias.  Skin: Positive for wound.  Neurological: Negative for dizziness, seizures and headaches.  Psychiatric/Behavioral: The patient is not nervous/anxious.      Physical Exam Triage Vital Signs ED Triage Vitals  Enc Vitals Group     BP 08/17/19 1138 (!) 189/98     Pulse Rate 08/17/19 1138 78     Resp --      Temp 08/17/19 1138 97.9 F (36.6 C)     Temp Source 08/17/19 1138 Oral     SpO2  08/17/19 1138 99 %     Weight --      Height --      Head Circumference --      Peak Flow --      Pain Score 08/17/19 1135 4     Pain Loc --      Pain Edu? --      Excl. in [place]? --    No data found.  Updated Vital Signs BP (!) 189/98 (BP Location: Right Arm)   Pulse 78   Temp 97.9 F (36.6 C) (Oral)   SpO2 99%      Physical Exam Constitutional:      General: She is not in acute distress.  Appearance: Normal appearance. She is well-developed and normal weight.     Comments: Alert.  No distress  HENT:     Head: Normocephalic and atraumatic.   Eyes:     Conjunctiva/sclera: Conjunctivae normal.     Pupils: Pupils are equal, round, and reactive to light.  Cardiovascular:     Rate and Rhythm: Normal rate.  Pulmonary:     Effort: Pulmonary effort is normal. No respiratory distress.  Abdominal:     General: There is no distension.     Palpations: Abdomen is soft.  Musculoskeletal:        General: Normal range of motion.     Cervical back: Normal range of motion.  Skin:    General: Skin is warm and dry.  Neurological:     Mental Status: She is alert.      UC Treatments / Results  Labs (all labs ordered are listed, but only abnormal results are displayed) Labs Reviewed - No data to display  EKG   Radiology No results found.  Procedures Procedures (including critical care time)  Medications Ordered in UC Medications  Tdap (BOOSTRIX) injection 0.5 mL (0.5 mLs Intramuscular Given 08/17/19 1210)    Initial Impression / Assessment and Plan / UC Course  I have reviewed the triage vital signs and the nursing notes.  Pertinent labs & imaging results that were available during my care of the patient were reviewed by me and considered in my medical decision making (see chart for details).     Area is cleansed with hydrogen peroxide.  The 2 wounds are glued shut.  Good results.  Wound care discussed. Final Clinical Impressions(s) / UC Diagnoses   Final  diagnoses:  Scalp laceration, initial encounter  Fall in home, initial encounter     Discharge Instructions     Keep area clean and reasonably dry Comb around area Wash hair normally in 5 days Glue will wear off over time   ED Prescriptions    None     PDMP not reviewed this encounter.   Raylene Everts, MD 08/17/19 [phone]

## 2019-08-17 NOTE — Discharge Instructions (Addendum)
Keep area clean and reasonably dry Comb around area Wash hair normally in 5 days Glue will wear off over time

## 2019-09-07 ENCOUNTER — Ambulatory Visit: Payer: PPO | Attending: Internal Medicine

## 2019-09-07 DIAGNOSIS — Z23 Encounter for immunization: Secondary | ICD-10-CM | POA: Insufficient documentation

## 2019-09-07 NOTE — Progress Notes (Signed)
   Covid-19 Vaccination Clinic  Name:  Crystal Day    MRN: [id number] DOB: 01-12-1935  09/07/2019  [name] was observed post Covid-19 immunization for 30 minutes based on pre-vaccination screening without incidence. She was provided with Vaccine Information Sheet and instruction to access the V-Safe system.   [name] was instructed to call 911 with any severe reactions post vaccine: Marland Kitchen Difficulty breathing  . Swelling of your face and throat  . A fast heartbeat  . A bad rash all over your body  . Dizziness and weakness    Immunizations Administered    Name Date Dose VIS Date Route   Pfizer COVID-19 Vaccine 09/07/2019  4:33 PM 0.3 mL 07/28/2019 Intramuscular   Manufacturer: [place]   Lot: [id number]   [place]: [id number]

## 2019-09-25 DIAGNOSIS — I6350 Cerebral infarction due to unspecified occlusion or stenosis of unspecified cerebral artery: Secondary | ICD-10-CM | POA: Diagnosis not present

## 2019-09-25 DIAGNOSIS — E782 Mixed hyperlipidemia: Secondary | ICD-10-CM | POA: Diagnosis not present

## 2019-09-25 DIAGNOSIS — I214 Non-ST elevation (NSTEMI) myocardial infarction: Secondary | ICD-10-CM | POA: Diagnosis not present

## 2019-09-25 DIAGNOSIS — I63212 Cerebral infarction due to unspecified occlusion or stenosis of left vertebral arteries: Secondary | ICD-10-CM | POA: Diagnosis not present

## 2019-09-25 DIAGNOSIS — J4530 Mild persistent asthma, uncomplicated: Secondary | ICD-10-CM | POA: Diagnosis not present

## 2019-09-25 DIAGNOSIS — I6789 Other cerebrovascular disease: Secondary | ICD-10-CM | POA: Diagnosis not present

## 2019-09-25 DIAGNOSIS — I10 Essential (primary) hypertension: Secondary | ICD-10-CM | POA: Diagnosis not present

## 2019-09-25 DIAGNOSIS — E7800 Pure hypercholesterolemia, unspecified: Secondary | ICD-10-CM | POA: Diagnosis not present

## 2019-09-25 DIAGNOSIS — I2510 Atherosclerotic heart disease of native coronary artery without angina pectoris: Secondary | ICD-10-CM | POA: Diagnosis not present

## 2019-09-25 DIAGNOSIS — J449 Chronic obstructive pulmonary disease, unspecified: Secondary | ICD-10-CM | POA: Diagnosis not present

## 2019-09-28 ENCOUNTER — Ambulatory Visit: Payer: PPO | Attending: Internal Medicine

## 2019-09-28 DIAGNOSIS — Z23 Encounter for immunization: Secondary | ICD-10-CM | POA: Insufficient documentation

## 2019-09-28 NOTE — Progress Notes (Signed)
   Covid-19 Vaccination Clinic  Name:  Crystal Day    MRN: [id number] DOB: 1935-07-18  09/28/2019  Crystal Day was observed post Covid-19 immunization for 15 minutes without incidence. She was provided with Vaccine Information Sheet and instruction to access the V-Safe system.   Crystal Day was instructed to call 911 with any severe reactions post vaccine: Marland Kitchen Difficulty breathing  . Swelling of your face and throat  . A fast heartbeat  . A bad rash all over your body  . Dizziness and weakness    Immunizations Administered    Name Date Dose VIS Date Route   Pfizer COVID-19 Vaccine 09/28/2019  3:49 PM 0.3 mL 07/28/2019 Intramuscular   Manufacturer: [place]   Lot: [id number]   [place]: [id number]

## 2019-10-03 DIAGNOSIS — J4530 Mild persistent asthma, uncomplicated: Secondary | ICD-10-CM | POA: Diagnosis not present

## 2019-11-13 DIAGNOSIS — I63212 Cerebral infarction due to unspecified occlusion or stenosis of left vertebral arteries: Secondary | ICD-10-CM | POA: Diagnosis not present

## 2019-11-13 DIAGNOSIS — I214 Non-ST elevation (NSTEMI) myocardial infarction: Secondary | ICD-10-CM | POA: Diagnosis not present

## 2019-11-13 DIAGNOSIS — E782 Mixed hyperlipidemia: Secondary | ICD-10-CM | POA: Diagnosis not present

## 2019-11-13 DIAGNOSIS — I6350 Cerebral infarction due to unspecified occlusion or stenosis of unspecified cerebral artery: Secondary | ICD-10-CM | POA: Diagnosis not present

## 2019-11-13 DIAGNOSIS — I10 Essential (primary) hypertension: Secondary | ICD-10-CM | POA: Diagnosis not present

## 2019-11-13 DIAGNOSIS — E7800 Pure hypercholesterolemia, unspecified: Secondary | ICD-10-CM | POA: Diagnosis not present

## 2019-11-13 DIAGNOSIS — J449 Chronic obstructive pulmonary disease, unspecified: Secondary | ICD-10-CM | POA: Diagnosis not present

## 2019-11-13 DIAGNOSIS — I6789 Other cerebrovascular disease: Secondary | ICD-10-CM | POA: Diagnosis not present

## 2019-11-13 DIAGNOSIS — I2510 Atherosclerotic heart disease of native coronary artery without angina pectoris: Secondary | ICD-10-CM | POA: Diagnosis not present

## 2019-11-13 DIAGNOSIS — J4530 Mild persistent asthma, uncomplicated: Secondary | ICD-10-CM | POA: Diagnosis not present

## 2019-12-07 ENCOUNTER — Other Ambulatory Visit: Payer: Self-pay

## 2019-12-07 ENCOUNTER — Encounter: Payer: Self-pay | Admitting: Adult Health

## 2019-12-07 ENCOUNTER — Ambulatory Visit: Payer: PPO | Admitting: Adult Health

## 2019-12-07 VITALS — BP 164/96 | HR 68 | Temp 97.0°F | Ht 69.0 in | Wt 151.0 lb

## 2019-12-07 DIAGNOSIS — Z789 Other specified health status: Secondary | ICD-10-CM | POA: Diagnosis not present

## 2019-12-07 DIAGNOSIS — I639 Cerebral infarction, unspecified: Secondary | ICD-10-CM | POA: Diagnosis not present

## 2019-12-07 DIAGNOSIS — I10 Essential (primary) hypertension: Secondary | ICD-10-CM

## 2019-12-07 DIAGNOSIS — E785 Hyperlipidemia, unspecified: Secondary | ICD-10-CM

## 2019-12-07 MED ORDER — EZETIMIBE 10 MG PO TABS: 10.0000 mg | ORAL_TABLET | Freq: Every day | ORAL | 3 refills | Status: DC

## 2019-12-07 MED ORDER — ASPIRIN EC 81 MG PO TBEC: 81.0000 mg | DELAYED_RELEASE_TABLET | Freq: Every day | ORAL | 3 refills | Status: DC

## 2019-12-07 MED ORDER — ASPIRIN EC 81 MG PO TBEC: 81.0000 mg | DELAYED_RELEASE_TABLET | Freq: Every day | ORAL | Status: DC

## 2019-12-07 NOTE — Patient Instructions (Signed)
Start aspirin 81 mg daily  and Zetia 10mg  daily  for secondary stroke prevention  At your next follow up with your PCP, please have lab work repeating including cholesterol levels - please have office fax results once done. If zetia does not control your cholesterol levels well, would recommend considering starting Repatha or Praluent   Continue to follow up with PCP regarding cholesterol and blood pressure management   Continue to monitor blood pressure at home  Maintain strict control of hypertension with blood pressure goal below 130/90, diabetes with hemoglobin A1c goal below 6.5% and cholesterol with LDL cholesterol (bad cholesterol) goal below 70 mg/dL. I also advised the patient to eat a healthy diet with plenty of whole grains, cereals, fruits and vegetables, exercise regularly and maintain ideal body weight.  Followup in the future with me in 4 months or call earlier if needed       Thank you for coming to see [place] at [facility]. I hope we have been able to provide you high quality care today.  You may receive a patient satisfaction survey over the next few weeks. We would appreciate your feedback and comments so that we may continue to improve ourselves and the health of our patients.

## 2019-12-07 NOTE — Progress Notes (Signed)
[facility] [address] [employer]. [place] [address] [phone]       STROKE FOLLOW UP NOTE  [name] Date of Birth:  14-Jan-1935 Medical Record Number:  [id number]   Reason for Referral: stroke follow up    CHIEF COMPLAINT:  Chief Complaint  Patient presents with  . Follow-up    hx of stroke, rm 9, alone , Pt states she feels great    HPI:   Today, 12/07/2019, [name] is being seen for stroke follow-up.  She has been stable from a stroke standpoint without residual deficits or new/recurring stroke/TIA symptoms. She self discontinued plavix and atorvastatin due to side effects and "making my whole body hurt".  She does have history of statin intolerance recently with atorvastatin and previously with pravastatin and Crestor.  Blood pressure today elevated with patient reporting whitecoat syndrome but does monitor at home and typically 130s/80s.  No concerns at this time.     History provided for reference purposes only Initial visit 06/08/2019: [name] is being seen today for hospital follow-up.  She has been doing well from a stroke standpoint without residual deficits. She has returned back to all prior activities without difficulty.  Completed 3 weeks DAPT and continues on plavix alone without bleeding or bruising.  Continues on atorvastatin without myalgias.  Blood pressure today 146/79.  Denies any worsening stroke/TIA symptoms  Stroke admission 04/26/2019: [name] is a 85 y.o. 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 Stroke work up showed right centrum semiovale infarct as evidenced on MRI secondary to moderately advanced small vessel disease. CTA head/neck  without LVO or significant stenosis and incidental finding 10mm L thyroid nodule.  2D echo showed normal EF without cardiac source of embolus identified.  LDL 190.  A1c 5.7.  Recommended DAPT for 3 weeks and aspirin alone as previously not on antithrombotics.  Prior stroke history in 02/2016 previously on Plavix and pravastatin but discontinued for unknown reasons.  HTN stable.  Initiated atorvastatin 80 mg daily for HLD management.  No evidence or history of DM.  Other stroke risk factors include advanced age, former tobacco use, family history of stroke and CAD status post  NSTEMI.  Other active problems include CKD and thyroid nodule and recommend follow-up with PCP.  No therapy needs which discharged home in stable condition on 04/27/2019.  Stroke:   R centrum semiovale infarct secondary to small vessel disease source  CT head No acute abnormality. Chronic sinusitis.   MRI  Small posterior R centrum semiovale infarct. Moderately advanced small vessel disease.   CTA head & neck no LVO. No significant stenosis. 94mm L thyroid nodule  2D Echo EF 60-65%. No source of embolus   LDL 190  HgbA1c 5.7  Lovenox 40 mg sq daily for VTE prophylaxis  No antithrombotic prior to admission, now on aspirin 81 mg daily and clopidogrel 75 mg daily. Continue DAPT x 3 weeks then ASPIRIN alone.    Therapy recommendations:  No therapy needs  Disposition:  Return home    ROS:   14 system review of systems performed and negative with exception of no complaints  PMH:  Past Medical History:  Diagnosis Date  . Anginal pain ([place]) since 2014  . Arthritis  just in the hands  . Cancer ([place]) 1988 or 89   basal cell carcinoma on face s/p Mohs procedure  . Complication of anesthesia    nausea  . History of wheezing    in spring and fall  . Hyperlipidemia   . Hypertension   . Non-obstructive CAD    a. 09/2013 Cath/NSTEMI: nonobs dzs.  . Non-STEMI (non-ST elevated myocardial infarction) ([place])    a. 09/2013  - peak trop 5.38;  b. 09/2013 Cath: nonobs dzs-->Med Rx.  . Pneumonia    "in my 20's"  . PONV (postoperative nausea and vomiting)   . Stroke [facility]) 2009   a. 09/2015 L Thalamic Lacunar infarct, presumed to be 2/2 small vessel dzs, residuals include numbness on right side of lips and right finger tips; b. 09/2015 Echo: nl LV fxn; c. 09/2015 Carotid U/S: mild bilat ICA stenosis.    PSH:  Past Surgical History:  Procedure Laterality Date  . ABDOMINAL HYSTERECTOMY  1974  . BREAST SURGERY Left 1976   lumpectomy  . EYE SURGERY     cataract surgery, both eyes  . FRACTURE SURGERY Right 1971   thumb and index finger  . LEFT HEART CATHETERIZATION WITH CORONARY ANGIOGRAM N/A 09/29/2013   Procedure: LEFT HEART CATHETERIZATION WITH CORONARY ANGIOGRAM;  Surgeon: Peter M Martinique, MD;  Location: [facility] CATH LAB;  Service: Cardiovascular;  Laterality: N/A;  . PARTIAL KNEE ARTHROPLASTY Right 12/31/2014   Procedure: RIGHT KNEE MEDIAL UNICOMPARTMENTAL ARTHROPLASTY;  Surgeon: Gaynelle Arabian, MD;  Location: [facility] ORS;  Service: Orthopedics;  Laterality: Right;    Social History:  Social History   Socioeconomic History  . Marital status: Widowed    Spouse name: Not on file  . Number of children: Not on file  . Years of education: Not on file  . Highest education level: Not on file  Occupational History  . Not on file  Tobacco Use  . Smoking status: Former Smoker    Packs/day: 0.25    Years: 5.00    Pack years: 1.25    Quit date: 08/17/1981    Years since quitting: 38.3  . Smokeless tobacco: Never Used  Substance and Sexual Activity  . Alcohol use: No  . Drug use: No  . Sexual activity: Not on file  Other Topics Concern  . Not on file  Social History Narrative  . Not on file   Social Determinants of Health   Financial Resource Strain:   . Difficulty of Paying Living Expenses:   Food Insecurity:   . Worried About Charity fundraiser in the Last Year:   . Arboriculturist in the Last Year:    Transportation Needs:   . Film/video editor (Medical):   Marland Kitchen Lack of Transportation (Non-Medical):   Physical Activity:   . Days of Exercise per Week:   . Minutes of Exercise per Session:   Stress:   . Feeling of Stress :   Social Connections:   . Frequency of Communication with Friends and Family:   . Frequency of Social Gatherings with Friends and Family:   . Attends Religious Services:   . Active Member of Clubs or Organizations:   . Attends Archivist Meetings:   Marland Kitchen Marital Status:   Intimate Partner Violence:   . Fear of Current or Ex-Partner:   . Emotionally Abused:   Marland Kitchen Physically Abused:   . Sexually Abused:     Family History:  Family History  Problem Relation Age of Onset  .  Heart attack Father 44  . Cancer Mother   . Stroke Brother   . Stroke Paternal Grandmother   . Heart attack Maternal Grandfather     Medications:   Current Outpatient Medications on File Prior to Visit  Medication Sig Dispense Refill  . albuterol (PROVENTIL) (2.5 MG/3ML) 0.083% nebulizer solution Take 2.5 mg by nebulization every 6 (six) hours as needed for wheezing or shortness of breath.    . cephALEXin (KEFLEX) 500 MG capsule Take 500 mg by mouth 4 (four) times daily.    . hydrochlorothiazide (HYDRODIURIL) 25 MG tablet Take 25 mg by mouth daily.     . metoprolol tartrate (LOPRESSOR) 25 MG tablet Take 25 mg by mouth 2 (two) times daily.    . nitroGLYCERIN (NITROSTAT) 0.4 MG SL tablet Place 1 tablet (0.4 mg total) under the tongue every 5 (five) minutes as needed for chest pain. 25 tablet 3   No current facility-administered medications on file prior to visit.    Allergies:   Allergies  Allergen Reactions  . Penicillins Hives  . Tetracyclines & Related Nausea Only  . Elavil [Amitriptyline] Rash  . Erythromycin Rash  . Macrodantin [Nitrofurantoin Macrocrystal] Rash     Vitals  Vitals:   12/07/19 1106  BP: (!) 164/96  Pulse: 68  Temp: (!) 97 F (36.1 C)  Weight:  151 lb (68.5 kg)  Height: 5\' 9"  (1.753 m)   Body mass index is 22.3 kg/m. No exam data present  Physical exam.  General: well developed, well nourished, pleasant elderly Caucasian female, seated, in no evident distress Head: head normocephalic and atraumatic.   Neck: supple with no carotid or supraclavicular bruits Cardiovascular: regular rate and rhythm, no murmurs Musculoskeletal: no deformity Skin:  no rash/petichiae Vascular:  Normal pulses all extremities   Neurologic Exam Mental Status: Awake and fully alert.   Normal speech and language.  Oriented to place and time. Recent and remote memory intact. Attention span, concentration and fund of knowledge appropriate. Mood and affect appropriate.  Cranial Nerves: Pupils equal, briskly reactive to light. Extraocular movements full without nystagmus. Visual fields full to confrontation. Hearing intact. Facial sensation intact. Face, tongue, palate moves normally and symmetrically.  Motor: Normal bulk and tone. Normal strength in all tested extremity muscles. Sensory.: intact to touch , pinprick , position and vibratory sensation.  Coordination: Rapid alternating movements normal in all extremities. Finger-to-nose and heel-to-shin performed accurately bilaterally. Gait and Station: Arises from chair without difficulty. Stance is normal. Gait demonstrates normal stride length and balance Reflexes: 1+ and symmetric. Toes downgoing.       ASSESSMENT: Crystal Day is a 85 y.o. year old female presented with left arm weakness and decreased sensation x2 days on 04/26/2019 with stroke work-up revealing right centrum semiovale infarct secondary to moderately advanced small vessel disease. Vascular risk factors include prior thalamic and remote bilateral thalamic and cerebellar lacunar infarcts, severe right P1 stenosis, NSTEMI, CAD, HTN, HLD and basal cell carcinoma. Recovered well from a stroke standpoint without residual deficits    PLAN:   1. Right centrum semiovale stroke :  -start aspirin 81 mg daily  and Zetia 10 mg daily for secondary stroke prevention.   -maintain strict control of hypertension with blood pressure goal below 130/90, diabetes with hemoglobin A1c goal below 6.5% and cholesterol with LDL cholesterol (bad cholesterol) goal below 70 mg/dL.  I also advised the patient to eat a healthy diet with plenty of whole grains, cereals, fruits and vegetables, exercise regularly with  at least 30 minutes of continuous activity daily and maintain ideal body weight. 2. HTN: Elevated today but monitors at home and typically stable.  Advised to continue to follow-up with PCP for monitoring and management 3. HLD: Initiate Zetia 10 mg daily with history of statin intolerance.  Plans on following up with PCP for repeat lab work and if LDL remains elevated, would recommend consideration of PCSK9 inhibitor    Follow up in 4 months or call earlier if needed   I spent 26 minutes of face-to-face and non-face-to-face time with patient.  This included previsit chart review, lab review, study review, order entry, electronic health record documentation, patient education regarding prior stroke, importance of medication compliance, importance of managing stroke risk factors and answered all questions to patient satisfaction     Frann Rider, [facility]  [facility] [address] [place], [place] [address]  Phone [phone] Fax [phone] Note: This document was prepared with digital dictation and possible smart phrase technology. Any transcriptional errors that result from this process are unintentional.

## 2019-12-07 NOTE — Progress Notes (Signed)
I agree with the above plan 

## 2020-01-03 DIAGNOSIS — Z1231 Encounter for screening mammogram for malignant neoplasm of breast: Secondary | ICD-10-CM | POA: Diagnosis not present

## 2020-01-03 DIAGNOSIS — M810 Age-related osteoporosis without current pathological fracture: Secondary | ICD-10-CM | POA: Diagnosis not present

## 2020-01-03 DIAGNOSIS — M85852 Other specified disorders of bone density and structure, left thigh: Secondary | ICD-10-CM | POA: Diagnosis not present

## 2020-01-11 DIAGNOSIS — J4530 Mild persistent asthma, uncomplicated: Secondary | ICD-10-CM | POA: Diagnosis not present

## 2020-01-11 DIAGNOSIS — E782 Mixed hyperlipidemia: Secondary | ICD-10-CM | POA: Diagnosis not present

## 2020-01-11 DIAGNOSIS — I63212 Cerebral infarction due to unspecified occlusion or stenosis of left vertebral arteries: Secondary | ICD-10-CM | POA: Diagnosis not present

## 2020-01-11 DIAGNOSIS — I10 Essential (primary) hypertension: Secondary | ICD-10-CM | POA: Diagnosis not present

## 2020-01-11 DIAGNOSIS — J449 Chronic obstructive pulmonary disease, unspecified: Secondary | ICD-10-CM | POA: Diagnosis not present

## 2020-01-11 DIAGNOSIS — I6789 Other cerebrovascular disease: Secondary | ICD-10-CM | POA: Diagnosis not present

## 2020-01-11 DIAGNOSIS — I214 Non-ST elevation (NSTEMI) myocardial infarction: Secondary | ICD-10-CM | POA: Diagnosis not present

## 2020-01-11 DIAGNOSIS — I2510 Atherosclerotic heart disease of native coronary artery without angina pectoris: Secondary | ICD-10-CM | POA: Diagnosis not present

## 2020-01-11 DIAGNOSIS — I6350 Cerebral infarction due to unspecified occlusion or stenosis of unspecified cerebral artery: Secondary | ICD-10-CM | POA: Diagnosis not present

## 2020-01-11 DIAGNOSIS — E7800 Pure hypercholesterolemia, unspecified: Secondary | ICD-10-CM | POA: Diagnosis not present

## 2020-02-13 DIAGNOSIS — J449 Chronic obstructive pulmonary disease, unspecified: Secondary | ICD-10-CM | POA: Diagnosis not present

## 2020-02-13 DIAGNOSIS — E782 Mixed hyperlipidemia: Secondary | ICD-10-CM | POA: Diagnosis not present

## 2020-02-13 DIAGNOSIS — I10 Essential (primary) hypertension: Secondary | ICD-10-CM | POA: Diagnosis not present

## 2020-02-13 DIAGNOSIS — I2510 Atherosclerotic heart disease of native coronary artery without angina pectoris: Secondary | ICD-10-CM | POA: Diagnosis not present

## 2020-02-14 DIAGNOSIS — I6789 Other cerebrovascular disease: Secondary | ICD-10-CM | POA: Diagnosis not present

## 2020-02-14 DIAGNOSIS — I214 Non-ST elevation (NSTEMI) myocardial infarction: Secondary | ICD-10-CM | POA: Diagnosis not present

## 2020-02-14 DIAGNOSIS — I63212 Cerebral infarction due to unspecified occlusion or stenosis of left vertebral arteries: Secondary | ICD-10-CM | POA: Diagnosis not present

## 2020-02-14 DIAGNOSIS — J4530 Mild persistent asthma, uncomplicated: Secondary | ICD-10-CM | POA: Diagnosis not present

## 2020-02-14 DIAGNOSIS — I2510 Atherosclerotic heart disease of native coronary artery without angina pectoris: Secondary | ICD-10-CM | POA: Diagnosis not present

## 2020-02-14 DIAGNOSIS — J449 Chronic obstructive pulmonary disease, unspecified: Secondary | ICD-10-CM | POA: Diagnosis not present

## 2020-02-14 DIAGNOSIS — E782 Mixed hyperlipidemia: Secondary | ICD-10-CM | POA: Diagnosis not present

## 2020-02-14 DIAGNOSIS — I10 Essential (primary) hypertension: Secondary | ICD-10-CM | POA: Diagnosis not present

## 2020-02-14 DIAGNOSIS — E7800 Pure hypercholesterolemia, unspecified: Secondary | ICD-10-CM | POA: Diagnosis not present

## 2020-02-14 DIAGNOSIS — I6350 Cerebral infarction due to unspecified occlusion or stenosis of unspecified cerebral artery: Secondary | ICD-10-CM | POA: Diagnosis not present

## 2020-03-14 DIAGNOSIS — I2510 Atherosclerotic heart disease of native coronary artery without angina pectoris: Secondary | ICD-10-CM | POA: Diagnosis not present

## 2020-03-14 DIAGNOSIS — I6350 Cerebral infarction due to unspecified occlusion or stenosis of unspecified cerebral artery: Secondary | ICD-10-CM | POA: Diagnosis not present

## 2020-03-14 DIAGNOSIS — J449 Chronic obstructive pulmonary disease, unspecified: Secondary | ICD-10-CM | POA: Diagnosis not present

## 2020-03-14 DIAGNOSIS — I214 Non-ST elevation (NSTEMI) myocardial infarction: Secondary | ICD-10-CM | POA: Diagnosis not present

## 2020-03-14 DIAGNOSIS — J4530 Mild persistent asthma, uncomplicated: Secondary | ICD-10-CM | POA: Diagnosis not present

## 2020-03-14 DIAGNOSIS — I10 Essential (primary) hypertension: Secondary | ICD-10-CM | POA: Diagnosis not present

## 2020-03-14 DIAGNOSIS — I63212 Cerebral infarction due to unspecified occlusion or stenosis of left vertebral arteries: Secondary | ICD-10-CM | POA: Diagnosis not present

## 2020-03-14 DIAGNOSIS — E7800 Pure hypercholesterolemia, unspecified: Secondary | ICD-10-CM | POA: Diagnosis not present

## 2020-03-14 DIAGNOSIS — E782 Mixed hyperlipidemia: Secondary | ICD-10-CM | POA: Diagnosis not present

## 2020-03-14 DIAGNOSIS — I6789 Other cerebrovascular disease: Secondary | ICD-10-CM | POA: Diagnosis not present

## 2020-04-08 ENCOUNTER — Other Ambulatory Visit: Payer: Self-pay

## 2020-04-08 ENCOUNTER — Ambulatory Visit: Payer: PPO | Admitting: Adult Health

## 2020-04-08 ENCOUNTER — Encounter: Payer: Self-pay | Admitting: Adult Health

## 2020-04-08 VITALS — BP 181/81 | HR 80 | Ht 69.0 in | Wt 152.6 lb

## 2020-04-08 DIAGNOSIS — I639 Cerebral infarction, unspecified: Secondary | ICD-10-CM | POA: Diagnosis not present

## 2020-04-08 DIAGNOSIS — E785 Hyperlipidemia, unspecified: Secondary | ICD-10-CM

## 2020-04-08 DIAGNOSIS — I10 Essential (primary) hypertension: Secondary | ICD-10-CM

## 2020-04-08 NOTE — Progress Notes (Signed)
[facility] [address] [place]. [place] [address] [phone]       STROKE FOLLOW UP NOTE  [name] Date of Birth:  1935/07/08 Medical Record Number:  [id number]   Reason for Referral: stroke follow up    CHIEF COMPLAINT:  Chief Complaint  Patient presents with  . Follow-up    4 month f/u, states she has been doing well   . room 5    alone     HPI:   Today, 04/08/2020, Crystal Day returns for stroke follow-up.  Stable since prior visit without residual stroke deficits and denies new stroke/TIA symptoms  Remains on aspirin 81 mg daily without bleeding or bruising Reports difficulty tolerating Zetia as well as prior intolerance to atorvastatin Plans on repeat lipid panel at follow-up visit in the near future with PCP Blood pressure today 181/81 -currently asymptomatic.  Reports whitecoat syndrome.  Monitors at home which has been stable.  No concerns at this time     History provided for reference purposes only Update 12/07/2019 JM: Crystal Day is being seen for stroke follow-up.  She has been stable from a stroke standpoint without residual deficits or new/recurring stroke/TIA symptoms. She self discontinued plavix and atorvastatin due to side effects and "making my whole body hurt".  She does have history of statin intolerance recently with atorvastatin and previously with pravastatin and Crestor.  Blood pressure today elevated with patient reporting whitecoat syndrome but does monitor at home and typically 130s/80s.  No concerns at this time.  Initial visit 06/08/2019: Crystal Day is being seen today for hospital follow-up.  She has been doing well from a stroke standpoint without residual deficits. She has returned back to all prior activities without difficulty.  Completed 3 weeks DAPT and continues on plavix alone without bleeding or bruising.  Continues on atorvastatin without myalgias.  Blood pressure today 146/79.  Denies any worsening  stroke/TIA symptoms  Stroke admission 04/26/2019: [name] is a 85 y.o. 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 Stroke work up showed right centrum semiovale infarct as evidenced on MRI secondary to moderately advanced small vessel disease. CTA head/neck without LVO or significant stenosis and incidental finding 30mm L thyroid nodule.  2D echo showed normal EF without cardiac source of embolus identified.  LDL 190.  A1c 5.7.  Recommended DAPT for 3 weeks and aspirin alone as previously not on antithrombotics.  Prior stroke history in 02/2016 previously on Plavix and pravastatin but discontinued for unknown reasons.  HTN stable.  Initiated atorvastatin 80 mg daily for HLD management.  No evidence or history of DM.  Other stroke risk factors include advanced age, former tobacco use, family history of stroke and CAD status post  NSTEMI.  Other active problems include CKD and thyroid nodule and recommend follow-up with PCP.  No therapy needs which discharged home in stable condition on 04/27/2019.  Stroke:   R centrum semiovale infarct secondary to small vessel disease source  CT head No acute abnormality. Chronic sinusitis.   MRI  Small posterior R centrum semiovale infarct. Moderately advanced small vessel disease.   CTA head & neck no LVO. No significant stenosis. 76mm L thyroid nodule  2D Echo EF 60-65%. No source of embolus   LDL  190  HgbA1c 5.7  Lovenox 40 mg sq daily for VTE prophylaxis  No antithrombotic prior to admission, now on aspirin 81 mg daily and clopidogrel 75 mg daily. Continue DAPT x 3 weeks then ASPIRIN alone.    Therapy recommendations:  No therapy needs  Disposition:  Return home    ROS:    14 system review of systems performed and negative with exception of no complaints  PMH:  Past Medical History:  Diagnosis Date  . Anginal pain ([place]) since 2014  . Arthritis    just in the hands  . Cancer ([place]) 1988 or 89   basal cell carcinoma on face s/p Mohs procedure  . Complication of anesthesia    nausea  . History of wheezing    in spring and fall  . Hyperlipidemia   . Hypertension   . Non-obstructive CAD    a. 09/2013 Cath/NSTEMI: nonobs dzs.  . Non-STEMI (non-ST elevated myocardial infarction) ([place])    a. 09/2013 - peak trop 5.38;  b. 09/2013 Cath: nonobs dzs-->Med Rx.  . Pneumonia    "in my 20's"  . PONV (postoperative nausea and vomiting)   . Stroke [facility]) 2009   a. 09/2015 L Thalamic Lacunar infarct, presumed to be 2/2 small vessel dzs, residuals include numbness on right side of lips and right finger tips; b. 09/2015 Echo: nl LV fxn; c. 09/2015 Carotid U/S: mild bilat ICA stenosis.    PSH:  Past Surgical History:  Procedure Laterality Date  . ABDOMINAL HYSTERECTOMY  1974  . BREAST SURGERY Left 1976   lumpectomy  . EYE SURGERY     cataract surgery, both eyes  . FRACTURE SURGERY Right 1971   thumb and index finger  . LEFT HEART CATHETERIZATION WITH CORONARY ANGIOGRAM N/A 09/29/2013   Procedure: LEFT HEART CATHETERIZATION WITH CORONARY ANGIOGRAM;  Surgeon: Peter M Martinique, MD;  Location: [facility] CATH LAB;  Service: Cardiovascular;  Laterality: N/A;  . PARTIAL KNEE ARTHROPLASTY Right 12/31/2014   Procedure: RIGHT KNEE MEDIAL UNICOMPARTMENTAL ARTHROPLASTY;  Surgeon: Gaynelle Arabian, MD;  Location: [facility] ORS;  Service: Orthopedics;  Laterality: Right;    Social History:  Social History   Socioeconomic History  . Marital status: Widowed    Spouse name: Not on file  . Number of children: Not on file  . Years of education: Not on file  . Highest education level: Not on file  Occupational History  . Not on file  Tobacco Use  . Smoking status: Former Smoker    Packs/day: 0.25     Years: 5.00    Pack years: 1.25    Quit date: 08/17/1981    Years since quitting: 38.6  . Smokeless tobacco: Never Used  Substance and Sexual Activity  . Alcohol use: No  . Drug use: No  . Sexual activity: Not on file  Other Topics Concern  . Not on file  Social History Narrative  . Not on file   Social Determinants of Health   Financial Resource Strain:   . Difficulty of Paying Living Expenses: Not on file  Food Insecurity:   . Worried About Charity fundraiser in the Last Year: Not on file  . Ran Out of Food in the Last Year: Not on file  Transportation Needs:   . Lack of Transportation (Medical): Not on file  . Lack of Transportation (Non-Medical): Not on file  Physical Activity:   . Days of Exercise per Week: Not on file  . Minutes of Exercise per Session: Not  on file  Stress:   . Feeling of Stress : Not on file  Social Connections:   . Frequency of Communication with Friends and Family: Not on file  . Frequency of Social Gatherings with Friends and Family: Not on file  . Attends Religious Services: Not on file  . Active Member of Clubs or Organizations: Not on file  . Attends Archivist Meetings: Not on file  . Marital Status: Not on file  Intimate Partner Violence:   . Fear of Current or Ex-Partner: Not on file  . Emotionally Abused: Not on file  . Physically Abused: Not on file  . Sexually Abused: Not on file    Family History:  Family History  Problem Relation Age of Onset  . Heart attack Father 59  . Cancer Mother   . Stroke Brother   . Stroke Paternal Grandmother   . Heart attack Maternal Grandfather     Medications:   Current Outpatient Medications on File Prior to Visit  Medication Sig Dispense Refill  . aspirin EC 81 MG tablet Take 1 tablet (81 mg total) by mouth daily. 90 tablet 3  . ezetimibe (ZETIA) 10 MG tablet Take 1 tablet (10 mg total) by mouth daily. 90 tablet 3  . hydrochlorothiazide (HYDRODIURIL) 25 MG tablet Take 25 mg by  mouth daily.     . metoprolol tartrate (LOPRESSOR) 25 MG tablet Take 25 mg by mouth 2 (two) times daily.    . nitroGLYCERIN (NITROSTAT) 0.4 MG SL tablet Place 1 tablet (0.4 mg total) under the tongue every 5 (five) minutes as needed for chest pain. 25 tablet 3   No current facility-administered medications on file prior to visit.    Allergies:   Allergies  Allergen Reactions  . Penicillins Hives  . Tetracyclines & Related Nausea Only  . Elavil [Amitriptyline] Rash  . Erythromycin Rash  . Macrodantin [Nitrofurantoin Macrocrystal] Rash     Vitals  Vitals:   04/08/20 1241  BP: (!) 181/81  Pulse: 80  Weight: 152 lb 9.6 oz (69.2 kg)  Height: 5\' 9"  (1.753 m)   Body mass index is 22.54 kg/m. No exam data present  Physical exam.  General: well developed, well nourished, pleasant elderly Caucasian female, seated, in no evident distress Head: head normocephalic and atraumatic.   Neck: supple with no carotid or supraclavicular bruits Cardiovascular: regular rate and rhythm, no murmurs Musculoskeletal: no deformity Skin:  no rash/petichiae Vascular:  Normal pulses all extremities   Neurologic Exam Mental Status: Awake and fully alert.   Fluent speech and language.  Oriented to place and time. Recent and remote memory intact. Attention span, concentration and fund of knowledge appropriate. Mood and affect appropriate.  Cranial Nerves: Pupils equal, briskly reactive to light. Extraocular movements full without nystagmus. Visual fields full to confrontation. Hearing intact. Facial sensation intact. Face, tongue, palate moves normally and symmetrically.  Motor: Normal bulk and tone. Normal strength in all tested extremity muscles. Sensory.: intact to touch , pinprick , position and vibratory sensation.  Coordination: Rapid alternating movements normal in all extremities. Finger-to-nose and heel-to-shin performed accurately bilaterally. Gait and Station: Arises from chair without  difficulty. Stance is normal. Gait demonstrates normal stride length and balance Reflexes: 1+ and symmetric. Toes downgoing.       ASSESSMENT: Crystal Day is a 85 y.o. year old female presented with left arm weakness and decreased sensation x2 days on 04/26/2019 with stroke work-up revealing right centrum semiovale infarct secondary to moderately advanced small  vessel disease. Vascular risk factors include prior thalamic and remote bilateral thalamic and cerebellar lacunar infarcts, severe right P1 stenosis, NSTEMI, CAD, HTN, HLD and basal cell carcinoma.     PLAN:  1. Right centrum semiovale stroke :  a. Recovered well without residual deficits b. Continue aspirin 81 mg daily for secondary stroke prevention.   c. Discussed importance of cholesterol-lowering medication for secondary stroke prevention.  Difficulty tolerating statins and reports recent difficulty tolerating Zetia.  Advised to follow-up with PCP to consider initiating PCSK9 inhibitor d. Ensure close PCP follow-up for aggressive stroke risk factor management 2. HTN:  a. BP goal<130/90.   b. Stable.   c. Managed by PCP. 3. HLD:  a. LDL goal <70.  b. Plan on repeat lipid panel in the near future PCP and advised of LDL above goal to initiate PCSK9 inhibitor due to prior intolerance to statins and Zetia    Overall stable from stroke standpoint recommend follow-up on an as-needed basis   I spent 20 minutes of face-to-face and non-face-to-face time with patient.  This included previsit chart review, lab review, study review, order entry, electronic health record documentation, patient education regarding prior stroke, importance of managing stroke risk factors and answered all questions to patient satisfaction   Frann Rider, [facility]  [facility] [address] [place],  [address]  Phone [phone] Fax [phone] Note: This document was prepared with digital dictation and  possible smart phrase technology. Any transcriptional errors that result from this process are unintentional.

## 2020-04-08 NOTE — Patient Instructions (Addendum)
Continue aspirin 81 mg daily for secondary stroke prevention  Recheck your cholesterol levels at follow-up visit with your PCP and if remains elevated, would recommend considering initiating PCSK9 inhibitor Repatha or Praluent which is a twice monthly injectable medication for management of your cholesterol levels.  This can be further discussed and managed by your PCP  Continue to follow up with PCP regarding cholesterol and blood pressure management  Maintain strict control of hypertension with blood pressure goal below 130/90 and cholesterol with LDL cholesterol (bad cholesterol) goal below 70 mg/dL.     Overall stable from stroke standpoint recommend follow-up on an as-needed basis       Thank you for coming to see [place] at [facility]. I hope we have been able to provide you high quality care today.  You may receive a patient satisfaction survey over the next few weeks. We would appreciate your feedback and comments so that we may continue to improve ourselves and the health of our patients.    Stroke Prevention Some medical conditions and behaviors are associated with a higher chance of having a stroke. You can help prevent a stroke by making nutrition, lifestyle, and other changes, including managing any medical conditions you may have. What nutrition changes can be made?   Eat healthy foods. You can do this by: ? Choosing foods high in fiber, such as fresh fruits and vegetables and whole grains. ? Eating at least 5 or more servings of fruits and vegetables a day. Try to fill half of your plate at each meal with fruits and vegetables. ? Choosing lean protein foods, such as lean cuts of meat, poultry without skin, fish, tofu, beans, and nuts. ? Eating low-fat dairy products. ? Avoiding foods that are high in salt (sodium). This can help lower blood pressure. ? Avoiding foods that have saturated fat, trans fat, and cholesterol. This can help prevent high  cholesterol. ? Avoiding processed and premade foods.  Follow your health care provider's specific guidelines for losing weight, controlling high blood pressure (hypertension), lowering high cholesterol, and managing diabetes. These may include: ? Reducing your daily calorie intake. ? Limiting your daily sodium intake to 1,500 milligrams (mg). ? Using only healthy fats for cooking, such as olive oil, canola oil, or sunflower oil. ? Counting your daily carbohydrate intake. What lifestyle changes can be made?  Maintain a healthy weight. Talk to your health care provider about your ideal weight.  Get at least 30 minutes of moderate physical activity at least 5 days a week. Moderate activity includes brisk walking, biking, and swimming.  Do not use any products that contain nicotine or tobacco, such as cigarettes and e-cigarettes. If you need help quitting, ask your health care provider. It may also be helpful to avoid exposure to secondhand smoke.  Limit alcohol intake to no more than 1 drink a day for nonpregnant women and 2 drinks a day for men. One drink equals 12 oz of beer, 5 oz of wine, or 1 oz of hard liquor.  Stop any illegal drug use.  Avoid taking birth control pills. Talk to your health care provider about the risks of taking birth control pills if: ? You are over 57 years old. ? You smoke. ? You get migraines. ? You have ever had a blood clot. What other changes can be made?  Manage your cholesterol levels. ? Eating a healthy diet is important for preventing high cholesterol. If cholesterol cannot be managed through diet alone, you may  also need to take medicines. ? Take any prescribed medicines to control your cholesterol as told by your health care provider.  Manage your diabetes. ? Eating a healthy diet and exercising regularly are important parts of managing your blood sugar. If your blood sugar cannot be managed through diet and exercise, you may need to take  medicines. ? Take any prescribed medicines to control your diabetes as told by your health care provider.  Control your hypertension. ? To reduce your risk of stroke, try to keep your blood pressure below 130/80. ? Eating a healthy diet and exercising regularly are an important part of controlling your blood pressure. If your blood pressure cannot be managed through diet and exercise, you may need to take medicines. ? Take any prescribed medicines to control hypertension as told by your health care provider. ? Ask your health care provider if you should monitor your blood pressure at home. ? Have your blood pressure checked every year, even if your blood pressure is normal. Blood pressure increases with age and some medical conditions.  Get evaluated for sleep disorders (sleep apnea). Talk to your health care provider about getting a sleep evaluation if you snore a lot or have excessive sleepiness.  Take over-the-counter and prescription medicines only as told by your health care provider. Aspirin or blood thinners (antiplatelets or anticoagulants) may be recommended to reduce your risk of forming blood clots that can lead to stroke.  Make sure that any other medical conditions you have, such as atrial fibrillation or atherosclerosis, are managed. What are the warning signs of a stroke? The warning signs of a stroke can be easily remembered as BEFAST.  B is for balance. Signs include: ? Dizziness. ? Loss of balance or coordination. ? Sudden trouble walking.  E is for eyes. Signs include: ? A sudden change in vision. ? Trouble seeing.  F is for face. Signs include: ? Sudden weakness or numbness of the face. ? The face or eyelid drooping to one side.  A is for arms. Signs include: ? Sudden weakness or numbness of the arm, usually on one side of the body.  S is for speech. Signs include: ? Trouble speaking (aphasia). ? Trouble understanding.  T is for time. ? These symptoms may  represent a serious problem that is an emergency. Do not wait to see if the symptoms will go away. Get medical help right away. Call your local emergency services (911 in the [place] Do not drive yourself to the hospital.  Other signs of stroke may include: ? A sudden, severe headache with no known cause. ? Nausea or vomiting. ? Seizure. Where to find more information For more information, visit:  American Stroke Association: www.strokeassociation.org  National Stroke Association: www.stroke.org Summary  You can prevent a stroke by eating healthy, exercising, not smoking, limiting alcohol intake, and managing any medical conditions you may have.  Do not use any products that contain nicotine or tobacco, such as cigarettes and e-cigarettes. If you need help quitting, ask your health care provider. It may also be helpful to avoid exposure to secondhand smoke.  Remember BEFAST for warning signs of stroke. Get help right away if you or a loved one has any of these signs. This information is not intended to replace advice given to you by your health care provider. Make sure you discuss any questions you have with your health care provider. Document Revised: 07/16/2017 Document Reviewed: 09/08/2016 Elsevier Patient Education  2020 [employer].

## 2020-04-16 NOTE — Progress Notes (Signed)
I agree with the above plan 

## 2020-04-17 DIAGNOSIS — E782 Mixed hyperlipidemia: Secondary | ICD-10-CM | POA: Diagnosis not present

## 2020-05-16 DIAGNOSIS — J4530 Mild persistent asthma, uncomplicated: Secondary | ICD-10-CM | POA: Diagnosis not present

## 2020-05-16 DIAGNOSIS — I214 Non-ST elevation (NSTEMI) myocardial infarction: Secondary | ICD-10-CM | POA: Diagnosis not present

## 2020-05-16 DIAGNOSIS — J449 Chronic obstructive pulmonary disease, unspecified: Secondary | ICD-10-CM | POA: Diagnosis not present

## 2020-05-16 DIAGNOSIS — I10 Essential (primary) hypertension: Secondary | ICD-10-CM | POA: Diagnosis not present

## 2020-05-16 DIAGNOSIS — I6350 Cerebral infarction due to unspecified occlusion or stenosis of unspecified cerebral artery: Secondary | ICD-10-CM | POA: Diagnosis not present

## 2020-05-16 DIAGNOSIS — E7800 Pure hypercholesterolemia, unspecified: Secondary | ICD-10-CM | POA: Diagnosis not present

## 2020-05-16 DIAGNOSIS — I2510 Atherosclerotic heart disease of native coronary artery without angina pectoris: Secondary | ICD-10-CM | POA: Diagnosis not present

## 2020-05-16 DIAGNOSIS — E782 Mixed hyperlipidemia: Secondary | ICD-10-CM | POA: Diagnosis not present

## 2020-05-16 DIAGNOSIS — I63212 Cerebral infarction due to unspecified occlusion or stenosis of left vertebral arteries: Secondary | ICD-10-CM | POA: Diagnosis not present

## 2020-05-16 DIAGNOSIS — I6789 Other cerebrovascular disease: Secondary | ICD-10-CM | POA: Diagnosis not present

## 2020-06-13 ENCOUNTER — Ambulatory Visit: Payer: PPO

## 2020-06-15 ENCOUNTER — Ambulatory Visit: Payer: Self-pay | Attending: Internal Medicine

## 2020-06-15 ENCOUNTER — Ambulatory Visit: Payer: PPO

## 2020-06-15 DIAGNOSIS — Z23 Encounter for immunization: Secondary | ICD-10-CM

## 2020-06-15 NOTE — Progress Notes (Signed)
   Covid-19 Vaccination Clinic  Name:  Crystal Day    MRN: [id number] DOB: Dec 09, 1934  06/15/2020  Crystal Day was observed post Covid-19 immunization for 15 minutes without incident. She was provided with Vaccine Information Sheet and instruction to access the V-Safe system.   Crystal Day was instructed to call 911 with any severe reactions post vaccine: Marland Kitchen Difficulty breathing  . Swelling of face and throat  . A fast heartbeat  . A bad rash all over body  . Dizziness and weakness

## 2020-08-20 DIAGNOSIS — I10 Essential (primary) hypertension: Secondary | ICD-10-CM | POA: Diagnosis not present

## 2020-08-20 DIAGNOSIS — Z9181 History of falling: Secondary | ICD-10-CM | POA: Diagnosis not present

## 2020-08-20 DIAGNOSIS — R413 Other amnesia: Secondary | ICD-10-CM | POA: Diagnosis not present

## 2020-08-23 ENCOUNTER — Other Ambulatory Visit: Payer: Self-pay | Admitting: Family Medicine

## 2020-08-23 ENCOUNTER — Other Ambulatory Visit: Payer: Self-pay

## 2020-08-23 DIAGNOSIS — R413 Other amnesia: Secondary | ICD-10-CM

## 2020-08-30 ENCOUNTER — Ambulatory Visit
Admission: RE | Admit: 2020-08-30 | Discharge: 2020-08-30 | Disposition: A | Discharge status: Home / Self Care | Payer: PPO | Source: Ambulatory Visit | Attending: Family Medicine | Admitting: Family Medicine

## 2020-08-30 ENCOUNTER — Other Ambulatory Visit: Payer: Self-pay

## 2020-08-30 DIAGNOSIS — I639 Cerebral infarction, unspecified: Secondary | ICD-10-CM | POA: Diagnosis not present

## 2020-08-30 DIAGNOSIS — R413 Other amnesia: Secondary | ICD-10-CM | POA: Diagnosis not present

## 2020-08-30 DIAGNOSIS — G9389 Other specified disorders of brain: Secondary | ICD-10-CM | POA: Diagnosis not present

## 2020-08-30 DIAGNOSIS — I6782 Cerebral ischemia: Secondary | ICD-10-CM | POA: Diagnosis not present

## 2020-09-09 ENCOUNTER — Other Ambulatory Visit: Payer: PPO

## 2020-09-16 DIAGNOSIS — R413 Other amnesia: Secondary | ICD-10-CM | POA: Diagnosis not present

## 2020-09-16 DIAGNOSIS — R4701 Aphasia: Secondary | ICD-10-CM | POA: Diagnosis not present

## 2020-09-16 DIAGNOSIS — I10 Essential (primary) hypertension: Secondary | ICD-10-CM | POA: Diagnosis not present

## 2020-09-24 ENCOUNTER — Ambulatory Visit: Payer: PPO | Admitting: Neurology

## 2020-09-24 ENCOUNTER — Encounter: Payer: Self-pay | Admitting: Neurology

## 2020-09-24 ENCOUNTER — Telehealth: Payer: Self-pay | Admitting: Neurology

## 2020-09-24 VITALS — BP 226/108 | HR 96 | Ht 69.0 in | Wt 143.0 lb

## 2020-09-24 DIAGNOSIS — R482 Apraxia: Secondary | ICD-10-CM | POA: Diagnosis not present

## 2020-09-24 DIAGNOSIS — R4701 Aphasia: Secondary | ICD-10-CM | POA: Diagnosis not present

## 2020-09-24 DIAGNOSIS — E559 Vitamin D deficiency, unspecified: Secondary | ICD-10-CM | POA: Diagnosis not present

## 2020-09-24 DIAGNOSIS — R413 Other amnesia: Secondary | ICD-10-CM | POA: Diagnosis not present

## 2020-09-24 NOTE — Telephone Encounter (Signed)
health team order sent to GI. No auth they will reach out to the patient to schedule.  

## 2020-09-24 NOTE — Progress Notes (Signed)
[facility]  PATIENT: Crystal Day DOB: [birth year]  REFERRING DOCTOR OR PCP: Carol Ada, MD SOURCE: Patient, daughter, lab tests, MRI results, MRI images personally reviewed  _________________________________   HISTORICAL  CHIEF COMPLAINT:  Chief Complaint  Patient presents with  . New Patient (Initial Visit)    RM 13. Paper referral from Carol Ada, MD (PCP) for worsening memory. Daughter- Crystal Day.    HISTORY OF PRESENT ILLNESS:  I had the pleasure of seeing your patient, Crystal Day, at [facility] for neurologic consultation regarding her memory changes.  She feels she has only had memory problems for a few weeks.    Her daughter first noted that Crystal Day had memory problems that developed fairly suddenly the last week of December 2021.   She was driving up to [place] and was completely independent before that time.    She has done no driving, cooking or shopping the last 5 -6 weeks.    There has been no numbness, weakness, gait change or ataxia.   She did have a few days where she seemed tohave more trouble coming up with the right words and sh has garbled up some phrases and sometimes needs to think about getting a word out.     She had a stroke a couple years ago and had more trouble with numbers and codes (bank, phone..  This improved.     She has no recent change in her sleep.  She was a little sad when her son left at Christmas to go back home but nothing unusual.    She has been les active over the winter.     She has hypertension and coronary artery disease but she took herself off all of her medications.     I personally reviewed the following images:  MRI of the brain from Aug 30, 2020.  It shows mild generalized cortical atrophy that is most pronounced in the medial temporal lobes.  Additionally, she has lacunar infarctions in the cerebellar hemispheres and bilateral thalamus.  There are moderate chronic microvascular  ischemic changes in the hemispheres.  None of the ischemic findings were acute.    MRI from 08/30/2020 to the MRI from 04/26/2019, there were no new findings.  In 2020, there was a small lacunar infarction in the right centrum semiovale.  Atrophy is slightly more noted.  MRI of the brain 10/03/2015 showed a small acute left anteromedial thalamic lacunar infarction.  Compared to the 2007 MRI, there are additional chronic ischemic findings in the cerebellum, right thalamus and hemispheres  MRI of the brain 11/21/2005 showed a small acute left thalamic infarct and a small acute right centrum semiovale infarction    [facility] Cognitive Assessment  09/24/2020  Visuospatial/ Executive (0/5) 0  Naming (0/3) 3  Attention: Read list of digits (0/2) 1  Attention: Read list of letters (0/1) 0  Attention: Serial 7 subtraction starting at 100 (0/3) 0  Language: Repeat phrase (0/2) 1  Language : Fluency (0/1) 1  Abstraction (0/2) 0  Delayed Recall (0/5) 0  Orientation (0/6) 3  Total 9  Adjusted Score (based on education) 10      REVIEW OF SYSTEMS: Constitutional: No fevers, chills, sweats, or change in appetite Eyes: No visual changes, double vision, eye pain Ear, nose and throat: No hearing loss, ear pain, nasal congestion, sore throat Cardiovascular: No chest pain, palpitations Respiratory: No shortness of breath at rest or with exertion.   No wheezes GastrointestinaI: No nausea, vomiting, diarrhea, abdominal  pain, fecal incontinence Genitourinary: No dysuria, urinary retention or frequency.  No nocturia. Musculoskeletal: No neck pain, back pain Integumentary: No rash, pruritus, skin lesions Neurological: as above Psychiatric: No depression at this time.  No anxiety Endocrine: No palpitations, diaphoresis, change in appetite, change in weigh or increased thirst Hematologic/Lymphatic: No anemia, purpura, petechiae. Allergic/Immunologic: No itchy/runny eyes, nasal congestion, recent allergic  reactions, rashes  ALLERGIES: Allergies  Allergen Reactions  . Penicillins Hives  . Tetracyclines & Related Nausea Only  . Elavil [Amitriptyline] Rash  . Erythromycin Rash  . Macrodantin [Nitrofurantoin Macrocrystal] Rash    HOME MEDICATIONS:  Current Outpatient Medications:  .  aspirin EC 81 MG tablet, Take 1 tablet (81 mg total) by mouth daily., Disp: 90 tablet, Rfl: 3 .  hydrochlorothiazide (HYDRODIURIL) 25 MG tablet, Take 25 mg by mouth daily. , Disp: , Rfl:  .  metoprolol tartrate (LOPRESSOR) 25 MG tablet, Take 25 mg by mouth 2 (two) times daily., Disp: , Rfl:  .  nitroGLYCERIN (NITROSTAT) 0.4 MG SL tablet, Place 1 tablet (0.4 mg total) under the tongue every 5 (five) minutes as needed for chest pain., Disp: 25 tablet, Rfl: 3  PAST MEDICAL HISTORY: Past Medical History:  Diagnosis Date  . Anginal pain ([place]) since 2014  . Arthritis    just in the hands  . Cancer ([place]) 1988 or 89   basal cell carcinoma on face s/p Mohs procedure  . Complication of anesthesia    nausea  . History of wheezing    in spring and fall  . Hyperlipidemia   . Hypertension   . Non-obstructive CAD    a. 09/2013 Cath/NSTEMI: nonobs dzs.  . Non-STEMI (non-ST elevated myocardial infarction) ([place])    a. 09/2013 - peak trop 5.38;  b. 09/2013 Cath: nonobs dzs-->Med Rx.  . Pneumonia    "in my 20's"  . PONV (postoperative nausea and vomiting)   . Stroke [facility]) 2009   a. 09/2015 L Thalamic Lacunar infarct, presumed to be 2/2 small vessel dzs, residuals include numbness on right side of lips and right finger tips; b. 09/2015 Echo: nl LV fxn; c. 09/2015 Carotid U/S: mild bilat ICA stenosis.    PAST SURGICAL HISTORY: Past Surgical History:  Procedure Laterality Date  . ABDOMINAL HYSTERECTOMY  1974  . BREAST SURGERY Left 1976   lumpectomy  . EYE SURGERY     cataract surgery, both eyes  . FRACTURE SURGERY Right 1971   thumb and index finger  . LEFT HEART CATHETERIZATION WITH CORONARY ANGIOGRAM N/A 09/29/2013    Procedure: LEFT HEART CATHETERIZATION WITH CORONARY ANGIOGRAM;  Surgeon: Peter M Martinique, MD;  Location: [facility] C[facility] LAB;  Service: Cardiovascular;  Laterality: N/A;  . PARTIAL KNEE ARTHROPLASTY Right 12/31/2014   Procedure: RIGHT KNEE MEDIAL UNICOMPARTMENTAL ARTHROPLASTY;  Surgeon: Gaynelle Arabian, MD;  Location: [facility] ORS;  Service: Orthopedics;  Laterality: Right;    FAMILY HISTORY: Family History  Problem Relation Age of Onset  . Heart attack Father 73  . Cancer Mother   . Stroke Brother   . Stroke Paternal Grandmother   . Heart attack Maternal Grandfather     SOCIAL HISTORY:  Social History   Socioeconomic History  . Marital status: Widowed    Spouse name: Not on file  . Number of children: Not on file  . Years of education: Not on file  . Highest education level: Not on file  Occupational History  . Not on file  Tobacco Use  . Smoking status: Former Smoker  Packs/day: 0.25    Years: 5.00    Pack years: 1.25    Quit date: 08/17/1981    Years since quitting: 39.1  . Smokeless tobacco: Never Used  Substance and Sexual Activity  . Alcohol use: No  . Drug use: No  . Sexual activity: Not on file  Other Topics Concern  . Not on file  Social History Narrative  . Not on file   Social Determinants of Health   Financial Resource Strain: Not on file  Food Insecurity: Not on file  Transportation Needs: Not on file  Physical Activity: Not on file  Stress: Not on file  Social Connections: Not on file  Intimate Partner Violence: Not on file     PHYSICAL EXAM  Vitals:   09/24/20 1112  BP: (!) 226/108  Pulse: 96  SpO2: 95%  Weight: 143 lb (64.9 kg)  Height: 5\' 9"  (1.753 m)   REPEAT 185/100   Body mass index is 21.12 kg/m.   General: The patient is well-developed and well-nourished and in no acute distress  HEENT:  Head is [place]/AT.  Sclera are anicteric.    Neck: No carotid bruits are noted.  The neck is nontender.  Cardiovascular: The heart has a regular rate  and rhythm with a normal S1 and S2. There were no murmurs, gallops or rubs.    Skin: Extremities are without rash or  edema.  Musculoskeletal:  Back is nontender  Neurologic Exam  Mental status: The patient is alert and oriented x 1 at the time of the examination. Cannot do simple math.  She has apraxia.   Although speech did not seem much affected during conversation, she will was unable to name items.  She was able to read and write.  She scored 10/30 on the [facility] cognitive assessment.  Details are above..  Cranial nerves: Extraocular movements are full. Pupils are equal, round, and reactive to light and accomodation.  Visual fields are full.  Facial symmetry is present. There is good facial sensation to soft touch bilaterally.Facial strength is normal.  Trapezius and sternocleidomastoid strength is normal. No dysarthria is noted.  The tongue is midline, and the patient has symmetric elevation of the soft palate. No obvious hearing deficits are noted.  Motor:  Muscle bulk is normal.   Tone is normal. Strength is  5 / 5 in all 4 extremities.   Sensory: Sensory testing is intact to vibration sensation in all 4 extremities.  She reported reduced touch sensation in the right hand relative to the left but symmetric sensation in the legs  Coordination: Cerebellar testing reveals good finger-nose-finger and heel-to-shin bilaterally.  Gait and station: Station is normal.   Gait is slightly wide..  She can turn 180 degrees in 4 steps.. Tandem gait is reduced. Romberg is negative.   Reflexes: Deep tendon reflexes are symmetric and normal bilaterally.   Plantar responses are flexor.    DIAGNOSTIC DATA (LABS, IMAGING, TESTING) - I reviewed patient records, labs, notes, testing and imaging myself where available.  Lab Results  Component Value Date   WBC 5.5 04/26/2019   HGB 14.3 04/26/2019   HCT 42.0 04/26/2019   MCV 89.3 04/26/2019   PLT 176 04/26/2019      Component Value Date/Time    NA 142 04/26/2019 0115   K 3.8 04/26/2019 0115   CL 104 04/26/2019 0115   CO2 26 04/26/2019 0016   GLUCOSE 115 (H) 04/26/2019 0115   BUN 16 04/26/2019 0115   CREATININE 1.20 (  H) 04/26/2019 0115   CALCIUM 9.6 04/26/2019 0016   PROT 6.6 04/26/2019 0016   ALBUMIN 3.7 04/26/2019 0016   AST 24 04/26/2019 0016   ALT 8 04/26/2019 0016   ALKPHOS 46 04/26/2019 0016   BILITOT 0.6 04/26/2019 0016   GFRNONAA 43 (L) 04/26/2019 0016   GFRAA 50 (L) 04/26/2019 0016   Lab Results  Component Value Date   CHOL 279 (H) 04/26/2019   HDL 43 04/26/2019   LDLCALC 190 (H) 04/26/2019   TRIG 230 (H) 04/26/2019   CHOLHDL 6.5 04/26/2019   Lab Results  Component Value Date   HGBA1C 5.7 (H) 04/26/2019   No results found for: [id number] Lab Results  Component Value Date   TSH 2.197 04/27/2019       ASSESSMENT AND PLAN  Memory loss - Plan: Vitamin B1, RPR, MR BRAIN WO CONTRAST  Apraxia - Plan: Vitamin B1, RPR, MR BRAIN WO CONTRAST  Vitamin D deficiency - Plan: VITAMIN D 25 Hydroxy (Vit-D Deficiency, Fractures)  Aphasia    In summary, Crystal Day is an 86 year old woman with an acute change in her mental status that began the week between Christmas and New Year's with further changes that happened about 2 weeks ago.  The MRI from 08/30/2020, in between the 2 events, did not show any acute findings.  Cognition is quite affected with a Montreal cognitive assessment score of 10 putting her in the moderate dementia category.  Of note, besides memory she also had deficits with simple calculations, apraxia and language.  I will check another MRI to determine if she is having ischemic changes.  Additionally we will check thiamine and RPR.  If the MRI shows any acute findings we would need to check a carotid Doppler and perhaps an echocardiogram.   Also consider aspirin or Plavix.  If symptoms do not improve over the next few weeks consider referral for cognitive and speech therapy.  Blood pressure is  elevated.  She will take BP medications when she gets home and recheck in 2-3 hours   If still high she is advised to contact her primary care provider.   Crystal Day A. Felecia Shelling, MD, [facility] 04/18/1193, 17:40 AM Certified in Neurology, Clinical Neurophysiology, Sleep Medicine and Neuroimaging  [facility] [address], [place], [place] [address] [phone]

## 2020-09-25 NOTE — Telephone Encounter (Signed)
Yes, he wants another one. Had change in cognition, wants to look for acute changes since last MRI

## 2020-09-25 NOTE — Telephone Encounter (Signed)
Just double checking. This patient had an MRI done on 08/30/20. Do you want her to repeat that MRI now or wait?

## 2020-09-25 NOTE — Telephone Encounter (Signed)
Noted, thank you

## 2020-09-27 ENCOUNTER — Encounter: Payer: Self-pay | Admitting: Neurology

## 2020-09-28 ENCOUNTER — Other Ambulatory Visit: Payer: Self-pay

## 2020-09-28 ENCOUNTER — Ambulatory Visit
Admission: RE | Admit: 2020-09-28 | Discharge: 2020-09-28 | Disposition: A | Discharge status: Home / Self Care | Payer: PPO | Source: Ambulatory Visit | Attending: Neurology | Admitting: Neurology

## 2020-09-28 DIAGNOSIS — R413 Other amnesia: Secondary | ICD-10-CM

## 2020-09-28 DIAGNOSIS — R482 Apraxia: Secondary | ICD-10-CM | POA: Diagnosis not present

## 2020-09-30 ENCOUNTER — Telehealth: Payer: Self-pay | Admitting: Neurology

## 2020-09-30 ENCOUNTER — Other Ambulatory Visit: Payer: Self-pay | Admitting: Neurology

## 2020-09-30 DIAGNOSIS — I639 Cerebral infarction, unspecified: Secondary | ICD-10-CM

## 2020-09-30 DIAGNOSIS — I63312 Cerebral infarction due to thrombosis of left middle cerebral artery: Secondary | ICD-10-CM

## 2020-09-30 DIAGNOSIS — E559 Vitamin D deficiency, unspecified: Secondary | ICD-10-CM | POA: Insufficient documentation

## 2020-09-30 MED ORDER — CLOPIDOGREL BISULFATE 75 MG PO TABS: 75.0000 mg | ORAL_TABLET | Freq: Every day | ORAL | 3 refills | Status: DC

## 2020-09-30 MED ORDER — ROSUVASTATIN CALCIUM 10 MG PO TABS: 10.0000 mg | ORAL_TABLET | Freq: Every day | ORAL | 3 refills | Status: DC

## 2020-09-30 MED ORDER — VITAMIN D (ERGOCALCIFEROL) 1.25 MG (50000 UNIT) PO CAPS: 50000.0000 [IU] | ORAL_CAPSULE | ORAL | 1 refills | Status: DC

## 2020-09-30 NOTE — Telephone Encounter (Signed)
I spoke with her daughter Crystal Day.  I discussed that Crystal Day had a new stroke in the pole of the brain in the left hemisphere that would explain her new symptoms with language, processing and inability to do simple math.  She had a thorough stroke evaluation in 2020 after another stroke.  I will go ahead and check a carotid Dopplers to make sure that she has not had progression of the stenosis that was mild in the past.  We will also change from aspirin to Plavix.  I will send in the prescription.  She had been on statin several times for elevated cholesterol but will get muscle aches.  I will place her on a low-dose of Crestor to see if she can tolerate that better.  If not we may need to consider an alternative.

## 2020-10-03 ENCOUNTER — Ambulatory Visit
Admission: RE | Admit: 2020-10-03 | Discharge: 2020-10-03 | Disposition: A | Discharge status: Home / Self Care | Payer: PPO | Source: Ambulatory Visit | Attending: Neurology | Admitting: Neurology

## 2020-10-03 ENCOUNTER — Telehealth: Payer: Self-pay | Admitting: *Deleted

## 2020-10-03 ENCOUNTER — Other Ambulatory Visit: Payer: Self-pay

## 2020-10-03 DIAGNOSIS — I6523 Occlusion and stenosis of bilateral carotid arteries: Secondary | ICD-10-CM | POA: Diagnosis not present

## 2020-10-03 DIAGNOSIS — I63312 Cerebral infarction due to thrombosis of left middle cerebral artery: Secondary | ICD-10-CM

## 2020-10-03 NOTE — Telephone Encounter (Signed)
-----   Message from Britt Bottom, MD sent at 10/03/2020  4:25 PM EST ----- Please let her know that the carotid Doppler studies just showed mild narrowing, not enough to be concerned about.  She should continue the clopidogrel.  After 5 days of overlap she should stop the aspirin

## 2020-10-03 NOTE — Telephone Encounter (Signed)
Called and LVM for daughter, [name] (on [place]). Relayed Dr. Garth Bigness message about results. Advised her to call back if she has any further questions/concerns.

## 2020-10-05 LAB — RPR: RPR Ser Ql: NONREACTIVE

## 2020-10-05 LAB — VITAMIN B1: Thiamine: 138.6 nmol/L (ref 66.5–200.0)

## 2020-10-05 LAB — VITAMIN D 25 HYDROXY (VIT D DEFICIENCY, FRACTURES): Vit D, 25-Hydroxy: 16.2 ng/mL — ABNORMAL LOW (ref 30.0–100.0)

## 2020-10-07 ENCOUNTER — Telehealth: Payer: Self-pay | Admitting: *Deleted

## 2020-10-07 NOTE — Telephone Encounter (Signed)
Called and spoke with daughter, [name]. Relayed results per MD note. She states MD called in Vit D 50000U a week on 09/30/20. Wondering if she should take this or do 2000U/day. Advised I will speak with MD and call her back.

## 2020-10-07 NOTE — Telephone Encounter (Signed)
Per Dr. Felecia Shelling, he would like her to take 50,000U/week x4weeks and then 2,000U/week thereafter. I called daughter and relayed this. She verbalized understanding and appreciation.

## 2020-10-07 NOTE — Telephone Encounter (Signed)
-----   Message from Britt Bottom, MD sent at 10/06/2020  5:07 PM EST ----- Vitamin D is low.  She should take 2000 units daily over-the-counter.

## 2020-10-10 ENCOUNTER — Other Ambulatory Visit: Payer: Self-pay

## 2020-10-10 ENCOUNTER — Ambulatory Visit: Payer: PPO | Attending: Physician Assistant

## 2020-10-10 ENCOUNTER — Ambulatory Visit: Payer: PPO

## 2020-10-10 DIAGNOSIS — R4701 Aphasia: Secondary | ICD-10-CM | POA: Diagnosis not present

## 2020-10-10 DIAGNOSIS — R41841 Cognitive communication deficit: Secondary | ICD-10-CM | POA: Diagnosis not present

## 2020-10-10 NOTE — Therapy (Signed)
[facility] [address] [place], [place], [address] Phone: [phone]   Fax:  [phone]  Speech Language Pathology Evaluation  Patient Details  Name: Crystal Day MRN: [id number] Date of Birth: May 21, 1935 Referring Provider (SLP): Ref: Benjiman Core PA Felecia Shelling, Richard MD)   Encounter Date: 10/10/2020   End of Session - 10/10/20 1414    Visit Number 1    Number of Visits 17    Date for SLP Re-Evaluation 01/08/21    SLP Start Time 2671    SLP Stop Time  1400    SLP Time Calculation (min) 38 min    Activity Tolerance Patient tolerated treatment well           Past Medical History:  Diagnosis Date  . Anginal pain ([place]) since 2014  . Arthritis    just in the hands  . Cancer ([place]) 1988 or 89   basal cell carcinoma on face s/p Mohs procedure  . Complication of anesthesia    nausea  . History of wheezing    in spring and fall  . Hyperlipidemia   . Hypertension   . Non-obstructive CAD    a. 09/2013 Cath/NSTEMI: nonobs dzs.  . Non-STEMI (non-ST elevated myocardial infarction) ([place])    a. 09/2013 - peak trop 5.38;  b. 09/2013 Cath: nonobs dzs-->Med Rx.  . Pneumonia    "in my 20's"  . PONV (postoperative nausea and vomiting)   . Stroke [facility]) 2009   a. 09/2015 L Thalamic Lacunar infarct, presumed to be 2/2 small vessel dzs, residuals include numbness on right side of lips and right finger tips; b. 09/2015 Echo: nl LV fxn; c. 09/2015 Carotid U/S: mild bilat ICA stenosis.    Past Surgical History:  Procedure Laterality Date  . ABDOMINAL HYSTERECTOMY  1974  . BREAST SURGERY Left 1976   lumpectomy  . EYE SURGERY     cataract surgery, both eyes  . FRACTURE SURGERY Right 1971   thumb and index finger  . LEFT HEART CATHETERIZATION WITH CORONARY ANGIOGRAM N/A 09/29/2013   Procedure: LEFT HEART CATHETERIZATION WITH CORONARY ANGIOGRAM;  Surgeon: Peter M Martinique, MD;  Location: [facility] CATH LAB;  Service: Cardiovascular;   Laterality: N/A;  . PARTIAL KNEE ARTHROPLASTY Right 12/31/2014   Procedure: RIGHT KNEE MEDIAL UNICOMPARTMENTAL ARTHROPLASTY;  Surgeon: Gaynelle Arabian, MD;  Location: [facility] ORS;  Service: Orthopedics;  Laterality: Right;    There were no vitals filed for this visit.   Subjective Assessment - 10/10/20 1326    Subjective Pt arrived 7 mins late. Pt stated "Im not talking like I want to"      Patient is accompained by: Family member   Crystal Day   Currently in Pain? No/denies              SLP Evaluation [facility] - 10/10/20 1326      SLP Visit Information   SLP Received On 09/24/20    Referring Provider (SLP) Ref: Benjiman Core PA   Arlice Colt MD   Onset Date 09/30/2020    Medical Diagnosis Acute CVA      Subjective   Patient/Family Stated Goal Improve word finding and memory      General Information   HPI Ms. Curran is an 86 year old woman with an acute change in her mental status that began the week between Christmas and New Year's with further changes that happened about 2 weeks ago. The MRI from 08/30/2020, in between the 2 events, did not show any acute findings.  Repeat MRI completed with results reported on 09/30/2020, revealing Crystal Day had a new stroke in the pole of the brain in the left hemisphere that would explain her new symptoms with language, processing and inability to do simple math. Cognition was quite affected with a Montreal cognitive assessment score of 10 putting her in the moderate dementia category.    Mobility Status ambulatory      Balance Screen   Has the patient fallen in the past 6 months Yes   slipped/fell at church 12-18 (did not hit head)   How many times? 1    Has the patient had a decrease in activity level because of a fear of falling?  No    Is the patient reluctant to leave their home because of a fear of falling?  No      Prior Functional Status   Cognitive/Linguistic Baseline Baseline deficits    Baseline deficit details memory    Type of Home House      Lives With Alone    Available Support Family;Friend(s)    Education GED    Vocation Retired   cigarette factory     Cognition   Overall Cognitive Status Impaired/Different from baseline    Area of Impairment Memory;Orientation;Following commands    Orientation Level Disoriented to;Place;Time;Situation    Memory Decreased short-term memory    Following Commands Follows one step commands inconsistently    Memory Impaired    Awareness Appears intact      Auditory Comprehension   Overall Auditory Comprehension Impaired    Yes/No Questions Impaired   75% of QAB   Basic Biographical Questions 76-100% accurate    Basic Immediate Environment Questions 75-100% accurate    Complex Questions 50-74% accurate    Commands Impaired    Conversation Simple    Interfering Components Working memory;Processing speed    EffectiveTechniques Extra processing time;Pausing;Repetition      Verbal Expression   Overall Verbal Expression Impaired    Repetition No impairment    Naming Impairment    Responsive 26-50% accurate    Confrontation 75-100% accurate    Convergent 25-49% accurate    Divergent 25-49% accurate    Verbal Errors Perseveration;Semantic paraphasias    Effective Techniques Semantic cues;Phonemic cues      Oral Motor/Sensory Function   Overall Oral Motor/Sensory Function Impaired    Labial Symmetry Within Functional Limits    Labial Coordination Reduced    Lingual ROM Other (Comment)   reduced   Lingual Symmetry Within Functional Limits    Lingual Strength Within Functional Limits    Lingual Coordination Reduced      Motor Speech   Overall Motor Speech Appears within functional limits for tasks assessed      Standardized Assessments   Standardized Assessments  Other Assessment   Quick Aphasia Battery                          SLP Education - 10/10/20 1404    Education Details eval results, possible goals, word finding compensations    Person(s) Educated  Patient;Child(ren)    Methods Explanation;Demonstration    Comprehension Verbalized understanding;Returned demonstration            SLP Short Term Goals - 10/10/20 1423      SLP SHORT TERM GOAL #1   Title Pt will demonstrate use of word finding compensations in simple 5 minute conversation with min A over 2 sessions    Time 4  Period Weeks    Status New      SLP SHORT TERM GOAL #2   Title Pt's caregiver will appropriately cue patient when word finding episodes occur with rare min A over 2 sessions    Time 4    Period Weeks    Status New      SLP SHORT TERM GOAL #3   Title Pt will generate functional sentences to improve communication of basic wants/needs with min A over 2 sessions    Time 4    Period Weeks    Status New      SLP SHORT TERM GOAL #4   Title Pt will complete formal cognitive linguistic assessment in first 2 sessions    Time 2    Period Weeks    Status New      SLP SHORT TERM GOAL #5   Title Pt will carryover external aids to recall to-do list, appointments and pertinent information with occasional min A over 3 sessions    Time 4    Period Weeks    Status New            SLP Long Term Goals - 10/10/20 1439      SLP LONG TERM GOAL #1   Title Pt will use word finding compensations in 10 to 15 minute mod complex conversation with rare min A over 2 sessions    Time 8    Period Weeks    Status New      SLP LONG TERM GOAL #2   Title Pt will generate functional sentences to improve communication of complex wants/needs with min A over 2 sessions    Time 8    Period Weeks    Status New      SLP LONG TERM GOAL #3   Title Pt will carryover external aids to recall to-do list, appointments and pertinent information with rare min A over 3 sessions    Time [place] - 10/10/20 [place] was referred for OPST evaluation given new onset of aphasia, apraxia, and worsening memory  compared to baseline. Pt has hx of multiple strokes, with minimal residual deficits reported by neurologist and daughter, Crystal Day. Recent MRI confirmed acute CVA between January and February 2022, resulting in changes in language and cognition.  Crystal Day stated "I'm not talking like I want to. It comes out wrong" and "I don't remember things." Frustration related to word finding reported. Pt was independent with all ADLS and iADLs prior to most recent stroke. Pt still lives alone, with daughter and friends providing frequent checks and assistance as needed. Mild decline in memory reported by daughter, with formal cognitive linguistic assessment to be completed in upcoming [place] session. Quick Aphasia Battery completed this session revealing mild receptive deficits and significant expressive language deficits as c/b empty speech, word finding pauses/searches, abandoned utterances, circuloction, semantic paraphasis ("starfish" for octopus), perseverations, and neogolisms ("starshape"). 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           Patient will benefit from skilled therapeutic intervention in order to improve the following deficits and impairments:   Aphasia  Cognitive communication deficit    Problem List Patient Active Problem List  Diagnosis Date Noted  . Vitamin D deficiency 09/30/2020  . CVA (cerebral vascular accident) ([place]) 04/26/2019  . Non-STEMI (non-ST elevated myocardial infarction) ([place])   . Non-obstructive CAD   . Hypertension   . TIA (transient ischemic attack)   . Lacunar infarct, acute ([place]) 10/03/2015  . CKD (chronic kidney disease) stage 3, GFR 30-59 ml/min ([facility]) 10/03/2015  . Acute stroke due to ischemia ([place])   . HLD (hyperlipidemia)   . OA (osteoarthritis) of knee 12/31/2014  . Syncope 09/28/2014   . Hyperlipidemia 04/17/2014  . Atherosclerosis of native coronary artery of native heart without angina pectoris 10/05/2013  . NSTEMI (non-ST elevated myocardial infarction) ([place]) 09/29/2013  . Chest pain 09/28/2013  . Essential hypertension 09/28/2013  . Acute renal failure ([place]) 09/28/2013    Alinda Deem, MA CCC-SLP 10/10/2020, 4:40 PM  [facility] [address] [place], [place], [address] Phone: [phone]   Fax:  [phone]  Name: Crystal Day MRN: [id number] Date of Birth: Jan 07, 1935

## 2020-10-11 ENCOUNTER — Ambulatory Visit: Payer: PPO

## 2020-10-14 ENCOUNTER — Ambulatory Visit: Payer: PPO

## 2020-10-14 ENCOUNTER — Other Ambulatory Visit: Payer: Self-pay

## 2020-10-14 DIAGNOSIS — R4701 Aphasia: Secondary | ICD-10-CM

## 2020-10-14 DIAGNOSIS — R41841 Cognitive communication deficit: Secondary | ICD-10-CM

## 2020-10-14 NOTE — Therapy (Signed)
[facility] [address] [place], [place], [address] Phone: [phone]   Fax:  [phone]  Speech Language Pathology Treatment  Patient Details  Name: Crystal Day MRN: [id number] Date of Birth: [birth year] Referring Provider (SLP): Ref: Benjiman Core PA (Neuro: Arlice Colt MD)   Encounter Date: 10/14/2020   End of Session - 10/14/20 1256    Visit Number 2    Number of Visits 17    Date for SLP Re-Evaluation 01/08/21    SLP Start Time 1109    SLP Stop Time  1147    SLP Time Calculation (min) 38 min    Activity Tolerance Patient tolerated treatment well           Past Medical History:  Diagnosis Date  . Anginal pain ([place]) since 2014  . Arthritis    just in the hands  . Cancer ([place]) 1988 or 89   basal cell carcinoma on face s/p Mohs procedure  . Complication of anesthesia    nausea  . History of wheezing    in spring and fall  . Hyperlipidemia   . Hypertension   . Non-obstructive CAD    a. 09/2013 Cath/NSTEMI: nonobs dzs.  . Non-STEMI (non-ST elevated myocardial infarction) ([place])    a. 09/2013 - peak trop 5.38;  b. 09/2013 Cath: nonobs dzs-->Med Rx.  . Pneumonia    "in my 20's"  . PONV (postoperative nausea and vomiting)   . Stroke [facility]) 2009   a. 09/2015 L Thalamic Lacunar infarct, presumed to be 2/2 small vessel dzs, residuals include numbness on right side of lips and right finger tips; b. 09/2015 Echo: nl LV fxn; c. 09/2015 Carotid U/S: mild bilat ICA stenosis.    Past Surgical History:  Procedure Laterality Date  . ABDOMINAL HYSTERECTOMY  1974  . BREAST SURGERY Left 1976   lumpectomy  . EYE SURGERY     cataract surgery, both eyes  . FRACTURE SURGERY Right 1971   thumb and index finger  . LEFT HEART CATHETERIZATION WITH CORONARY ANGIOGRAM N/A 09/29/2013   Procedure: LEFT HEART CATHETERIZATION WITH CORONARY ANGIOGRAM;  Surgeon: Peter M Martinique, MD;  Location: [facility] CATH LAB;  Service: Cardiovascular;   Laterality: N/A;  . PARTIAL KNEE ARTHROPLASTY Right 12/31/2014   Procedure: RIGHT KNEE MEDIAL UNICOMPARTMENTAL ARTHROPLASTY;  Surgeon: Gaynelle Arabian, MD;  Location: [facility];  Service: Orthopedics;  Laterality: Right;    There were no vitals filed for this visit.   Subjective Assessment - 10/14/20 1113    Subjective "I went shopping"    Patient is accompained by: Family member    Currently in Pain? No/denies                 ADULT SLP TREATMENT - 10/14/20 1113      General Information   Behavior/Cognition Alert;Cooperative      Treatment Provided   Treatment provided Cognitive-Linquistic      Cognitive-Linquistic Treatment   Treatment focused on Aphasia;Cognition    Skilled Treatment CLQT initiated this session per reported changes in cognition at time of evaluation. Design generation task remains to be completed. Pt demonstrated increased difficulty with recall of verbal/written instructions and story retall. Poor error awareness exhibited (i.e., multiple numbers/hand on clock). Expected performance on generative naming tasks (4 words in total). SLP recommended to daughter who was present to monitor med management at home due to limited insight into current deficits and poor error awareness.      Assessment / Recommendations /  Plan   Plan Continue with current plan of care      Progression Toward Goals   Progression toward goals Progressing toward goals            SLP Education - 10/14/20 1255    Education Details cognitive linguistic results and implications for home environment (need to double check medications), functional naming activites at home    Person(s) Educated Patient;Child(ren)    Methods Explanation;Demonstration    Comprehension Verbalized understanding;Returned demonstration;Need further instruction            SLP Short Term Goals - 10/14/20 1131      SLP SHORT TERM GOAL #1   Title Pt will demonstrate use of word finding compensations in simple 5  minute conversation with min A over 2 sessions    Time 4    Period Weeks    Status On-going      SLP SHORT TERM GOAL #2   Title Pt's caregiver will appropriately cue patient when word finding episodes occur with rare min A over 2 sessions    Time 4    Period Weeks    Status On-going      SLP SHORT TERM GOAL #3   Title Pt will generate functional sentences to improve communication of basic wants/needs with min A over 2 sessions    Time 4    Period Weeks    Status On-going      SLP SHORT TERM GOAL #4   Title Pt will complete formal cognitive linguistic assessment in first 2 sessions    Time 2    Period Weeks    Status On-going      SLP SHORT TERM GOAL #5   Title Pt will carryover external aids to recall to-do list, appointments and pertinent information with occasional min A over 3 sessions    Time 4    Period Weeks    Status On-going            SLP Long Term Goals - 10/14/20 1131      SLP LONG TERM GOAL #1   Title Pt will use word finding compensations in 10 to 15 minute mod complex conversation with rare min A over 2 sessions    Time 8    Period Weeks    Status On-going      SLP LONG TERM GOAL #2   Title Pt will generate functional sentences to improve communication of complex wants/needs with min A over 2 sessions    Time 8    Period Weeks    Status On-going      SLP LONG TERM GOAL #3   Title Pt will carryover external aids to recall to-do list, appointments and pertinent information with rare min A over 3 sessions    Time 8    Period Weeks    Status On-going            Plan - 10/14/20 1257    Clinical Impression [place] was referred for OPST evaluation given new onset of aphasia, apraxia, and worsening memory compared to baseline. CLQT initated this session, revealing mod-marked cognitive deficits related to memory, attention, following instruction, and awareness. Pt is reportedly increasingly independent with ADLS and iADLs, including managing own  medication. SLP recommended to daughter the need for A with medication management due to poor recall and impaired error awareness. 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    Speech Therapy Frequency 2x / week    Duration 8 weeks    Treatment/Interventions Compensatory strategies;Patient/family education;Functional tasks;Cueing hierarchy;Multimodal communcation approach;Cognitive reorganization;Compensatory techniques;Internal/external aids;SLP instruction and feedback    Potential to Achieve Goals Good    Potential Considerations Co-morbidities    SLP Home Exercise Plan compensations, med management assistance    Consulted and Agree with Plan of Care Patient;Family member/caregiver           Patient will benefit from skilled therapeutic intervention in order to improve the following deficits and impairments:   Aphasia  Cognitive communication deficit    Problem List Patient Active Problem List   Diagnosis Date Noted  . Vitamin D deficiency 09/30/2020  . CVA (cerebral vascular accident) ([place]) 04/26/2019  . Non-STEMI (non-ST elevated myocardial infarction) ([place])   . Non-obstructive CAD   . Hypertension   . TIA (transient ischemic attack)   . Lacunar infarct, acute ([place]) 10/03/2015  . CKD (chronic kidney disease) stage 3, GFR 30-59 ml/min ([facility]) 10/03/2015  . Acute stroke due to ischemia ([place])   . HLD (hyperlipidemia)   . OA (osteoarthritis) of knee 12/31/2014  . Syncope 09/28/2014  . Hyperlipidemia 04/17/2014  . Atherosclerosis of native coronary artery of native heart without angina pectoris 10/05/2013  . NSTEMI (non-ST elevated myocardial infarction) ([place]) 09/29/2013  . Chest pain 09/28/2013  . Essential hypertension 09/28/2013  . Acute renal failure ([place]) 09/28/2013    Alinda Deem, MA  CCC-SLP 10/14/2020, 1:03 PM  [facility] [address] [place], [place], [address] Phone: [phone]   Fax:  [phone]   Name: Crystal Day MRN: [id number] Date of Birth: 1934-12-08

## 2020-10-16 ENCOUNTER — Emergency Department (HOSPITAL_COMMUNITY)
Admission: EM | Admit: 2020-10-16 | Discharge: 2020-10-16 | Disposition: A | Discharge status: Home / Self Care | Payer: PPO | Attending: Emergency Medicine | Admitting: Emergency Medicine

## 2020-10-16 ENCOUNTER — Encounter (HOSPITAL_COMMUNITY): Payer: Self-pay | Admitting: Emergency Medicine

## 2020-10-16 ENCOUNTER — Other Ambulatory Visit: Payer: Self-pay

## 2020-10-16 ENCOUNTER — Ambulatory Visit: Payer: PPO | Attending: Physician Assistant

## 2020-10-16 DIAGNOSIS — Z5321 Procedure and treatment not carried out due to patient leaving prior to being seen by health care provider: Secondary | ICD-10-CM | POA: Insufficient documentation

## 2020-10-16 DIAGNOSIS — R4701 Aphasia: Secondary | ICD-10-CM

## 2020-10-16 DIAGNOSIS — R5381 Other malaise: Secondary | ICD-10-CM | POA: Diagnosis not present

## 2020-10-16 DIAGNOSIS — R41841 Cognitive communication deficit: Secondary | ICD-10-CM | POA: Diagnosis not present

## 2020-10-16 DIAGNOSIS — R0989 Other specified symptoms and signs involving the circulatory and respiratory systems: Secondary | ICD-10-CM | POA: Diagnosis not present

## 2020-10-16 DIAGNOSIS — R69 Illness, unspecified: Secondary | ICD-10-CM | POA: Diagnosis not present

## 2020-10-16 NOTE — Therapy (Signed)
[facility] [address] [place], [place], [address] Phone: [phone]   Fax:  [phone]  Speech Language Pathology Treatment  Patient Details  Name: Crystal Day MRN: [id number] Date of Birth: 1934-09-14 Referring Provider (SLP): Ref: Benjiman Core PA (Neuro: Arlice Colt MD)   Encounter Date: 10/16/2020   End of Session - 10/16/20 1255    Visit Number 3    Number of Visits 17    Date for SLP Re-Evaluation 01/08/21    SLP Start Time 1101    SLP Stop Time  1145    SLP Time Calculation (min) 44 min    Activity Tolerance Patient tolerated treatment well           Past Medical History:  Diagnosis Date  . Anginal pain ([place]) since 2014  . Arthritis    just in the hands  . Cancer ([place]) 1988 or 89   basal cell carcinoma on face s/p Mohs procedure  . Complication of anesthesia    nausea  . History of wheezing    in spring and fall  . Hyperlipidemia   . Hypertension   . Non-obstructive CAD    a. 09/2013 Cath/NSTEMI: nonobs dzs.  . Non-STEMI (non-ST elevated myocardial infarction) ([place])    a. 09/2013 - peak trop 5.38;  b. 09/2013 Cath: nonobs dzs-->Med Rx.  . Pneumonia    "in my 20's"  . PONV (postoperative nausea and vomiting)   . Stroke [facility]) 2009   a. 09/2015 L Thalamic Lacunar infarct, presumed to be 2/2 small vessel dzs, residuals include numbness on right side of lips and right finger tips; b. 09/2015 Echo: nl LV fxn; c. 09/2015 Carotid U/S: mild bilat ICA stenosis.    Past Surgical History:  Procedure Laterality Date  . ABDOMINAL HYSTERECTOMY  1974  . BREAST SURGERY Left 1976   lumpectomy  . EYE SURGERY     cataract surgery, both eyes  . FRACTURE SURGERY Right 1971   thumb and index finger  . LEFT HEART CATHETERIZATION WITH CORONARY ANGIOGRAM N/A 09/29/2013   Procedure: LEFT HEART CATHETERIZATION WITH CORONARY ANGIOGRAM;  Surgeon: Peter M Martinique, MD;  Location: [facility] CATH LAB;  Service: Cardiovascular;   Laterality: N/A;  . PARTIAL KNEE ARTHROPLASTY Right 12/31/2014   Procedure: RIGHT KNEE MEDIAL UNICOMPARTMENTAL ARTHROPLASTY;  Surgeon: Gaynelle Arabian, MD;  Location: [facility] ORS;  Service: Orthopedics;  Laterality: Right;    There were no vitals filed for this visit.   Subjective Assessment - 10/16/20 1101    Subjective "ive just been sitting at the house"    Currently in Pain? No/denies                 ADULT SLP TREATMENT - 10/16/20 1102      General Information   Behavior/Cognition Alert;Cooperative      Treatment Provided   Treatment provided Cognitive-Linquistic      Cognitive-Linquistic Treatment   Treatment focused on Aphasia;Cognition    Skilled Treatment CLQT Radiographer, therapeutic) completed. SLP targeted expressive and receptive language with trials of Semantic Feature Analysis (SFA) due to difficulty with naming. Usual mod A required to name descriptors. Occasional perservation noted. SLP targeted naming 2-5 items in simple personally relevant categories, with occasional empty speech and usual additional processing time required. Pt able to utilize descprition strategy x1 with mod I. SLP trialed multimodal communication (writing words), with pt noted to fixate on words/spelling and forget the task. SLP provided education with daughter re: HEP.  Assessment / Recommendations / Plan   Plan Continue with current plan of care      Progression Toward Goals   Progression toward goals Progressing toward goals            SLP Education - 10/16/20 1255    Education Details SFA, compensations, TalkPath/Constant Therapy    Person(s) Educated Patient;Child(ren)    Methods Explanation;Demonstration;Handout    Comprehension Verbalized understanding;Returned demonstration;Need further instruction            SLP Short Term Goals - 10/16/20 1300      SLP SHORT TERM GOAL #1   Title Pt will demonstrate use of word finding compensations in simple 5 minute conversation with min A  over 2 sessions    Time 4    Period Weeks    Status On-going      SLP SHORT TERM GOAL #2   Title Pt's caregiver will appropriately cue patient when word finding episodes occur with rare min A over 2 sessions    Time 4    Period Weeks    Status On-going      SLP SHORT TERM GOAL #3   Title Pt will generate functional sentences to improve communication of basic wants/needs with min A over 2 sessions    Time 4    Period Weeks    Status On-going      SLP SHORT TERM GOAL #4   Title Pt will complete formal cognitive linguistic assessment in first 2 sessions    Time --    Period --    Status Achieved      SLP SHORT TERM GOAL #5   Title Pt will carryover external aids to recall to-do list, appointments and pertinent information with occasional min A over 3 sessions    Time 4    Period Weeks    Status On-going            SLP Long Term Goals - 10/16/20 1300      SLP LONG TERM GOAL #1   Title Pt will use word finding compensations in 10 to 15 minute mod complex conversation with rare min A over 2 sessions    Time 8    Period Weeks    Status On-going      SLP LONG TERM GOAL #2   Title Pt will generate functional sentences to improve communication of complex wants/needs with min A over 2 sessions    Time 8    Period Weeks    Status On-going      SLP LONG TERM GOAL #3   Title Pt will carryover external aids to recall to-do list, appointments and pertinent information with rare min A over 3 sessions    Time 8    Period Weeks    Status On-going            Plan - 10/16/20 [place] was referred for OPST evaluation to address cognitive communication following recent CVA. SLP introduced SFA and word finding compensations given increased difficulty naming and word finding in conversation. Usual mod A required for SFA. Some independent use of description strategy noted to ID favorite TV show. Frequent empty speech, occasional perseverations, and  rare phonemic paraphasias noted. SLP provided HEP and recommendations with patient and daughter. 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  Speech Therapy Frequency 2x / week    Duration 8 weeks   or 17 visits   Treatment/Interventions Compensatory strategies;Patient/family education;Functional tasks;Cueing hierarchy;Multimodal communcation approach;Cognitive reorganization;Compensatory techniques;Internal/external aids;SLP instruction and feedback    Potential to Achieve Goals Good    Potential Considerations Co-morbidities    SLP Home Exercise Plan provided    Consulted and Agree with Plan of Care Patient;Family member/caregiver           Patient will benefit from skilled therapeutic intervention in order to improve the following deficits and impairments:   Aphasia  Cognitive communication deficit    Problem List Patient Active Problem List   Diagnosis Date Noted  . Vitamin D deficiency 09/30/2020  . CVA (cerebral vascular accident) ([place]) 04/26/2019  . Non-STEMI (non-ST elevated myocardial infarction) ([place])   . Non-obstructive CAD   . Hypertension   . TIA (transient ischemic attack)   . Lacunar infarct, acute ([place]) 10/03/2015  . CKD (chronic kidney disease) stage 3, GFR 30-59 ml/min ([facility]) 10/03/2015  . Acute stroke due to ischemia ([place])   . HLD (hyperlipidemia)   . OA (osteoarthritis) of knee 12/31/2014  . Syncope 09/28/2014  . Hyperlipidemia 04/17/2014  . Atherosclerosis of native coronary artery of native heart without angina pectoris 10/05/2013  . NSTEMI (non-ST elevated myocardial infarction) ([place]) 09/29/2013  . Chest pain 09/28/2013  . Essential hypertension 09/28/2013  . Acute renal failure ([place]) 09/28/2013    Alinda Deem, MA CCC-SLP 10/16/2020, 1:02 PM  [facility] [address] [place], [place], [address] Phone: [phone]   Fax:  [phone]   Name: Crystal Day MRN: [id number] Date of Birth: 1934-10-09

## 2020-10-16 NOTE — ED Notes (Signed)
Patient BIB [facility] from a [facility] for  Complaint of feeling like a pill is stuck in her throat. No dyspnea or troubling swallowing. Patient alert oriented and in no apparent distress at this time.

## 2020-10-16 NOTE — ED Notes (Signed)
Pt stated to this tech that she was feeling better and was going to leave once her daughter arrivied

## 2020-10-16 NOTE — Patient Instructions (Addendum)
   If you have a smartphone or tablet, try Talk Path app (free) or Constant Therapy (free 14 day trial; pay after 14 days)  Make lists of your favorite things (ex: television shows, restaurants, makeup products, etc) and practice naming them. If you already made these, bring them to speech therapy sessions.    Describe the things you are trying to name as best as you can!    Try writing words if you can remember what you want to say to see if that helps get it out.

## 2020-10-16 NOTE — ED Triage Notes (Addendum)
Patient states she was taking her blood pressure medicine when one of the pieces went from her throat into her nose and it still feels like it is stuck. BP in triage 211/86.

## 2020-10-20 IMAGING — CT CT HEAD W/O CM
4 series · 17 of 47 positions shown, 19 images · non-contrast
Comparison: 10/27/2018

CLINICAL DATA: Headache, left arm numbness

EXAM:
CT HEAD WITHOUT CONTRAST
TECHNIQUE: Contiguous axial images were obtained from the base of the skull
through the vertex without intravenous contrast.

[Series 3: head wo · axial · 0.41mm/px · z∈[+1172,+1292]mm · 7 of 32 slices shown, 9 images]
[im 4/32  brain]
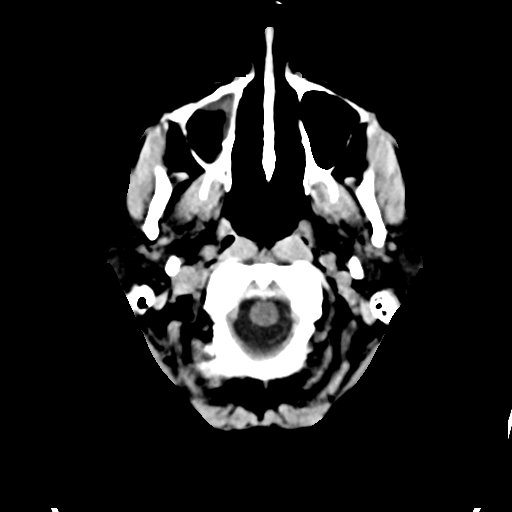
[im 4/32  bone]
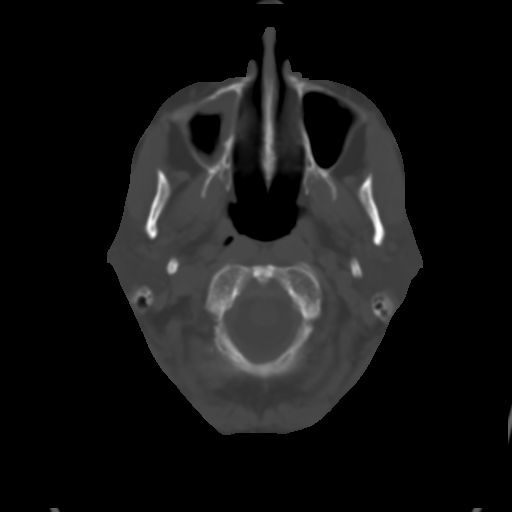
[im 8/32  brain]
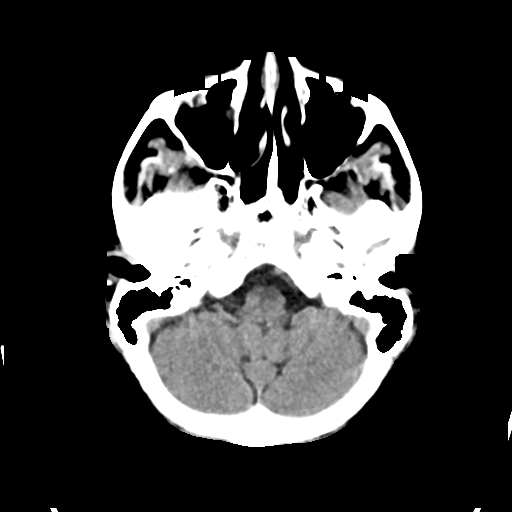
[im 12/32  brain]
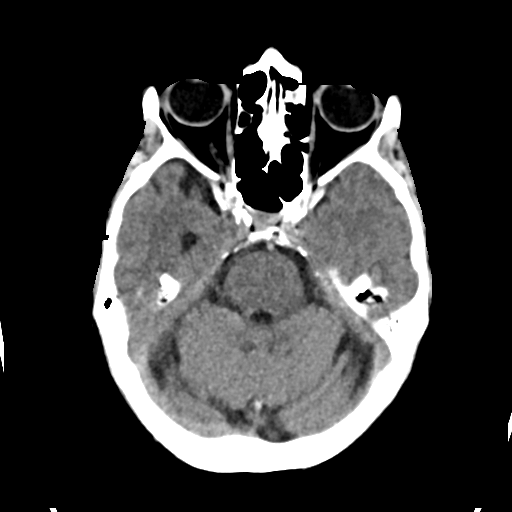
[im 16/32  brain]
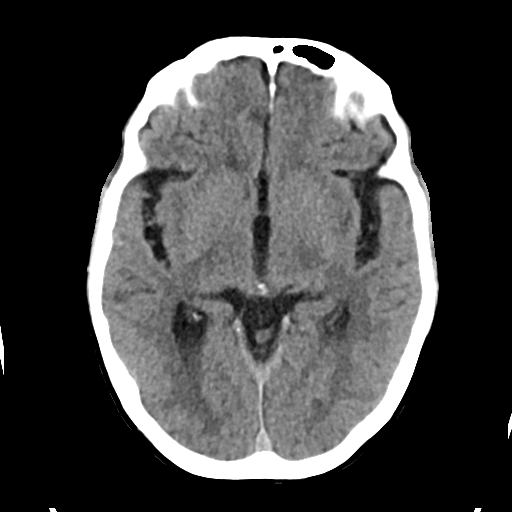
[im 20/32  brain]
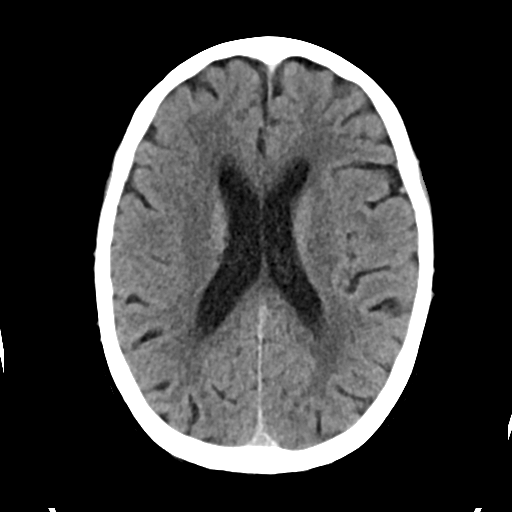
[im 20/32  bone]
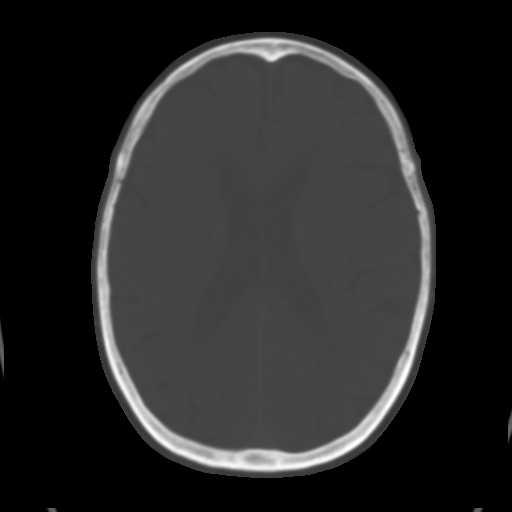
[im 24/32  brain]
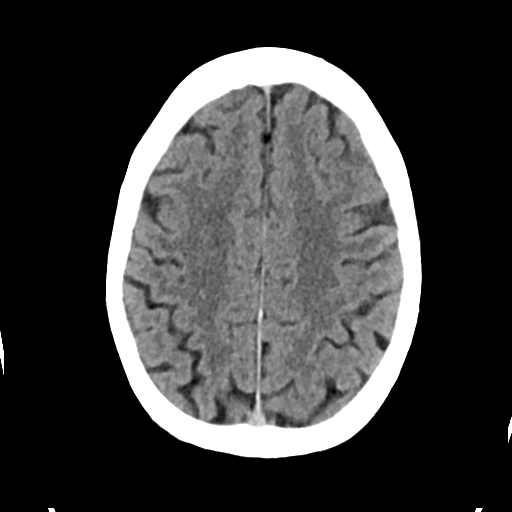
[im 28/32  brain]
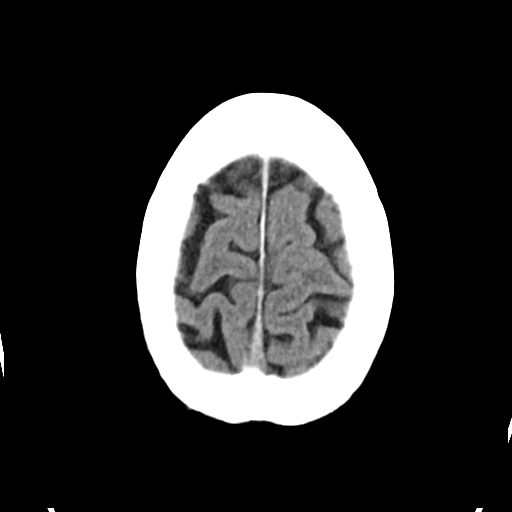

[Series 4: head bone · axial · 0.41mm/px · z∈[+1172,+1228]mm · 4 of 80 slices shown]
[im 8/80  bone]
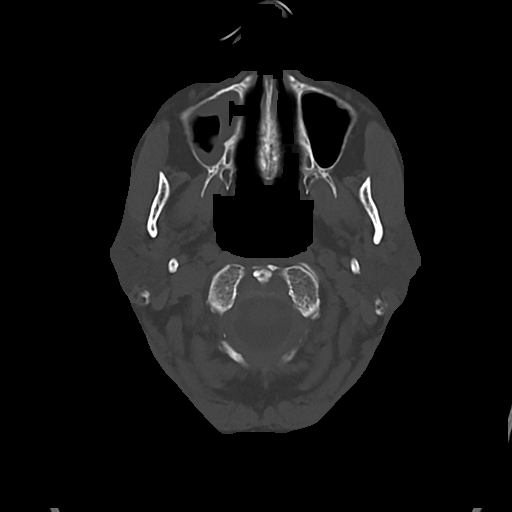
[im 16/80  bone]
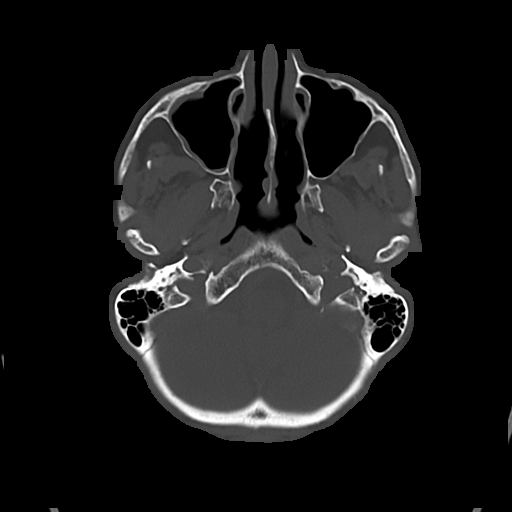
[im 24/80  bone]
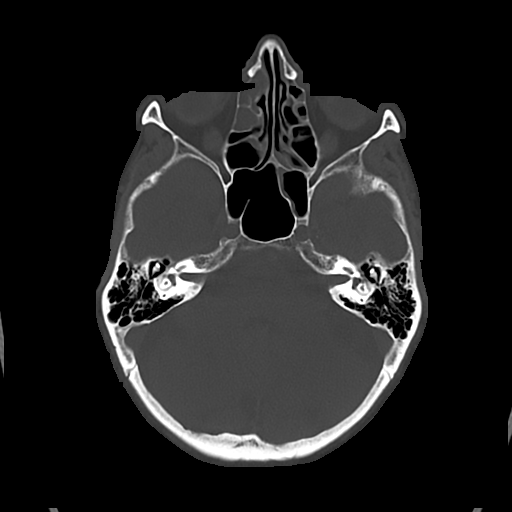
[im 36/80  bone]
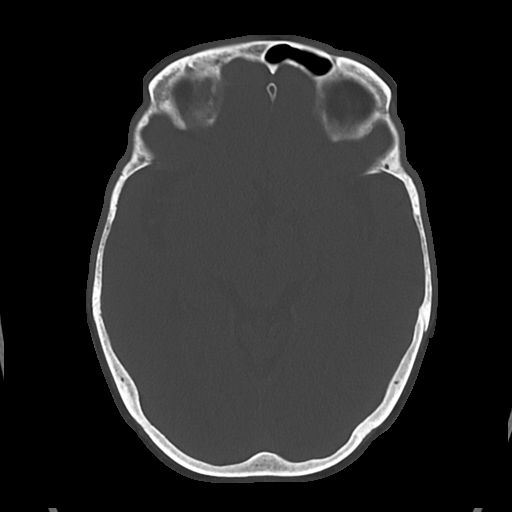

[Series 5: cor soft · coronal · 0.32mm/px · 3 of 66 slices shown]
[im 22/66  brain]
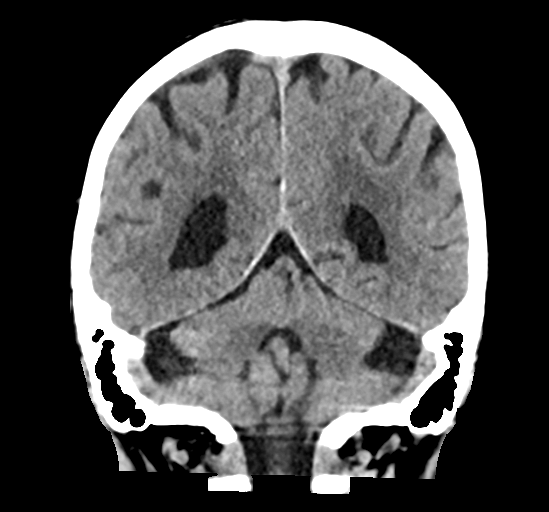
[im 29/66  brain]
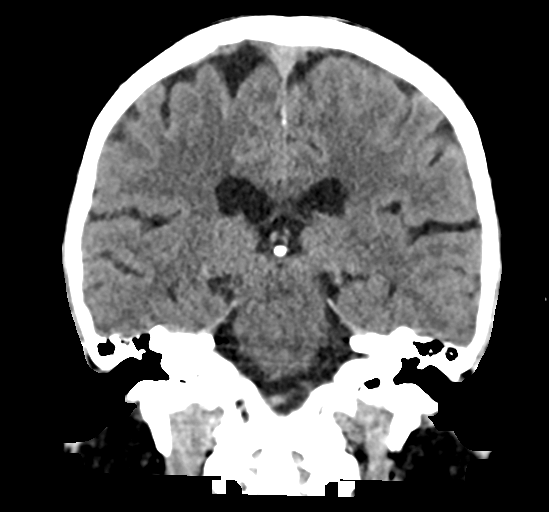
[im 37/66  brain]
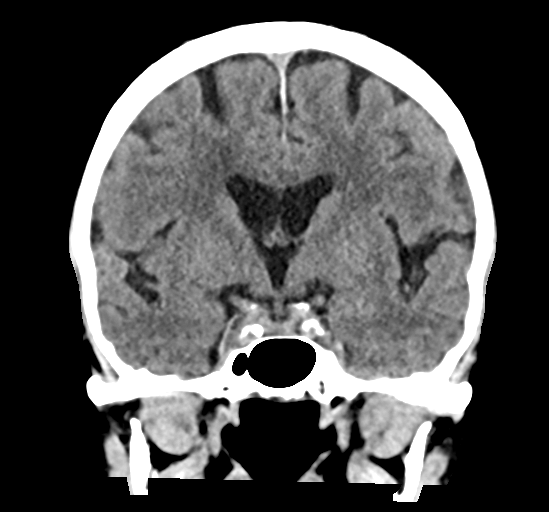

[Series 6: sag soft · sagittal · 0.34mm/px · 3 of 58 slices shown]
[im 20/58  brain]
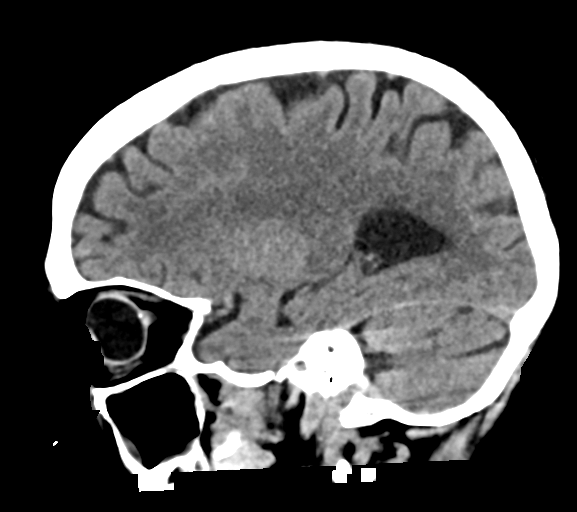
[im 29/58  brain]
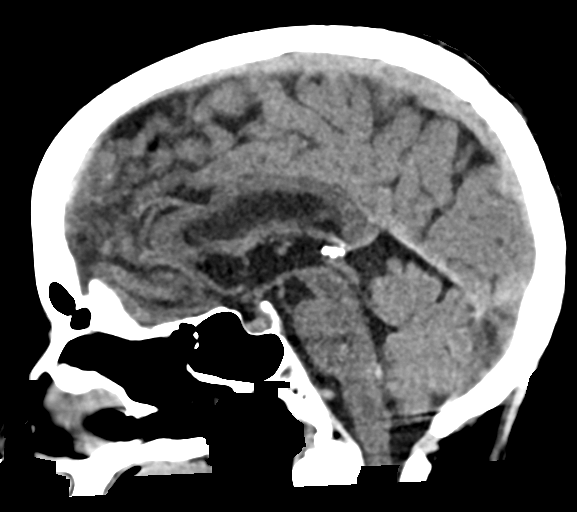
[im 39/58  brain]
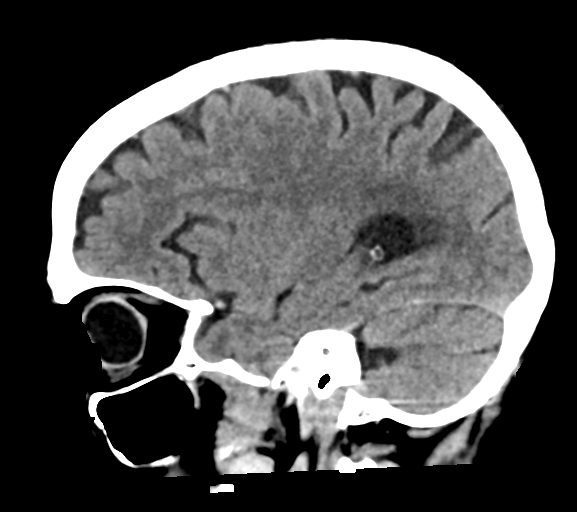

[17 of 47 positions shown; findings below may reference images not displayed]

FINDINGS: Brain: No acute intracranial abnormality. Specifically, no
hemorrhage, hydrocephalus, mass lesion, acute infarction, or
significant intracranial injury.

Vascular: No hyperdense vessel or unexpected calcification.

Skull: No acute calvarial abnormality.

Sinuses/Orbits: Mucosal thickening throughout the ethmoid air cells
and right maxillary sinus. No air-fluid levels.

Other: None
IMPRESSION: No acute intracranial abnormality.

Chronic sinusitis.

## 2020-10-20 IMAGING — CT CT ANGIO NECK
1 of 7 series · 14 of 46 positions shown, 18 images · IV contrast (OMNI)
Comparison: CT head MRI head 04/26/2019

CLINICAL DATA: Stroke follow-up

EXAM:
CT ANGIOGRAPHY HEAD AND NECK
TECHNIQUE: Multidetector CT imaging of the head and neck was performed using
the standard protocol during bolus administration of intravenous
contrast. Multiplanar CT image reconstructions and MIPs were
obtained to evaluate the vascular anatomy. Carotid stenosis
measurements (when applicable) are obtained utilizing NASCET
criteria, using the distal internal carotid diameter as the
denominator.
CONTRAST:  75mL OMNIPAQUE IOHEXOL 350 MG/ML SOLN

[Series 12: thin · axial · 0.44mm/px · z∈[-339,+10]mm · 14 of 766 slices shown, 18 images]
[im 34/766  soft-tissue]
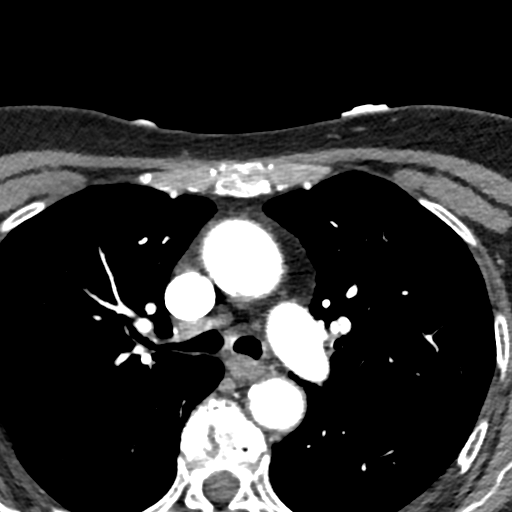
[im 34/766  bone]
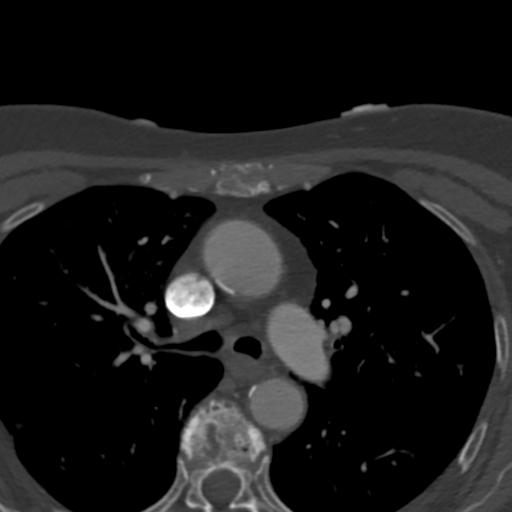
[im 100/766  soft-tissue]
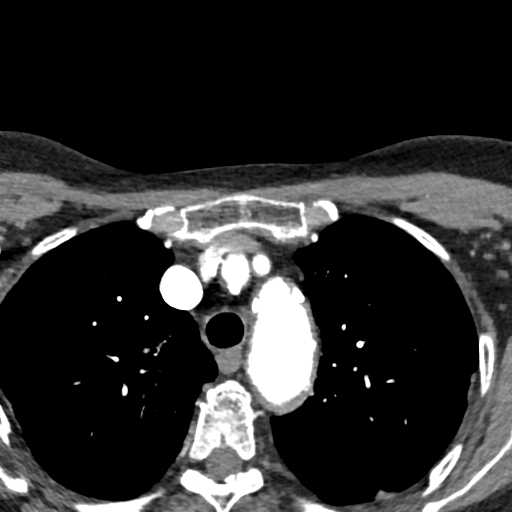
[im 167/766  soft-tissue]
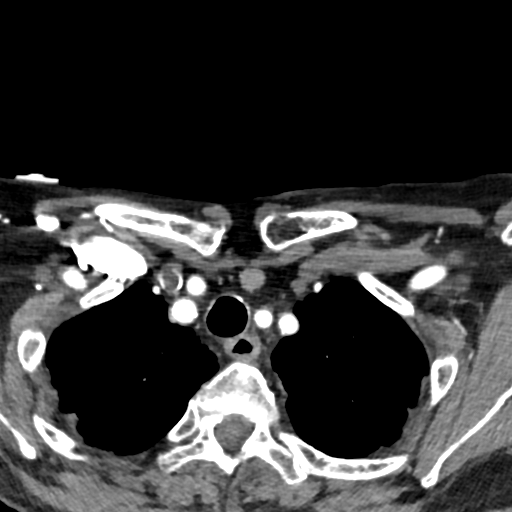
[im 233/766  soft-tissue]
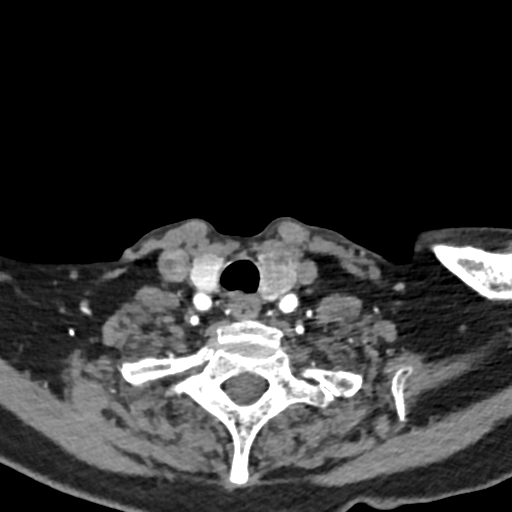
[im 300/766  soft-tissue]
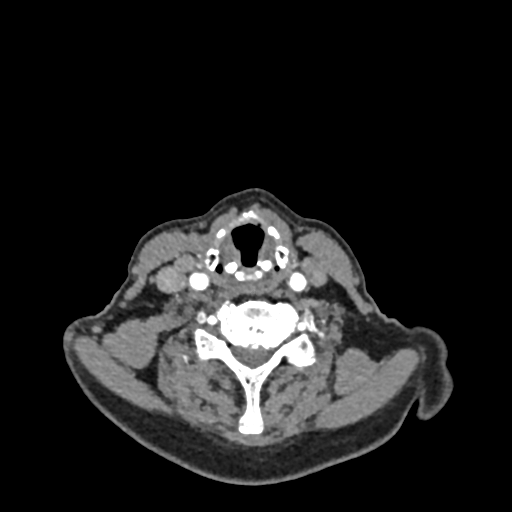
[im 366/766  soft-tissue]
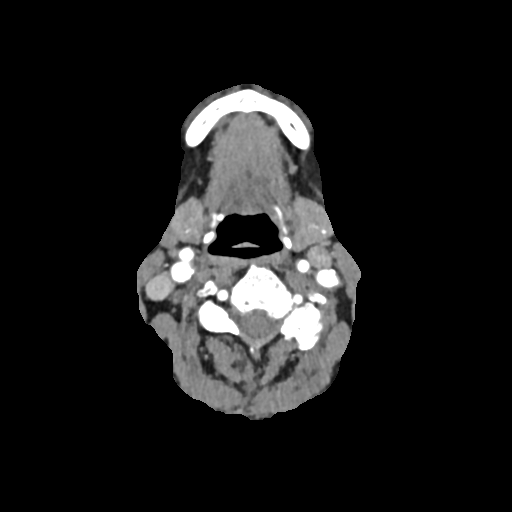
[im 400/766  soft-tissue]
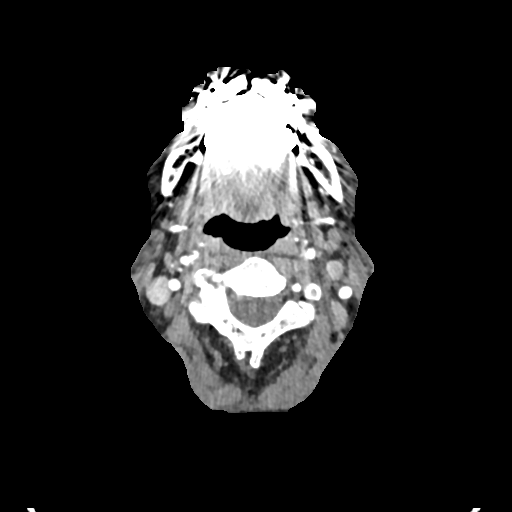
[im 466/766  soft-tissue]
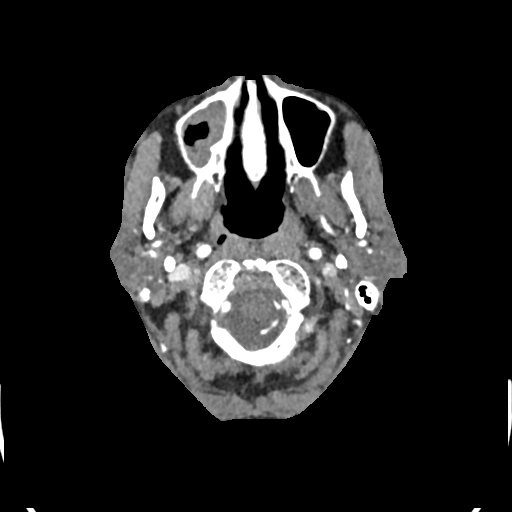
[im 533/766  soft-tissue]
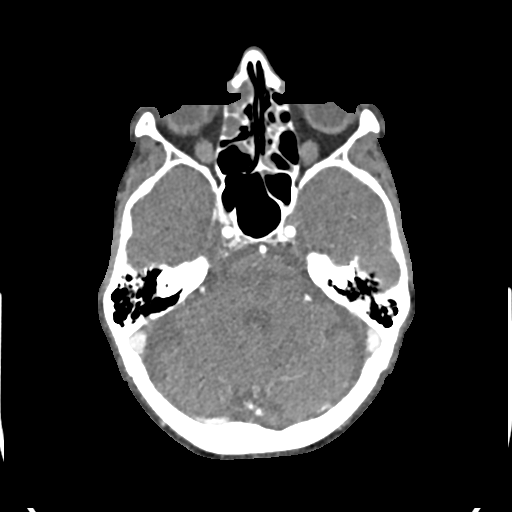
[im 533/766  bone]
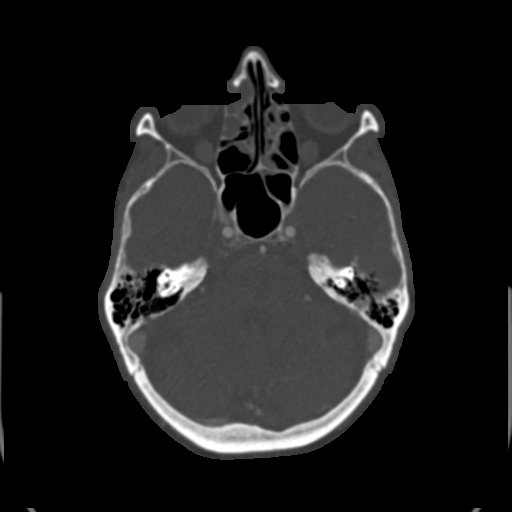
[im 599/766  soft-tissue]
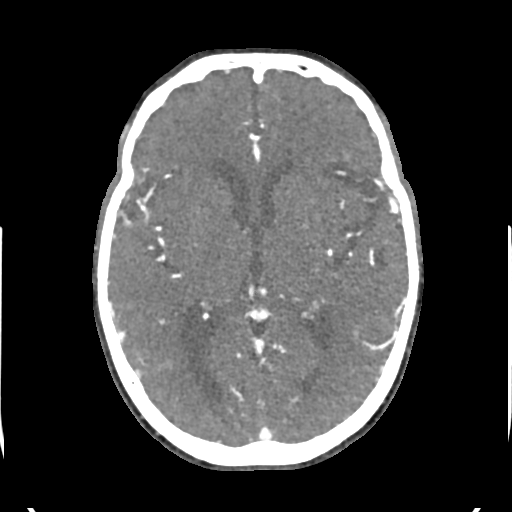
[im 632/766  lung]
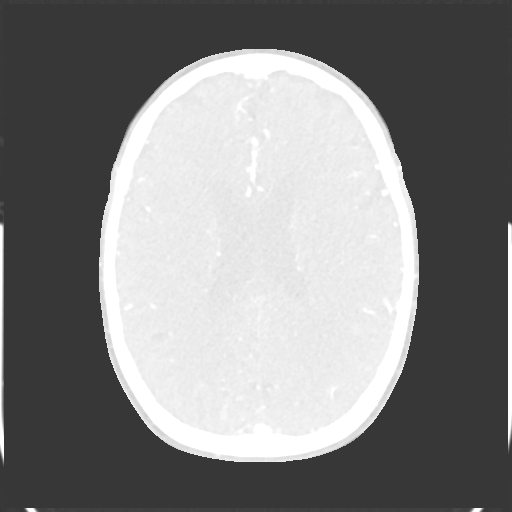
[im 666/766  soft-tissue]
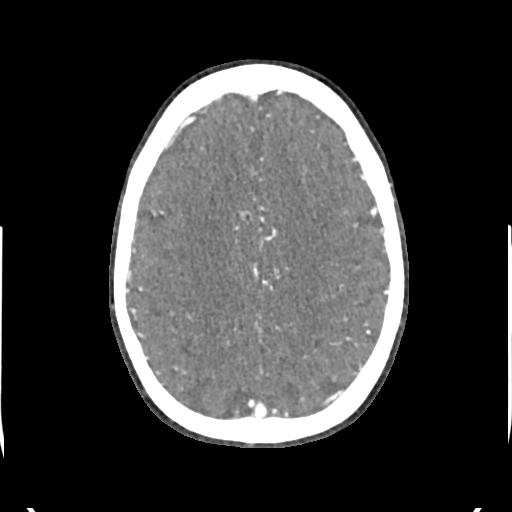
[im 666/766  lung]
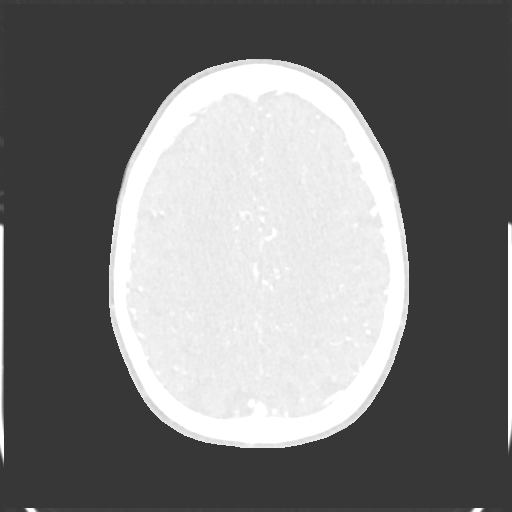
[im 699/766  lung]
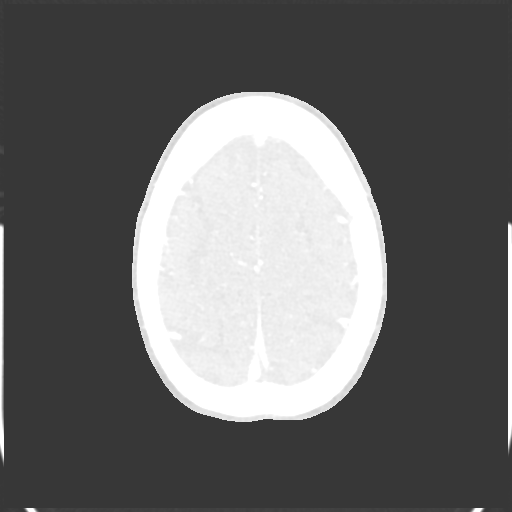
[im 732/766  soft-tissue]
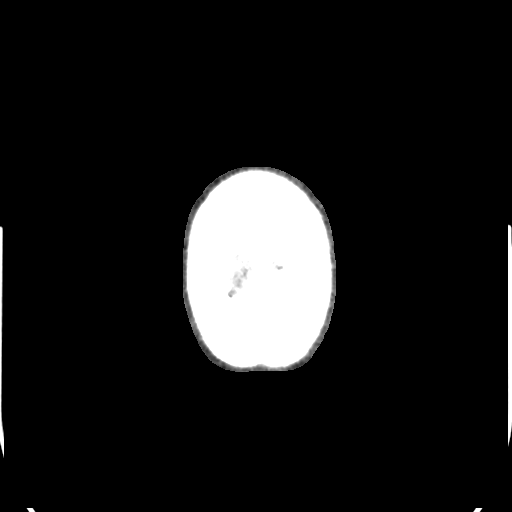
[im 732/766  lung]
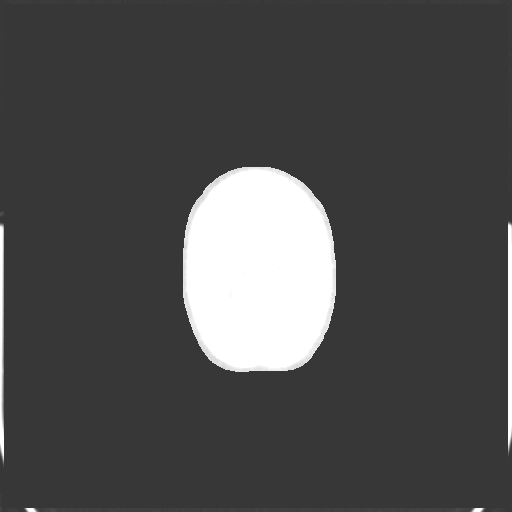

[14 of 46 positions shown; findings below may reference images not displayed]

FINDINGS: CTA NECK FINDINGS

Aortic arch: Standard branching. Imaged portion shows no evidence of
aneurysm or dissection. No significant stenosis of the major arch
vessel origins. Atherosclerotic calcification aortic arch

Right carotid system: Minimal atherosclerotic disease right carotid
bifurcation without stenosis. Negative for dissection

Left carotid system: Mild atherosclerotic disease left carotid
bifurcation without stenosis. Negative for dissection

Vertebral arteries: Mild calcific stenosis of the origin of both
vertebral arteries. Both vertebral arteries with and patent to the
basilar without additional stenosis.

Skeleton: Cervical spondylosis.  No acute skeletal abnormality.

Other neck: Left thyroid nodule 17 mm. 9 mm right thyroid nodule. No
adenopathy in the neck.

Upper chest: Apically scarring bilaterally.  No acute abnormality.

Review of the MIP images confirms the above findings

CTA HEAD FINDINGS

Anterior circulation: Mild atherosclerotic calcification in the
cavernous carotid bilaterally without significant stenosis. Anterior
and middle cerebral arteries patent bilaterally without significant
stenosis.

Posterior circulation: Both vertebral arteries patent to the
basilar. PICA patent bilaterally. Basilar widely patent. AICA,
superior cerebellar, and posterior cerebral arteries patent
bilaterally without stenosis. Fetal origin left posterior cerebral
artery.

Venous sinuses: Normal venous enhancement

Anatomic variants: None

Review of the MIP images confirms the above findings
IMPRESSION: 1. No significant carotid  artery stenosis in the neck
2. Mild stenosis of the origin of the vertebral artery bilaterally.
3. Negative for emergent large vessel occlusion. No significant
intracranial stenosis
4. 17 mm left thyroid nodule. Recommend follow-up thyroid ultrasound
for further evaluation.

## 2020-10-20 IMAGING — MR MR HEAD W/O CM
9 of 10 series · 34 of 48 positions shown · IV contrast (Yes MH)
Comparison: Head CT earlier today.  Brain MRI 10/02/2015.

CLINICAL DATA: 84-year-old female with left upper extremity
heaviness, difficulty holding things in her hand. Headache on
presentation, now resolved.

EXAM:
MRI HEAD WITHOUT CONTRAST
TECHNIQUE: Multiplanar, multiecho pulse sequences of the brain and surrounding
structures were obtained without intravenous contrast.

[Series 2: DWI · axial · 3.0mm · 0.94mm/px · z∈[-66,+86]mm · 8 of 104 slices shown (1 of 2)]
[im 1/104]
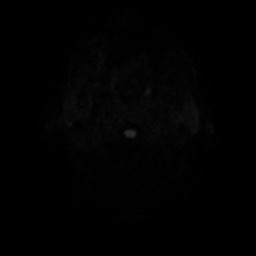
[im 12/104]
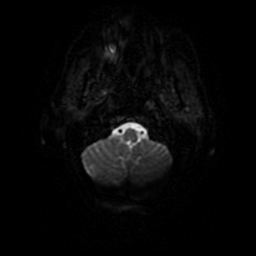
[im 35/104]
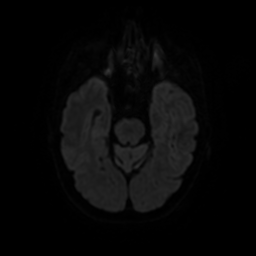
[im 46/104]
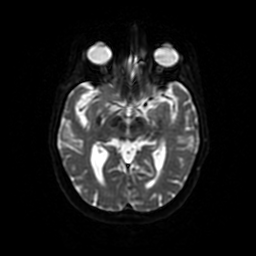
[im 58/104]
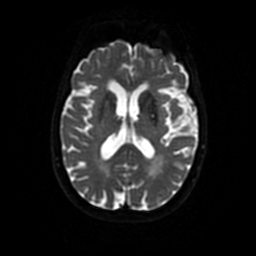
[im 69/104]
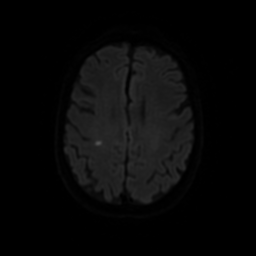
[im 92/104]
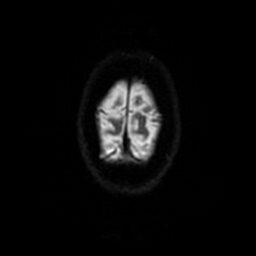
[im 104/104]
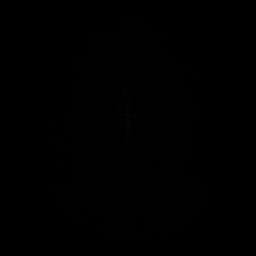

[Series 3: DWI · coronal · 4.0mm · 0.94mm/px · 7 of 74 slices shown (2 of 2)]
[im 1/74]
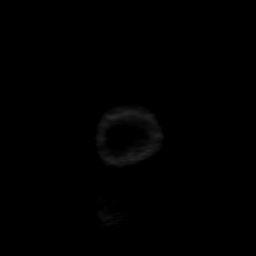
[im 13/74]
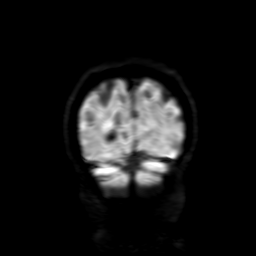
[im 25/74]
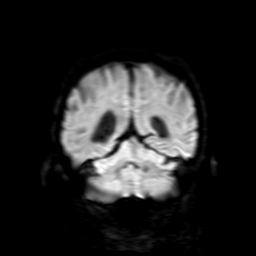
[im 37/74]
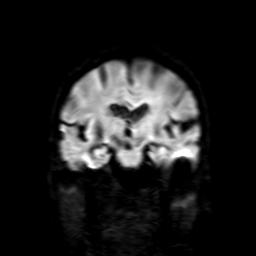
[im 49/74]
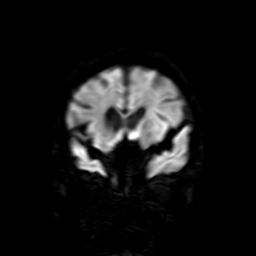
[im 61/74]
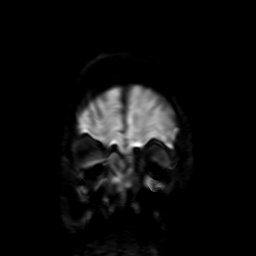
[im 74/74]
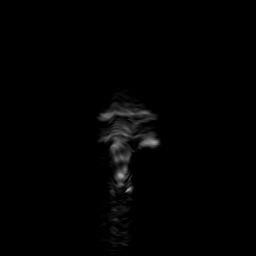

[Series 4: FLAIR · sagittal · 5.0mm · 0.47mm/px · 2 of 25 slices shown (1 of 2)]
[im 1/25]
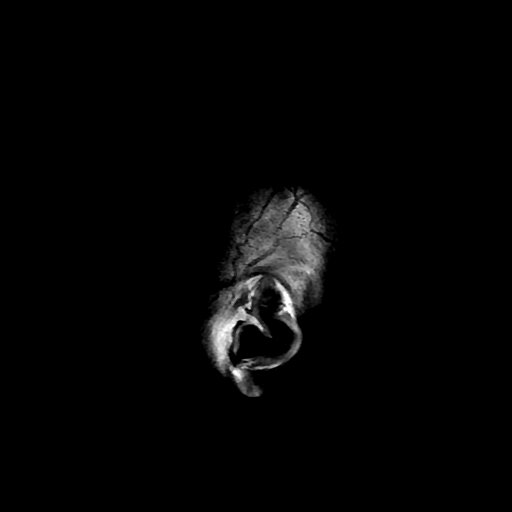
[im 25/25]
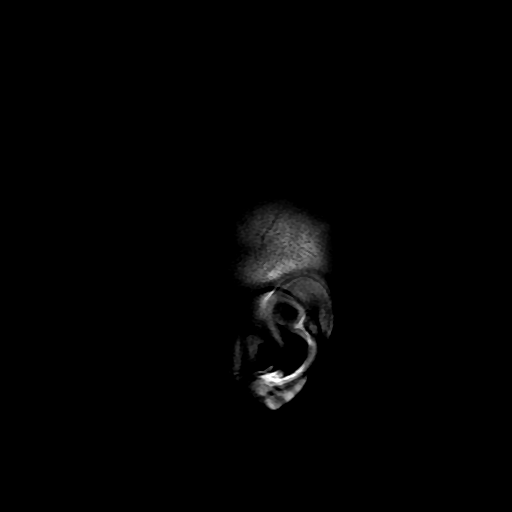

[Series 5: T2 · axial · 5.0mm · 0.47mm/px · z∈[-68,+88]mm · 2 of 27 slices shown (1 of 2)]
[im 1/27]
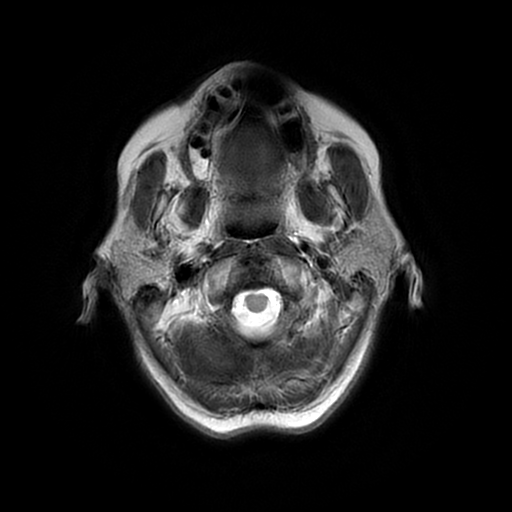
[im 27/27]
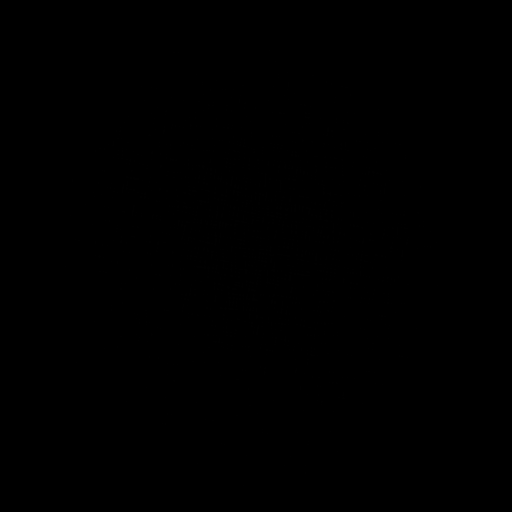

[Series 6: FLAIR · axial · 3.0mm · 0.45mm/px · z∈[-59,+90]mm · 2 of 26 slices shown (2 of 2)]
[im 1/26]
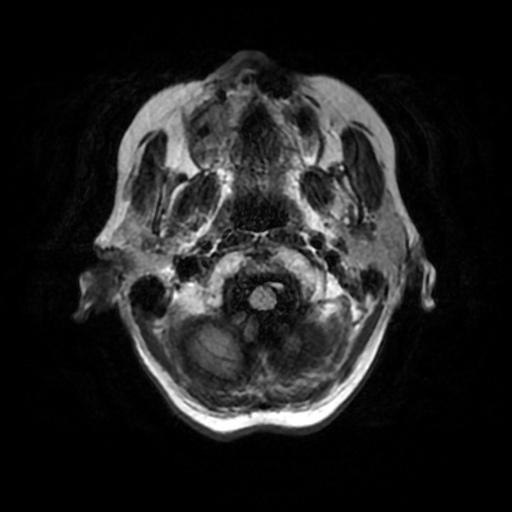
[im 26/26]
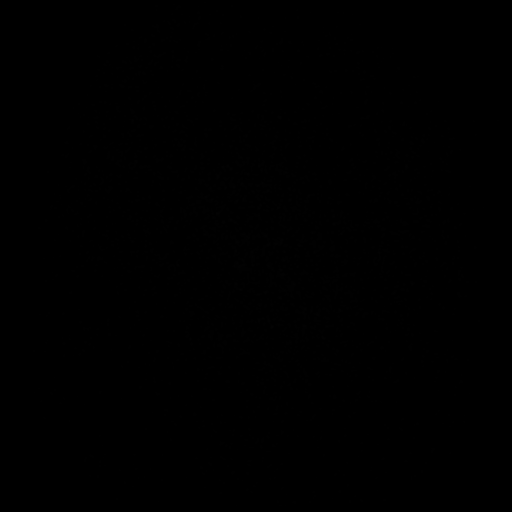

[Series 7: SWI · axial · 3.0mm · 0.47mm/px · z∈[-51,-33]mm · 2 of 100 slices shown]
[im 1/100]
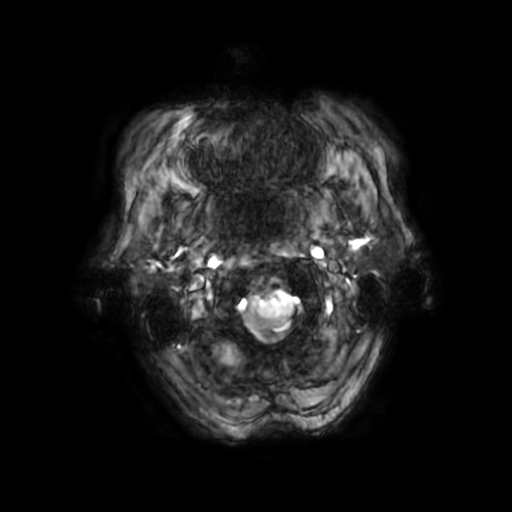
[im 13/100]
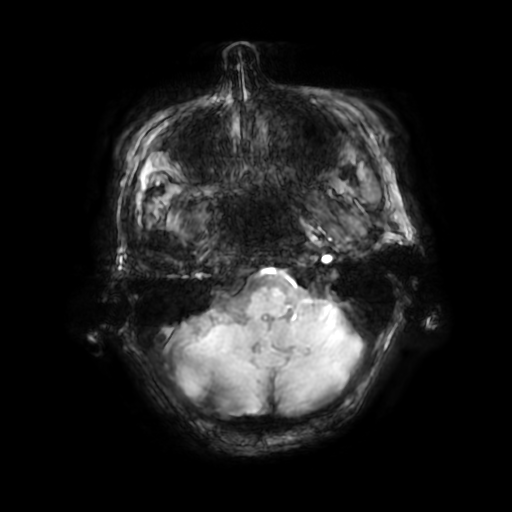

[Series 9: T2 · coronal · 5.0mm · 0.39mm/px · 3 of 32 slices shown (2 of 2)]
[im 1/32]
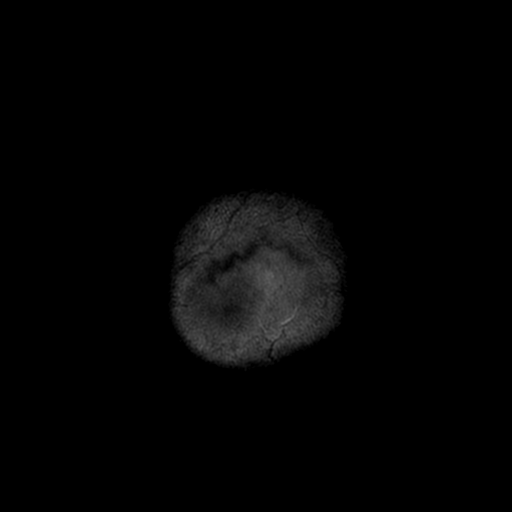
[im 16/32]
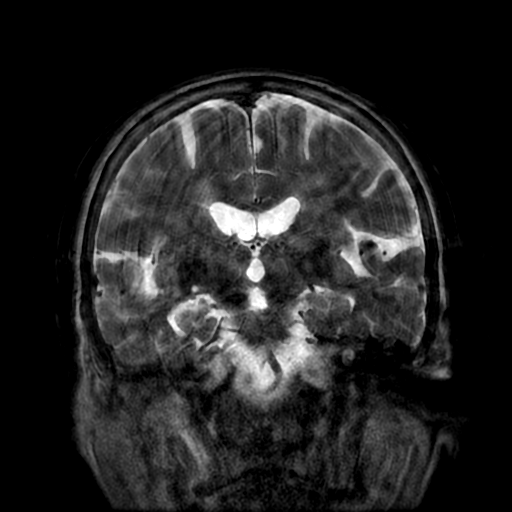
[im 32/32]
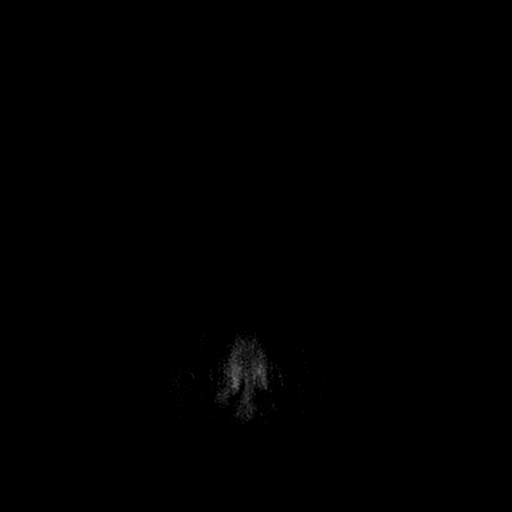

[Series 250: ADC · axial · 3.0mm · 0.94mm/px · z∈[-66,+86]mm · 5 of 52 slices shown (1 of 2)]
[im 1/52]
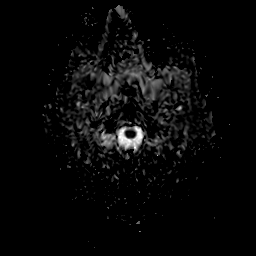
[im 13/52]
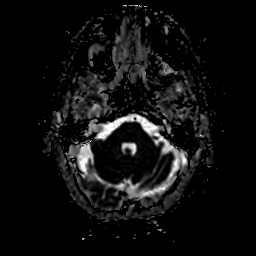
[im 26/52]
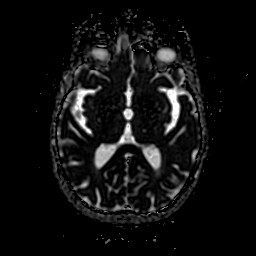
[im 39/52]
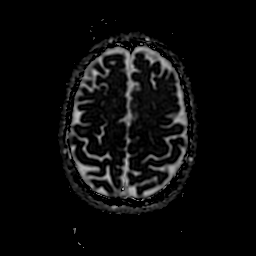
[im 52/52]
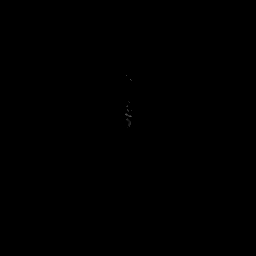

[Series 350: ADC · coronal · 4.0mm · 0.94mm/px · 3 of 37 slices shown (2 of 2)]
[im 1/37]
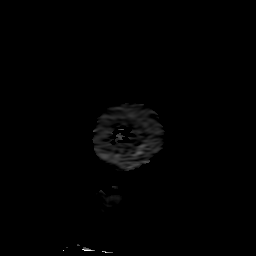
[im 19/37]
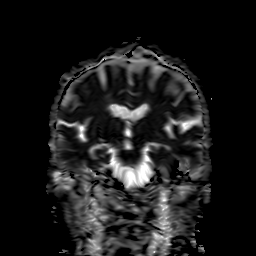
[im 37/37]
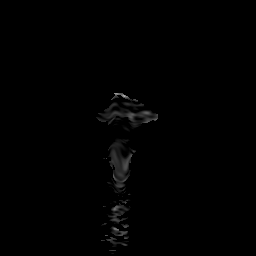

[34 of 48 positions shown; findings below may reference images not displayed]

FINDINGS: Brain: Small linear 7 millimeter focus of restricted diffusion in
the posterior right centrum semiovale near the motor strip (series
2, image 35). Faint associated T2 and FLAIR hyperintensity. No
associated hemorrhage or mass effect.

No other restricted diffusion. No midline shift, mass effect,
evidence of mass lesion, ventriculomegaly, extra-axial collection or
acute intracranial hemorrhage. Cervicomedullary junction and
pituitary are within normal limits.

Multiple chronic lacunar infarcts in both thalami appear similar to
the 8802 exam. Scattered small chronic infarcts in both cerebellar
hemispheres are stable. Superimposed periventricular bilateral
cerebral white matter T2 and FLAIR hyperintensity which also
resembles small chronic infarcts on series 6, image 17.

No cortical encephalomalacia. No definite chronic blood products.
The basal ganglia and brainstem remain normal for age.

Vascular: Major intracranial vascular flow voids are stable since
8802.

Skull and upper cervical spine: Mild for age. Normal bone marrow
disc degeneration in the visible cervical spine signal.

Sinuses/Orbits: Stable orbits. Increased mild to moderate paranasal
sinus mucosal thickening.

Other: Mastoids remain clear.  Negative scalp and face soft tissues.
IMPRESSION: 1. Small acute lacunar infarct in the posterior right centrum
semiovale near the motor strip. No associated hemorrhage or mass
effect.
2. Moderately advanced chronic small vessel ischemia, that the
thalami and cerebellum is stable since [DATE].

## 2020-10-21 ENCOUNTER — Ambulatory Visit: Payer: PPO

## 2020-10-21 ENCOUNTER — Other Ambulatory Visit: Payer: Self-pay

## 2020-10-21 DIAGNOSIS — R41841 Cognitive communication deficit: Secondary | ICD-10-CM

## 2020-10-21 DIAGNOSIS — R4701 Aphasia: Secondary | ICD-10-CM

## 2020-10-21 NOTE — Patient Instructions (Signed)
   Keep track if any coughing episodes occur. Write down any occurrence.   Download TalkPath (free) & Constant Therapy (14 free day trial)   Read some simple books or household items   Practice naming items in your house

## 2020-10-21 NOTE — Therapy (Signed)
[facility] [address] [place], [place], [address] Phone: [phone]   Fax:  [phone]  Speech Language Pathology Treatment  Patient Details  Name: Crystal Day MRN: [id number] Date of Birth: July 11, 1935 Referring Provider (SLP): Ref: Benjiman Core PA (Neuro: Arlice Colt MD)   Encounter Date: 10/21/2020   End of Session - 10/21/20 1633    Visit Number 4    Number of Visits 17    Date for SLP Re-Evaluation 01/08/21    SLP Start Time 4742    SLP Stop Time  1630    SLP Time Calculation (min) 54 min    Activity Tolerance Patient tolerated treatment well           Past Medical History:  Diagnosis Date  . Anginal pain ([place]) since 2014  . Arthritis    just in the hands  . Cancer ([place]) 1988 or 89   basal cell carcinoma on face s/p Mohs procedure  . Complication of anesthesia    nausea  . History of wheezing    in spring and fall  . Hyperlipidemia   . Hypertension   . Non-obstructive CAD    a. 09/2013 Cath/NSTEMI: nonobs dzs.  . Non-STEMI (non-ST elevated myocardial infarction) ([place])    a. 09/2013 - peak trop 5.38;  b. 09/2013 Cath: nonobs dzs-->Med Rx.  . Pneumonia    "in my 20's"  . PONV (postoperative nausea and vomiting)   . Stroke [facility]) 2009   a. 09/2015 L Thalamic Lacunar infarct, presumed to be 2/2 small vessel dzs, residuals include numbness on right side of lips and right finger tips; b. 09/2015 Echo: nl LV fxn; c. 09/2015 Carotid U/S: mild bilat ICA stenosis.    Past Surgical History:  Procedure Laterality Date  . ABDOMINAL HYSTERECTOMY  1974  . BREAST SURGERY Left 1976   lumpectomy  . EYE SURGERY     cataract surgery, both eyes  . FRACTURE SURGERY Right 1971   thumb and index finger  . LEFT HEART CATHETERIZATION WITH CORONARY ANGIOGRAM N/A 09/29/2013   Procedure: LEFT HEART CATHETERIZATION WITH CORONARY ANGIOGRAM;  Surgeon: Peter M Martinique, MD;  Location: [facility] CATH LAB;  Service: Cardiovascular;   Laterality: N/A;  . PARTIAL KNEE ARTHROPLASTY Right 12/31/2014   Procedure: RIGHT KNEE MEDIAL UNICOMPARTMENTAL ARTHROPLASTY;  Surgeon: Gaynelle Arabian, MD;  Location: [facility] ORS;  Service: Orthopedics;  Laterality: Right;    There were no vitals filed for this visit.   Subjective Assessment - 10/21/20 1537    Subjective "nothing" re: weekend plans    Patient is accompained by: Family member   Crystal Day   Currently in Pain? No/denies                 ADULT SLP TREATMENT - 10/21/20 1538      General Information   Behavior/Cognition Alert;Cooperative      Treatment Provided   Treatment provided Cognitive-Linquistic      Cognitive-Linquistic Treatment   Treatment focused on Aphasia;Cognition    Skilled Treatment Pt completed HEP with minimal A from daughter. Occasional errors noted for naming opposites. SLP educated use of multimodal communication, gestures, and descriptions for word finding compensations. SLP cued caregiver on how to allow patient to process cues prior to providing next cues. First letters and phoemic cues were effective this session. Caregiver reports increased frustration and anxiety since stroke (ex: pt reported pt called ambulance after unable to swallow multivitamin- SLP educated pt and caregiver on monitoring swallowing to determine  if insturmental referral is warranted). SLP educated caregiver on writing a list of options to provide to patient if frustration occurs.      Assessment / Recommendations / Plan   Plan Continue with current plan of care      Progression Toward Goals   Progression toward goals Progressing toward goals            SLP Education - 10/21/20 1613    Education Details multimodal communication, TalkPath/Constant Therapy    Person(s) Educated Patient;Child(ren)    Methods Explanation;Demonstration;Handout    Comprehension Verbalized understanding;Returned demonstration;Need further instruction            SLP Short Term Goals - 10/21/20  1636      SLP SHORT TERM GOAL #1   Title Pt will demonstrate use of word finding compensations in simple 5 minute conversation with min A over 2 sessions    Time 3    Period Weeks    Status On-going      SLP SHORT TERM GOAL #2   Title Pt's caregiver will appropriately cue patient when word finding episodes occur with rare min A over 2 sessions    Time 3    Period Weeks    Status On-going      SLP SHORT TERM GOAL #3   Title Pt will generate functional sentences to improve communication of basic wants/needs with min A over 2 sessions    Time 3    Period Weeks    Status On-going      SLP SHORT TERM GOAL #4   Title Pt will complete formal cognitive linguistic assessment in first 2 sessions    Status Achieved      SLP SHORT TERM GOAL #5   Title Pt will carryover external aids to recall to-do list, appointments and pertinent information with occasional min A over 3 sessions    Time 3    Period Weeks    Status On-going            SLP Long Term Goals - 10/21/20 1636      SLP LONG TERM GOAL #1   Title Pt will use word finding compensations in 10 to 15 minute mod complex conversation with rare min A over 2 sessions    Time 7    Period Weeks    Status On-going      SLP LONG TERM GOAL #2   Title Pt will generate functional sentences to improve communication of complex wants/needs with min A over 2 sessions    Time 7    Period Weeks    Status On-going      SLP LONG TERM GOAL #3   Title Pt will carryover external aids to recall to-do list, appointments and pertinent information with rare min A over 3 sessions    Time 7    Period Weeks    Status On-going            Plan - 10/21/20 1634    Clinical Impression Statement Crystal Day was referred for OPST evaluation to address cognitive communication following recent CVA. SLP reviewed word finding compensations with patient and caregiver given increased difficulty naming and word finding in conversation. Increased processing time  required for simple naming tasks. Pt benefitted from semantic cues, first letters, and phonemic cues. SLP provided HEP and recommendations with patient and daughter to reduce communication breakdown and frustration. 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    Speech Therapy Frequency 2x / week    Duration 8 weeks    Treatment/Interventions Compensatory strategies;Patient/family education;Functional tasks;Cueing hierarchy;Multimodal communcation approach;Cognitive reorganization;Compensatory techniques;Internal/external aids;SLP instruction and feedback    Potential to Achieve Goals Good    Potential Considerations Co-morbidities    SLP Home Exercise Plan provided    Consulted and Agree with Plan of Care Patient;Family member/caregiver           Patient will benefit from skilled therapeutic intervention in order to improve the following deficits and impairments:   Aphasia  Cognitive communication deficit    Problem List Patient Active Problem List   Diagnosis Date Noted  . Vitamin D deficiency 09/30/2020  . CVA (cerebral vascular accident) ([place]) 04/26/2019  . Non-STEMI (non-ST elevated myocardial infarction) ([place])   . Non-obstructive CAD   . Hypertension   . TIA (transient ischemic attack)   . Lacunar infarct, acute ([place]) 10/03/2015  . CKD (chronic kidney disease) stage 3, GFR 30-59 ml/min ([facility]) 10/03/2015  . Acute stroke due to ischemia ([place])   . HLD (hyperlipidemia)   . OA (osteoarthritis) of knee 12/31/2014  . Syncope 09/28/2014  . Hyperlipidemia 04/17/2014  . Atherosclerosis of native coronary artery of native heart without angina pectoris 10/05/2013  . NSTEMI (non-ST elevated myocardial infarction) ([place]) 09/29/2013  . Chest pain 09/28/2013  . Essential hypertension 09/28/2013  . Acute renal failure ([place])  09/28/2013    Alinda Deem, MA CCC-SLP 10/21/2020, 4:37 PM  [facility] [address] [place], [place], [address] Phone: [phone]   Fax:  [phone]   Name: Crystal Day MRN: [id number] Date of Birth: [birth year]

## 2020-10-24 ENCOUNTER — Ambulatory Visit: Payer: PPO

## 2020-10-25 ENCOUNTER — Ambulatory Visit: Payer: PPO

## 2020-10-25 ENCOUNTER — Other Ambulatory Visit: Payer: Self-pay

## 2020-10-25 DIAGNOSIS — R4701 Aphasia: Secondary | ICD-10-CM

## 2020-10-25 DIAGNOSIS — R41841 Cognitive communication deficit: Secondary | ICD-10-CM

## 2020-10-25 NOTE — Therapy (Signed)
[facility] [address] [place], [place], [address] Phone: [phone]   Fax:  [phone]  Speech Language Pathology Treatment  Patient Details  Name: Crystal Day MRN: [id number] Date of Birth: 1934-09-18 Referring Provider (SLP): Ref: Benjiman Core PA (Neuro: Arlice Colt MD)   Encounter Date: 10/25/2020   End of Session - 10/25/20 1245    Visit Number 5    Number of Visits 17    Date for SLP Re-Evaluation 01/08/21    SLP Start Time 1100    SLP Stop Time  1146    SLP Time Calculation (min) 46 min    Activity Tolerance Patient tolerated treatment well           Past Medical History:  Diagnosis Date  . Anginal pain ([place]) since 2014  . Arthritis    just in the hands  . Cancer ([place]) 1988 or 89   basal cell carcinoma on face s/p Mohs procedure  . Complication of anesthesia    nausea  . History of wheezing    in spring and fall  . Hyperlipidemia   . Hypertension   . Non-obstructive CAD    a. 09/2013 Cath/NSTEMI: nonobs dzs.  . Non-STEMI (non-ST elevated myocardial infarction) ([place])    a. 09/2013 - peak trop 5.38;  b. 09/2013 Cath: nonobs dzs-->Med Rx.  . Pneumonia    "in my 20's"  . PONV (postoperative nausea and vomiting)   . Stroke [facility]) 2009   a. 09/2015 L Thalamic Lacunar infarct, presumed to be 2/2 small vessel dzs, residuals include numbness on right side of lips and right finger tips; b. 09/2015 Echo: nl LV fxn; c. 09/2015 Carotid U/S: mild bilat ICA stenosis.    Past Surgical History:  Procedure Laterality Date  . ABDOMINAL HYSTERECTOMY  1974  . BREAST SURGERY Left 1976   lumpectomy  . EYE SURGERY     cataract surgery, both eyes  . FRACTURE SURGERY Right 1971   thumb and index finger  . LEFT HEART CATHETERIZATION WITH CORONARY ANGIOGRAM N/A 09/29/2013   Procedure: LEFT HEART CATHETERIZATION WITH CORONARY ANGIOGRAM;  Surgeon: Peter M Martinique, MD;  Location: [facility] CATH LAB;  Service: Cardiovascular;   Laterality: N/A;  . PARTIAL KNEE ARTHROPLASTY Right 12/31/2014   Procedure: RIGHT KNEE MEDIAL UNICOMPARTMENTAL ARTHROPLASTY;  Surgeon: Gaynelle Arabian, MD;  Location: [facility] ORS;  Service: Orthopedics;  Laterality: Right;    There were no vitals filed for this visit.   Subjective Assessment - 10/25/20 1102    Subjective "It's been rainy"    Patient is accompained by: Family member   Mariea Clonts   Currently in Pain? No/denies                 ADULT SLP TREATMENT - 10/25/20 0001      General Information   Behavior/Cognition Alert;Cooperative;Pleasant mood      Treatment Provided   Treatment provided Cognitive-Linquistic      Cognitive-Linquistic Treatment   Treatment focused on Aphasia;Cognition;Patient/family/caregiver education    Skilled Treatment SLP targeted functional naming of personally relevant information that pt has difficulty verbalizing independently, including favorite restaurants and family members. Pt's son Mariea Clonts present this session, who provided usual Mod A for naming. SLP educated caregiver on different naming strategies, including providing first letter, first sound, carrier phrases, descriptions, and visual cues for underlining to prompt patient. Mod to max cues were required for naming of 10 favorite items throughout session. SLP recommended family add to created list to aid  communication and reduce frustration.      Assessment / Recommendations / Plan   Plan Continue with current plan of care      Progression Toward Goals   Progression toward goals Progressing toward goals            SLP Education - 10/25/20 1244    Education Details written favorites list for naming and to reduce frustration    Person(s) Educated Patient;Child(ren)    Methods Explanation;Demonstration;Handout    Comprehension Verbalized understanding;Returned demonstration;Need further instruction;Verbal cues required            SLP Short Term Goals - 10/25/20 1058      SLP SHORT TERM  GOAL #1   Title Pt will demonstrate use of word finding compensations in simple 5 minute conversation with min A over 2 sessions    Time 3    Period Weeks    Status On-going      SLP SHORT TERM GOAL #2   Title Pt's caregiver will appropriately cue patient when word finding episodes occur with rare min A over 2 sessions    Time 3    Period Weeks    Status On-going      SLP SHORT TERM GOAL #3   Title Pt will generate functional sentences to improve communication of basic wants/needs with min A over 2 sessions    Time 3    Period Weeks    Status On-going      SLP SHORT TERM GOAL #4   Title Pt will complete formal cognitive linguistic assessment in first 2 sessions    Status Achieved      SLP SHORT TERM GOAL #5   Title Pt will carryover external aids to recall to-do list, appointments and pertinent information with occasional min A over 3 sessions    Time 3    Period Weeks    Status On-going            SLP Long Term Goals - 10/25/20 1058      SLP LONG TERM GOAL #1   Title Pt will use word finding compensations in 10 to 15 minute mod complex conversation with rare min A over 2 sessions    Time 7    Period Weeks    Status On-going      SLP LONG TERM GOAL #2   Title Pt will generate functional sentences to improve communication of complex wants/needs with min A over 2 sessions    Time 7    Period Weeks    Status On-going      SLP LONG TERM GOAL #3   Title Pt will carryover external aids to recall to-do list, appointments and pertinent information with rare min A over 3 sessions    Time 7    Period Weeks    Status On-going            Plan - 10/25/20 [place] was referred for OPST evaluation to address cognitive communication following recent CVA. SLP reviewed word finding compensations with patient and son Mariea Clonts) given pt has increased difficulty naming and word finding in conversation. SLP created written list of favorite  places/items to use if naming difficulty occurs or frustration arises. Increased processing time required for simple naming tasks. Pt benefitted from semantic cues, first letters, phonemic cues, visual cues, and carrier phrases with inconsistent performance with targeted strategies. SLP provided HEP and recommendations with patient and son to add words to list to  reduce communication breakdown and frustration. 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    Speech Therapy Frequency 2x / week    Duration 8 weeks   or 17 total visits   Treatment/Interventions Compensatory strategies;Patient/family education;Functional tasks;Cueing hierarchy;Multimodal communcation approach;Cognitive reorganization;Compensatory techniques;Internal/external aids;SLP instruction and feedback    Potential Considerations Co-morbidities    SLP Home Exercise Plan provided    Consulted and Agree with Plan of Care Patient;Family member/caregiver           Patient will benefit from skilled therapeutic intervention in order to improve the following deficits and impairments:   Aphasia  Cognitive communication deficit    Problem List Patient Active Problem List   Diagnosis Date Noted  . Vitamin D deficiency 09/30/2020  . CVA (cerebral vascular accident) ([place]) 04/26/2019  . Non-STEMI (non-ST elevated myocardial infarction) ([place])   . Non-obstructive CAD   . Hypertension   . TIA (transient ischemic attack)   . Lacunar infarct, acute ([place]) 10/03/2015  . CKD (chronic kidney disease) stage 3, GFR 30-59 ml/min ([facility]) 10/03/2015  . Acute stroke due to ischemia ([place])   . HLD (hyperlipidemia)   . OA (osteoarthritis) of knee 12/31/2014  . Syncope 09/28/2014  . Hyperlipidemia 04/17/2014  . Atherosclerosis of native coronary artery of native heart without angina  pectoris 10/05/2013  . NSTEMI (non-ST elevated myocardial infarction) ([place]) 09/29/2013  . Chest pain 09/28/2013  . Essential hypertension 09/28/2013  . Acute renal failure ([place]) 09/28/2013    Alinda Deem, MA CCC-SLP 10/25/2020, 12:48 PM  [facility] [address] [place], [place], [address] Phone: [phone]   Fax:  [phone]   Name: DERRISHA FOOS MRN: [id number] Date of Birth: 1935/03/14

## 2020-10-28 ENCOUNTER — Ambulatory Visit: Payer: PPO

## 2020-10-28 ENCOUNTER — Other Ambulatory Visit: Payer: Self-pay

## 2020-10-28 DIAGNOSIS — R4701 Aphasia: Secondary | ICD-10-CM | POA: Diagnosis not present

## 2020-10-28 DIAGNOSIS — R41841 Cognitive communication deficit: Secondary | ICD-10-CM

## 2020-10-28 NOTE — Therapy (Signed)
[facility] [address] [place], [place], [address] Phone: [phone]   Fax:  [phone]  Speech Language Pathology Treatment  Patient Details  Name: Crystal Day MRN: [id number] Date of Birth: [birth year] Referring Provider (SLP): Ref: Benjiman Core PA (Neuro: Arlice Colt MD)   Encounter Date: 10/28/2020   End of Session - 10/28/20 1439    Visit Number 6    Number of Visits 17    Date for SLP Re-Evaluation 01/08/21    SLP Start Time 2500    SLP Stop Time  1230    SLP Time Calculation (min) 45 min    Activity Tolerance Patient tolerated treatment well           Past Medical History:  Diagnosis Date  . Anginal pain ([place]) since 2014  . Arthritis    just in the hands  . Cancer ([place]) 1988 or 89   basal cell carcinoma on face s/p Mohs procedure  . Complication of anesthesia    nausea  . History of wheezing    in spring and fall  . Hyperlipidemia   . Hypertension   . Non-obstructive CAD    a. 09/2013 Cath/NSTEMI: nonobs dzs.  . Non-STEMI (non-ST elevated myocardial infarction) ([place])    a. 09/2013 - peak trop 5.38;  b. 09/2013 Cath: nonobs dzs-->Med Rx.  . Pneumonia    "in my 20's"  . PONV (postoperative nausea and vomiting)   . Stroke [facility]) 2009   a. 09/2015 L Thalamic Lacunar infarct, presumed to be 2/2 small vessel dzs, residuals include numbness on right side of lips and right finger tips; b. 09/2015 Echo: nl LV fxn; c. 09/2015 Carotid U/S: mild bilat ICA stenosis.    Past Surgical History:  Procedure Laterality Date  . ABDOMINAL HYSTERECTOMY  1974  . BREAST SURGERY Left 1976   lumpectomy  . EYE SURGERY     cataract surgery, both eyes  . FRACTURE SURGERY Right 1971   thumb and index finger  . LEFT HEART CATHETERIZATION WITH CORONARY ANGIOGRAM N/A 09/29/2013   Procedure: LEFT HEART CATHETERIZATION WITH CORONARY ANGIOGRAM;  Surgeon: Peter M Martinique, MD;  [facility] LAB;  Service: Cardiovascular;   Laterality: N/A;  . PARTIAL KNEE ARTHROPLASTY Right 12/31/2014   Procedure: RIGHT KNEE MEDIAL UNICOMPARTMENTAL ARTHROPLASTY;  Surgeon: Gaynelle Arabian, MD;  Location: [facility] ORS;  Service: Orthopedics;  Laterality: Right;    There were no vitals filed for this visit.   Subjective Assessment - 10/28/20 1146    Subjective "good"    Patient is accompained by: Family member   [name] and [place]   Currently in Pain? No/denies    Pain Score 0-No pain                 ADULT SLP TREATMENT - 10/28/20 1200      General Information   Behavior/Cognition Alert;Cooperative;Pleasant mood      Treatment Provided   Treatment provided Cognitive-Linquistic      Cognitive-Linquistic Treatment   Treatment focused on Aphasia;Cognition;Patient/family/caregiver education    Skilled Treatment Pt and caregivers completed additional naming tasks for list of common items to ease frustration and improve verbal output. Written and verbal perseverations exhibited. SLP targeted naming of additional personally relevant categories this session, in which pt noted with improved accuracy for naming given intial sound and carrier phrases. SLP educated daughter and granddaughter for rationale of written list to ease communication burden and frustration. SLP provided additional naming tasks for HEP.  Assessment / Recommendations / Plan   Plan Continue with current plan of care      Progression Toward Goals   Progression toward goals Progressing toward goals            SLP Education - 10/28/20 1438    Education Details written favorites list for naming, caregiver education of how to use list, sentence completion strategy    Person(s) Educated Patient;Caregiver(s)    Methods Explanation;Demonstration;Handout    Comprehension Verbalized understanding;Returned demonstration;Need further instruction            SLP Short Term Goals - 10/28/20 1439      SLP SHORT TERM GOAL #1   Title Pt will demonstrate use of  word finding compensations in simple 5 minute conversation with min A over 2 sessions    Time 2    Period Weeks    Status On-going      SLP SHORT TERM GOAL #2   Title Pt's caregiver will appropriately cue patient when word finding episodes occur with rare min A over 2 sessions    Baseline 10-28-20    Time 2    Period Weeks    Status On-going      SLP SHORT TERM GOAL #3   Title Pt will generate functional sentences to improve communication of basic wants/needs with min A over 2 sessions    Time 2    Period Weeks    Status On-going      SLP SHORT TERM GOAL #4   Title Pt will complete formal cognitive linguistic assessment in first 2 sessions    Status Achieved      SLP SHORT TERM GOAL #5   Title Pt will carryover external aids to recall to-do list, appointments and pertinent information with occasional min A over 3 sessions    Time 2    Period Weeks    Status On-going            SLP Long Term Goals - 10/28/20 1440      SLP LONG TERM GOAL #1   Title Pt will use word finding compensations in 10 to 15 minute mod complex conversation with rare min A over 2 sessions    Time 6    Period Weeks    Status On-going      SLP LONG TERM GOAL #2   Title Pt will generate functional sentences to improve communication of complex wants/needs with min A over 2 sessions    Time 6    Period Weeks    Status On-going      SLP LONG TERM GOAL #3   Title Pt will carryover external aids to recall to-do list, appointments and pertinent information with rare min A over 3 sessions    Time 6    Period Weeks    Status On-going            Plan - 10/28/20 1443    Clinical [place] was referred for OPST evaluation to address cognitive communication following recent CVA. SLP reviewed word finding compensations with patient and family given pt has increased difficulty naming and word finding in conversation. SLP added to written list of favorite places/items to use if naming  difficulty occurs or frustration arises. Decreased processing time required for simple naming tasks. Pt mostly benefitted from first letters, phonemic cues, and carrier phrases with inconsistent performance for other targeted strategies. SLP provided HEP and recommendations with patient and family to add words to list to reduce communication  breakdown and frustration. 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    Speech Therapy Frequency 2x / week    Duration 8 weeks    Treatment/Interventions Compensatory strategies;Patient/family education;Functional tasks;Cueing hierarchy;Multimodal communcation approach;Cognitive reorganization;Compensatory techniques;Internal/external aids;SLP instruction and feedback    Potential to Achieve Goals Good    Potential Considerations Co-morbidities    SLP Home Exercise Plan provided    Consulted and Agree with Plan of Care Patient;Family member/caregiver           Patient will benefit from skilled therapeutic intervention in order to improve the following deficits and impairments:   Aphasia  Cognitive communication deficit    Problem List Patient Active Problem List   Diagnosis Date Noted  . Vitamin D deficiency 09/30/2020  . CVA (cerebral vascular accident) ([place]) 04/26/2019  . Non-STEMI (non-ST elevated myocardial infarction) ([place])   . Non-obstructive CAD   . Hypertension   . TIA (transient ischemic attack)   . Lacunar infarct, acute ([facility]) 10/03/2015  . CKD (chronic kidney disease) stage 3, GFR 30-59 ml/min ([facility]) 10/03/2015  . Acute stroke due to ischemia ([place])   . HLD (hyperlipidemia)   . OA (osteoarthritis) of knee 12/31/2014  . Syncope 09/28/2014  . Hyperlipidemia 04/17/2014  . Atherosclerosis of native coronary artery of native heart without angina pectoris 10/05/2013  . NSTEMI  (non-ST elevated myocardial infarction) ([place]) 09/29/2013  . Chest pain 09/28/2013  . Essential hypertension 09/28/2013  . Acute renal failure ([place]) 09/28/2013    Alinda Deem, MA CCC-SLP 10/28/2020, 4:36 PM  [facility] [address] [place], [place], [address] Phone: [phone]   Fax:  [phone]   Name: ANADALAY MACDONELL MRN: [id number] Date of Birth: 07-28-1935

## 2020-10-30 ENCOUNTER — Ambulatory Visit: Payer: PPO

## 2020-10-30 ENCOUNTER — Other Ambulatory Visit: Payer: Self-pay

## 2020-10-30 DIAGNOSIS — R41841 Cognitive communication deficit: Secondary | ICD-10-CM

## 2020-10-30 DIAGNOSIS — R4701 Aphasia: Secondary | ICD-10-CM | POA: Diagnosis not present

## 2020-10-30 NOTE — Therapy (Signed)
[facility] [address] [place], [place], [address] Phone: [phone]   Fax:  [phone]  Speech Language Pathology Treatment  Patient Details  Name: Crystal Day MRN: [id number] Date of Birth: 1934/10/01 Referring Provider (SLP): Ref: Benjiman Core PA (Neuro: Arlice Colt MD)   Encounter Date: 10/30/2020   End of Session - 10/30/20 1422    Visit Number 7    Number of Visits 17    Date for SLP Re-Evaluation 01/08/21    SLP Start Time 0938    SLP Stop Time  1400    SLP Time Calculation (min) 45 min    Activity Tolerance Patient tolerated treatment well           Past Medical History:  Diagnosis Date  . Anginal pain ([place]) since 2014  . Arthritis    just in the hands  . Cancer ([place]) 1988 or 89   basal cell carcinoma on face s/p Mohs procedure  . Complication of anesthesia    nausea  . History of wheezing    in spring and fall  . Hyperlipidemia   . Hypertension   . Non-obstructive CAD    a. 09/2013 Cath/NSTEMI: nonobs dzs.  . Non-STEMI (non-ST elevated myocardial infarction) ([place])    a. 09/2013 - peak trop 5.38;  b. 09/2013 Cath: nonobs dzs-->Med Rx.  . Pneumonia    "in my 20's"  . PONV (postoperative nausea and vomiting)   . Stroke [facility]) 2009   a. 09/2015 L Thalamic Lacunar infarct, presumed to be 2/2 small vessel dzs, residuals include numbness on right side of lips and right finger tips; b. 09/2015 Echo: nl LV fxn; c. 09/2015 Carotid U/S: mild bilat ICA stenosis.    Past Surgical History:  Procedure Laterality Date  . ABDOMINAL HYSTERECTOMY  1974  . BREAST SURGERY Left 1976   lumpectomy  . EYE SURGERY     cataract surgery, both eyes  . FRACTURE SURGERY Right 1971   thumb and index finger  . LEFT HEART CATHETERIZATION WITH CORONARY ANGIOGRAM N/A 09/29/2013   Procedure: LEFT HEART CATHETERIZATION WITH CORONARY ANGIOGRAM;  Surgeon: Peter M Martinique, MD;  Location: [facility] CATH LAB;  Service: Cardiovascular;   Laterality: N/A;  . PARTIAL KNEE ARTHROPLASTY Right 12/31/2014   Procedure: RIGHT KNEE MEDIAL UNICOMPARTMENTAL ARTHROPLASTY;  Surgeon: Gaynelle Arabian, MD;  [facility];  Service: Orthopedics;  Laterality: Right;    There were no vitals filed for this visit.   Subjective Assessment - 10/30/20 1422    Subjective "busy"    Patient is accompained by: Family member    Currently in Pain? No/denies                 ADULT SLP TREATMENT - 10/30/20 1320      General Information   Behavior/Cognition Alert;Cooperative;Pleasant mood      Treatment Provided   Treatment provided Cognitive-Linquistic      Cognitive-Linquistic Treatment   Treatment focused on Aphasia;Cognition;Patient/family/caregiver education    Skilled Treatment SLP reviewed HEP for sentence completion, with pt able to identify 3 errors when read aloud. Pt able to self-correct with additional time x2 and written cue x1. SLP targeted naming given verbal description, which pt able to name with 63% accuracy independently given additional processing time. Accuracy improved to 94% accuracy with occasional semantic cues, usual first letter/sound, and occasional carrier phrases. SLP added additional category for compiled list of favorite items, in which pt able to verbalize favorite tv shows with usual  written and semantic cues. SLP targeted responsive naming for open-ended questions, which pt able to verbalize correct response with 64% accuracy, which improved to 100% acuracy with semantic cues and written cues. Overall improved word finding demonstrated in ST sessions and reported by caregiver, resulting in less frustration.      Assessment / Recommendations / Plan   Plan Continue with current plan of care      Progression Toward Goals   Progression toward goals Progressing toward goals            SLP Education - 10/30/20 1422    Education Details word finding compensations, using external aids for verbal communication     Person(s) Educated Patient;Caregiver(s)    Methods Explanation;Demonstration;Handout    Comprehension Verbalized understanding;Returned demonstration;Need further instruction            SLP Short Term Goals - 10/30/20 1423      SLP SHORT TERM GOAL #1   Title Pt will demonstrate use of word finding compensations in simple 5 minute conversation with min A over 2 sessions    Time 2    Period Weeks    Status On-going      SLP SHORT TERM GOAL #2   Title Pt's caregiver will appropriately cue patient when word finding episodes occur with rare min A over 2 sessions    Baseline 10-28-20, 10-30-20    Status Achieved      SLP SHORT TERM GOAL #3   Title Pt will generate functional sentences to improve communication of basic wants/needs with min A over 2 sessions    Time 2    Period Weeks    Status On-going      SLP SHORT TERM GOAL #4   Title Pt will complete formal cognitive linguistic assessment in first 2 sessions    Status Achieved      SLP SHORT TERM GOAL #5   Title Pt will carryover external aids to recall to-do list, appointments and pertinent information with occasional min A over 3 sessions    Time 2    Period Weeks    Status On-going            SLP Long Term Goals - 10/30/20 1423      SLP LONG TERM GOAL #1   Title Pt will use word finding compensations in 10 to 15 minute mod complex conversation with rare min A over 2 sessions    Time 6    Period Weeks    Status On-going      SLP LONG TERM GOAL #2   Title Pt will generate functional sentences to improve communication of complex wants/needs with min A over 2 sessions    Time 6    Period Weeks    Status On-going      SLP LONG TERM GOAL #3   Title Pt will carryover external aids to recall to-do list, appointments and pertinent information with rare min A over 3 sessions    Time 6    Period Weeks    Status On-going            Plan - 10/30/20 [place] was referred for OPST  evaluation to address cognitive communication following recent CVA. SLP reviewed word finding compensations with patient and family given pt has increased difficulty naming and word finding in conversation. Caregivers able to appropriately cue patient when needed given rare min A. SLP added to written list of favorite places/items to use  if naming difficulty occurs or frustration arises. Improvements noted for simple naming tasks, with decreased processing time and less cues required. Cuing has faded from max A to mod A since intial evaluation. 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    Speech Therapy Frequency 2x / week    Duration 8 weeks   or 17 total visits   Treatment/Interventions Compensatory strategies;Patient/family education;Functional tasks;Cueing hierarchy;Multimodal communcation approach;Cognitive reorganization;Compensatory techniques;Internal/external aids;SLP instruction and feedback    Potential to Achieve Goals Good    Potential Considerations Co-morbidities    SLP Home Exercise Plan provided    Consulted and Agree with Plan of Care Patient;Family member/caregiver           Patient will benefit from skilled therapeutic intervention in order to improve the following deficits and impairments:   Aphasia  Cognitive communication deficit    Problem List Patient Active Problem List   Diagnosis Date Noted  . Vitamin D deficiency 09/30/2020  . CVA (cerebral vascular accident) ([place]) 04/26/2019  . Non-STEMI (non-ST elevated myocardial infarction) ([place])   . Non-obstructive CAD   . Hypertension   . TIA (transient ischemic attack)   . Lacunar infarct, acute ([place]) 10/03/2015  . CKD (chronic kidney disease) stage 3, GFR 30-59 ml/min ([facility]) 10/03/2015  . Acute stroke due to ischemia ([place])   . HLD (hyperlipidemia)   .  OA (osteoarthritis) of knee 12/31/2014  . Syncope 09/28/2014  . Hyperlipidemia 04/17/2014  . Atherosclerosis of native coronary artery of native heart without angina pectoris 10/05/2013  . NSTEMI (non-ST elevated myocardial infarction) ([place]) 09/29/2013  . Chest pain 09/28/2013  . Essential hypertension 09/28/2013  . Acute renal failure ([place]) 09/28/2013    Alinda Deem, MA CCC-SLP 10/30/2020, 2:26 PM  [facility] [address] [place], [place], [address] Phone: [phone]   Fax:  [phone]   Name: Crystal Day MRN: [id number] Date of Birth: [birth year]

## 2020-11-04 ENCOUNTER — Ambulatory Visit: Payer: PPO

## 2020-11-04 ENCOUNTER — Ambulatory Visit: Payer: PPO | Admitting: Neurology

## 2020-11-04 ENCOUNTER — Other Ambulatory Visit: Payer: Self-pay

## 2020-11-04 ENCOUNTER — Encounter: Payer: Self-pay | Admitting: Neurology

## 2020-11-04 VITALS — BP 162/80 | HR 55 | Ht 69.0 in | Wt 144.5 lb

## 2020-11-04 DIAGNOSIS — I10 Essential (primary) hypertension: Secondary | ICD-10-CM

## 2020-11-04 DIAGNOSIS — R4701 Aphasia: Secondary | ICD-10-CM

## 2020-11-04 DIAGNOSIS — R413 Other amnesia: Secondary | ICD-10-CM

## 2020-11-04 DIAGNOSIS — R41841 Cognitive communication deficit: Secondary | ICD-10-CM

## 2020-11-04 DIAGNOSIS — I639 Cerebral infarction, unspecified: Secondary | ICD-10-CM | POA: Diagnosis not present

## 2020-11-04 NOTE — Progress Notes (Signed)
[facility]  PATIENT: Crystal Day DOB: 1935-03-17  REFERRING DOCTOR OR PCP: Crystal Ada, MD SOURCE: Patient, daughter, lab tests, MRI results, MRI images personally reviewed  _________________________________   HISTORICAL  CHIEF COMPLAINT:  Chief Complaint  Patient presents with  . Follow-up    RM 13. Last seen 09/24/2020.  No new sx. No falls.    HISTORY OF PRESENT ILLNESS:  Crystal Day is a 86 y.o. woman with cognitive changes found to have an acute stroke  Update 11/04/2020 MRI brain 09/29/2019 showed a moderately large subacute stroke in the left parietal lobe not present 08/30/2020 and a punctate focus right anterior pons, not present 08/30/2020.   Scattered remote lacunar infarctions, seen previously.    Carotid U/S in February 2022 shows mild proximal ICA stenosis bilaterally.  Echo in 2020 was non-contribuatary.  She has no history of arrhythmia and denies palpitations.  Clinically, she is doing better with less confusion.  She still has some aphasia.  She still has difficulty with math.     Vascular risk factors:  She has hypertension and coronary artery disease but she took herself off all of her medications.     Imaging: MRI of the brain 09/28/2020 shows Moderately large subacute stroke in the left parietal lobe underlying the supramarginal and angular gyri.  This was not present on the 08/30/2020 MRI.  MRI of the brain from Aug 30, 2020.  It shows mild generalized cortical atrophy that is most pronounced in the medial temporal lobes.  Additionally, she has lacunar infarctions in the cerebellar hemispheres and bilateral thalamus.  There are moderate chronic microvascular ischemic changes in the hemispheres.  None of the ischemic findings were acute.    MRI of the brain 10/03/2015 showed a small acute left anteromedial thalamic lacunar infarction.  Compared to the 2007 MRI, there are additional chronic ischemic findings in the cerebellum, right  thalamus and hemispheres  MRI of the brain 11/21/2005 showed a small acute left thalamic infarct and a small acute right centrum semiovale infarction   [facility] Cognitive Assessment  09/24/2020  Visuospatial/ Executive (0/5) 0  Naming (0/3) 3  Attention: Read list of digits (0/2) 1  Attention: Read list of letters (0/1) 0  Attention: Serial 7 subtraction starting at 100 (0/3) 0  Language: Repeat phrase (0/2) 1  Language : Fluency (0/1) 1  Abstraction (0/2) 0  Delayed Recall (0/5) 0  Orientation (0/6) 3  Total 9  Adjusted Score (based on education) 10     REVIEW OF SYSTEMS: Constitutional: No fevers, chills, sweats, or change in appetite Eyes: No visual changes, double vision, eye pain Ear, nose and throat: No hearing loss, ear pain, nasal congestion, sore throat Cardiovascular: No chest pain, palpitations Respiratory: No shortness of breath at rest or with exertion.   No wheezes GastrointestinaI: No nausea, vomiting, diarrhea, abdominal pain, fecal incontinence Genitourinary: No dysuria, urinary retention or frequency.  No nocturia. Musculoskeletal: No neck pain, back pain Integumentary: No rash, pruritus, skin lesions Neurological: as above Psychiatric: No depression at this time.  No anxiety Endocrine: No palpitations, diaphoresis, change in appetite, change in weigh or increased thirst Hematologic/Lymphatic: No anemia, purpura, petechiae. Allergic/Immunologic: No itchy/runny eyes, nasal congestion, recent allergic reactions, rashes  ALLERGIES: Allergies  Allergen Reactions  . Penicillins Hives  . Tetracyclines & Related Nausea Only  . Elavil [Amitriptyline] Rash  . Erythromycin Rash  . Macrodantin [Nitrofurantoin Macrocrystal] Rash    HOME MEDICATIONS:  Current Outpatient Medications:  .  clopidogrel (PLAVIX)  75 MG tablet, Take 1 tablet (75 mg total) by mouth daily. After 5 days, stop aspirin, Disp: 90 tablet, Rfl: 3 .  metoprolol tartrate (LOPRESSOR) 25 MG  tablet, Take 25 mg by mouth 2 (two) times daily., Disp: , Rfl:  .  nitroGLYCERIN (NITROSTAT) 0.4 MG SL tablet, Place 1 tablet (0.4 mg total) under the tongue every 5 (five) minutes as needed for chest pain., Disp: 25 tablet, Rfl: 3 .  Red Yeast Rice Extract (RED YEAST RICE PO), Take 1 Dose by mouth daily., Disp: , Rfl:  .  Vitamin D, Ergocalciferol, (DRISDOL) 1.25 MG (50000 UNIT) CAPS capsule, Take 1 capsule (50,000 Units total) by mouth every 7 (seven) days. (Patient taking differently: Take 50,000 Units by mouth every 7 (seven) days. To take weekly x4 weeks starting 10/07/20. Then switch to 2000U/day thereafter), Disp: 13 capsule, Rfl: 1  PAST MEDICAL HISTORY: Past Medical History:  Diagnosis Date  . Anginal pain ([place]) since 2014  . Arthritis    just in the hands  . Cancer ([place]) 1988 or 89   basal cell carcinoma on face s/p Mohs procedure  . Complication of anesthesia    nausea  . History of wheezing    in spring and fall  . Hyperlipidemia   . Hypertension   . Non-obstructive CAD    a. 09/2013 Cath/NSTEMI: nonobs dzs.  . Non-STEMI (non-ST elevated myocardial infarction) ([place])    a. 09/2013 - peak trop 5.38;  b. 09/2013 Cath: nonobs dzs-->Med Rx.  . Pneumonia    "in my 20's"  . PONV (postoperative nausea and vomiting)   . Stroke [facility]) 2009   a. 09/2015 L Thalamic Lacunar infarct, presumed to be 2/2 small vessel dzs, residuals include numbness on right side of lips and right finger tips; b. 09/2015 Echo: nl LV fxn; c. 09/2015 Carotid U/S: mild bilat ICA stenosis.    PAST SURGICAL HISTORY: Past Surgical History:  Procedure Laterality Date  . ABDOMINAL HYSTERECTOMY  1974  . BREAST SURGERY Left 1976   lumpectomy  . EYE SURGERY     cataract surgery, both eyes  . FRACTURE SURGERY Right 1971   thumb and index finger  . LEFT HEART CATHETERIZATION WITH CORONARY ANGIOGRAM N/A 09/29/2013   Procedure: LEFT HEART CATHETERIZATION WITH CORONARY ANGIOGRAM;  Surgeon: Crystal M Martinique, MD;  Location[facility]  [facility] LAB;  Service: Cardiovascular;  Laterality: N/A;  . PARTIAL KNEE ARTHROPLASTY Right 12/31/2014   Procedure: RIGHT KNEE MEDIAL UNICOMPARTMENTAL ARTHROPLASTY;  Surgeon: Gaynelle Arabian, MD;  Location: [facility] ORS;  Service: Orthopedics;  Laterality: Right;    FAMILY HISTORY: Family History  Problem Relation Age of Onset  . Heart attack Father 76  . Cancer Mother   . Stroke Brother   . Stroke Paternal Grandmother   . Heart attack Maternal Grandfather     SOCIAL HISTORY:  Social History   Socioeconomic History  . Marital status: Widowed    Spouse name: Not on file  . Number of children: Not on file  . Years of education: Not on file  . Highest education level: Not on file  Occupational History  . Not on file  Tobacco Use  . Smoking status: Former Smoker    Packs/day: 0.25    Years: 5.00    Pack years: 1.25    Quit date: 08/17/1981    Years since quitting: 39.2  . Smokeless tobacco: Never Used  Substance and Sexual Activity  . Alcohol use: No  . Drug use: No  . Sexual  activity: Not on file  Other Topics Concern  . Not on file  Social History Narrative   ** Merged History Encounter **       Social Determinants of Health   Financial Resource Strain: Not on file  Food Insecurity: Not on file  Transportation Needs: Not on file  Physical Activity: Not on file  Stress: Not on file  Social Connections: Not on file  Intimate Partner Violence: Not on file     PHYSICAL EXAM  Vitals:   11/04/20 1308 11/04/20 1346  BP: (!) 201/90 (!) 162/80  Pulse: (!) 55   SpO2: 96%   Weight: 144 lb 8 oz (65.5 kg)   Height: 5\' 9"  (1.753 m)    REPEAT 185/100   Body mass index is 21.34 kg/m.   General: The patient is well-developed and well-nourished and in no acute distress  HEENT:  Head is [place]/AT.  Sclera are anicteric.    Neck: No carotid bruits are noted.  The neck is nontender.  Cardiovascular: The heart has a regular rate and rhythm with a normal S1 and S2. There were  no murmurs, gallops or rubs.    Skin: Extremities are without rash or  edema.  Musculoskeletal:  Back is nontender  Neurologic Exam  Mental status: The patient is alert and oriented x 1 at the time of the examination. Able to do 2+6=8 but not 5+7 or 12-7.  Less apraxia than last visit.  Named 5/6 items.   She was able to read and write.    Ok left/right now.   Can do 3 step command   Cranial nerves: Extraocular movements are full. Pupils are equal, round, and reactive to light and accomodation.  Visual fields are full.  Facial symmetry is present. There is good facial sensation to soft touch bilaterally.Facial strength is normal.  Trapezius and sternocleidomastoid strength is normal. No dysarthria is noted.  Hearing is ok.   Motor:  Muscle bulk is normal.   Tone is normal. Strength is  5 / 5 in all 4 extremities.   Sensory: Sensory testing is intact to vibration sensation in all 4 extremities.  She reported reduced touch sensation in the right hand relative to the left but symmetric sensation in the legs  Coordination: Cerebellar testing reveals good finger-nose-finger and heel-to-shin bilaterally.  Gait and station: Station is normal.   Gait is slightly wide..  She can turn 180 degrees in 4 steps.. Tandem gait is reduced. Romberg is negative.   Reflexes: Deep tendon reflexes are symmetric and normal bilaterally.   Plantar responses are flexor.    DIAGNOSTIC DATA (LABS, IMAGING, TESTING) - I reviewed patient records, labs, notes, testing and imaging myself where available.  Lab Results  Component Value Date   WBC 5.5 04/26/2019   HGB 14.3 04/26/2019   HCT 42.0 04/26/2019   MCV 89.3 04/26/2019   PLT 176 04/26/2019      Component Value Date/Time   NA 142 04/26/2019 0115   K 3.8 04/26/2019 0115   CL 104 04/26/2019 0115   CO2 26 04/26/2019 0016   GLUCOSE 115 (H) 04/26/2019 0115   BUN 16 04/26/2019 0115   CREATININE 1.20 (H) 04/26/2019 0115   CALCIUM 9.6 04/26/2019 0016   PROT  6.6 04/26/2019 0016   ALBUMIN 3.7 04/26/2019 0016   AST 24 04/26/2019 0016   ALT 8 04/26/2019 0016   ALKPHOS 46 04/26/2019 0016   BILITOT 0.6 04/26/2019 0016   GFRNONAA 43 (L) 04/26/2019 0016  GFRAA 50 (L) 04/26/2019 0016   Lab Results  Component Value Date   CHOL 279 (H) 04/26/2019   HDL 43 04/26/2019   LDLCALC 190 (H) 04/26/2019   TRIG 230 (H) 04/26/2019   CHOLHDL 6.5 04/26/2019   Lab Results  Component Value Date   HGBA1C 5.7 (H) 04/26/2019   No results found for: VITAMINB12 Lab Results  Component Value Date   TSH 2.197 04/27/2019       ASSESSMENT AND PLAN  Acute stroke due to ischemia (HCC) - Plan: CARDIAC EVENT MONITOR, CANCELED: CARDIAC EVENT MONITOR  Aphasia  Memory loss  Essential hypertension   1.  Continue Plavix.   Check event monitor to assess for atrial fibrillation.   2.  She will continue Speech therapy 3.   Continue vitamin D. 4.   Stay active  5.  F/u in 1 year, sooner if new or worsneing symtoms.     Richard A. Felecia Shelling, MD, [facility] 6/43/8377, 9:39 PM Certified in Neurology, Clinical Neurophysiology, Sleep Medicine and Neuroimaging  [facility] [address], [place], [place] [address] [phone]

## 2020-11-04 NOTE — Therapy (Signed)
[facility] [address] [place], [place], [address] Phone: [phone]   Fax:  [phone]  Speech Language Pathology Treatment  Patient Details  Name: Crystal Day MRN: [id number] Date of Birth: November 11, 1934 Referring Provider (SLP): Ref: Benjiman Core PA (Neuro: Arlice Colt MD)   Encounter Date: 11/04/2020   End of Session - 11/04/20 1215    Visit Number 8    Number of Visits 17    Date for SLP Re-Evaluation 01/08/21    SLP Start Time 8563    SLP Stop Time  1230    SLP Time Calculation (min) 45 min    Activity Tolerance Patient tolerated treatment well           Past Medical History:  Diagnosis Date  . Anginal pain ([place]) since 2014  . Arthritis    just in the hands  . Cancer ([place]) 1988 or 89   basal cell carcinoma on face s/p Mohs procedure  . Complication of anesthesia    nausea  . History of wheezing    in spring and fall  . Hyperlipidemia   . Hypertension   . Non-obstructive CAD    a. 09/2013 Cath/NSTEMI: nonobs dzs.  . Non-STEMI (non-ST elevated myocardial infarction) ([place])    a. 09/2013 - peak trop 5.38;  b. 09/2013 Cath: nonobs dzs-->Med Rx.  . Pneumonia    "in my 20's"  . PONV (postoperative nausea and vomiting)   . Stroke [facility]) 2009   a. 09/2015 L Thalamic Lacunar infarct, presumed to be 2/2 small vessel dzs, residuals include numbness on right side of lips and right finger tips; b. 09/2015 Echo: nl LV fxn; c. 09/2015 Carotid U/S: mild bilat ICA stenosis.    Past Surgical History:  Procedure Laterality Date  . ABDOMINAL HYSTERECTOMY  1974  . BREAST SURGERY Left 1976   lumpectomy  . EYE SURGERY     cataract surgery, both eyes  . FRACTURE SURGERY Right 1971   thumb and index finger  . LEFT HEART CATHETERIZATION WITH CORONARY ANGIOGRAM N/A 09/29/2013   Procedure: LEFT HEART CATHETERIZATION WITH CORONARY ANGIOGRAM;  Surgeon: Peter M Martinique, MD;  Location: [facility] CATH LAB;  Service: Cardiovascular;   Laterality: N/A;  . PARTIAL KNEE ARTHROPLASTY Right 12/31/2014   Procedure: RIGHT KNEE MEDIAL UNICOMPARTMENTAL ARTHROPLASTY;  Surgeon: Gaynelle Arabian, MD;  Location: [facility] ORS;  Service: Orthopedics;  Laterality: Right;    There were no vitals filed for this visit.   Subjective Assessment - 11/04/20 1145    Subjective "I've been wanting to do" re: yardwork    Patient is accompained by: Family member   [name]   Currently in Pain? No/denies                 ADULT SLP TREATMENT - 11/04/20 1146      General Information   Behavior/Cognition Alert;Cooperative;Pleasant mood      Treatment Provided   Treatment provided Cognitive-Linquistic      Cognitive-Linquistic Treatment   Treatment focused on Aphasia;Cognition;Patient/family/caregiver education    Skilled Treatment SLP reviewed HEP, in which pt's daughter reports she verbally completed sentence complete HWK without cues and only rare repetitions. SLP targeted word finding for picture descriptions, with pt able to verbally sequence 3 pictures with occasional min A for semantic cues and rare phonemic cues. Increased frequency of self-corrections noted this session. Mod A intermittently required to elaborate on specific details of each step. SLP discussed opportunities to practice speech at home, in which pt  reported she talks on the phone but she doesn't say much d/t aphasia. SLP targeted scripted responses for conversation as HEP.      Assessment / Recommendations / Plan   Plan Continue with current plan of care      Progression Toward Goals   Progression toward goals Progressing toward goals            SLP Education - 11/04/20 1234    Education Details opportunities to increase verbal output, scripting for telephone conversation    Person(s) Educated Patient;Caregiver(s)    Methods Explanation;Demonstration;Handout    Comprehension Verbalized understanding;Returned demonstration;Need further instruction            SLP  Short Term Goals - 11/04/20 1144      SLP SHORT TERM GOAL #1   Title Pt will demonstrate use of word finding compensations in simple 5 minute conversation with min A over 2 sessions    Baseline 11-04-20    Time 1    Period Weeks    Status On-going      SLP SHORT TERM GOAL #2   Title Pt's caregiver will appropriately cue patient when word finding episodes occur with rare min A over 2 sessions    Baseline 10-28-20, 10-30-20    Status Achieved      SLP SHORT TERM GOAL #3   Title Pt will generate functional sentences to improve communication of basic wants/needs with min A over 2 sessions    Time 1    Period Weeks    Status On-going      SLP SHORT TERM GOAL #4   Title Pt will complete formal cognitive linguistic assessment in first 2 sessions    Status Achieved      SLP SHORT TERM GOAL #5   Title Pt will carryover external aids to recall to-do list, appointments and pertinent information with occasional min A over 3 sessions    Time 1    Period Weeks    Status Deferred            SLP Long Term Goals - 11/04/20 1144      SLP LONG TERM GOAL #1   Title Pt will use word finding compensations in 10 to 15 minute mod complex conversation with rare min A over 2 sessions    Time 5    Period Weeks    Status On-going      SLP LONG TERM GOAL #2   Title Pt will generate functional sentences to improve communication of complex wants/needs with min A over 2 sessions    Time 5    Period Weeks    Status On-going      SLP LONG TERM GOAL #3   Title Pt will carryover external aids to recall to-do list, appointments and pertinent information with rare min A over 3 sessions    Time 5    Period Weeks    Status On-going            Plan - 11/04/20 [place] presents with mild to moderate expressive aphasia and cognitive linguistic deficits. SLP targeted word finding for sentence and short conversational levels, with less frequent reliance on her daughter  demonstrated. Pt able to compensate for word finding with rare phonemic cues, occasional semantic cues, and occasional carrier phrases. For picture description task, pt benefited from additional processing time in which pt able to usually self-correct errors. Pt would benefit from skilled ST services to address aphasia, possible  oral apraxia, and decline in cognition to maximize communication effectiveness and functional independence as pt desires return to PLOF.    Speech Therapy Frequency 2x / week    Duration 8 weeks   or 17 visits   Treatment/Interventions Compensatory strategies;Patient/family education;Functional tasks;Cueing hierarchy;Multimodal communcation approach;Cognitive reorganization;Compensatory techniques;Internal/external aids;SLP instruction and feedback    Potential to Achieve Goals Good    Potential Considerations Co-morbidities    SLP Home Exercise Plan provided    Consulted and Agree with Plan of Care Patient;Family member/caregiver           Patient will benefit from skilled therapeutic intervention in order to improve the following deficits and impairments:   Aphasia  Cognitive communication deficit    Problem List Patient Active Problem List   Diagnosis Date Noted  . Vitamin D deficiency 09/30/2020  . CVA (cerebral vascular accident) ([place]) 04/26/2019  . Non-STEMI (non-ST elevated myocardial infarction) ([place])   . Non-obstructive CAD   . Hypertension   . TIA (transient ischemic attack)   . Lacunar infarct, acute ([place]) 10/03/2015  . CKD (chronic kidney disease) stage 3, GFR 30-59 ml/min (HCC) 10/03/2015  . Acute stroke due to ischemia ([place])   . HLD (hyperlipidemia)   . OA (osteoarthritis) of knee 12/31/2014  . Syncope 09/28/2014  . Hyperlipidemia 04/17/2014  . Atherosclerosis of native coronary artery of native heart without angina pectoris 10/05/2013  . NSTEMI (non-ST elevated myocardial infarction) ([place]) 09/29/2013  . Chest pain 09/28/2013  . Essential  hypertension 09/28/2013  . Acute renal failure ([place]) 09/28/2013    Alinda Deem, MA CCC-SLP 11/04/2020, 1:48 PM  [facility] [address] [place], [place], [address] Phone: [phone]   Fax:  [phone]   Name: Crystal Day MRN: [id number] Date of Birth: 20-Jun-1935

## 2020-11-04 NOTE — Patient Instructions (Signed)
Think of a few responses you could answer to these questions:   1) How are you?      2) What have you eaten today?      3) How is your family?       4) How is your yard doing?       5) Do you have any upcoming plans?

## 2020-11-05 ENCOUNTER — Encounter: Payer: Self-pay | Admitting: *Deleted

## 2020-11-05 ENCOUNTER — Telehealth: Payer: Self-pay | Admitting: *Deleted

## 2020-11-05 NOTE — Telephone Encounter (Signed)
Patient enrolled for [employer] to ship a 30 day cardiac event monitor to her home.   Instructions reviewed briefly with daughter, Novella Rob, as they will be included in the monitor kit.   Letter with instructions also mailed to patients home.

## 2020-11-06 ENCOUNTER — Ambulatory Visit: Payer: PPO

## 2020-11-06 ENCOUNTER — Other Ambulatory Visit: Payer: Self-pay

## 2020-11-06 DIAGNOSIS — R4701 Aphasia: Secondary | ICD-10-CM | POA: Diagnosis not present

## 2020-11-06 DIAGNOSIS — R41841 Cognitive communication deficit: Secondary | ICD-10-CM

## 2020-11-06 NOTE — Therapy (Signed)
[facility] [address] [place], [place], [address] Phone: [phone]   Fax:  [phone]  Speech Language Pathology Treatment  Patient Details  Name: Crystal Day MRN: [id number] Date of Birth: 1934-12-27 Referring Provider (SLP): Ref: Benjiman Core PA (Neuro: Arlice Colt MD)   Encounter Date: 11/06/2020   End of Session - 11/06/20 1648    Visit Number 9    Number of Visits 17    Date for SLP Re-Evaluation 01/08/21    SLP Start Time 1700    SLP Stop Time  1324    SLP Time Calculation (min) 45 min    Activity Tolerance Patient tolerated treatment well           Past Medical History:  Diagnosis Date  . Anginal pain ([facility]) since 2014  . Arthritis    just in the hands  . Cancer ([facility]) 1988 or 89   basal cell carcinoma on face s/p Mohs procedure  . Complication of anesthesia    nausea  . History of wheezing    in spring and fall  . Hyperlipidemia   . Hypertension   . Non-obstructive CAD    a. 09/2013 Cath/NSTEMI: nonobs dzs.  . Non-STEMI (non-ST elevated myocardial infarction) ([place] [facility])    a. 09/2013 - peak trop 5.38;  b. 09/2013 Cath: nonobs dzs-->Med Rx.  . Pneumonia    "in my 20's"  . PONV (postoperative nausea and vomiting)   . Stroke [facility]) 2009   a. 09/2015 L Thalamic Lacunar infarct, presumed to be 2/2 small vessel dzs, residuals include numbness on right side of lips and right finger tips; b. 09/2015 Echo: nl LV fxn; c. 09/2015 Carotid U/S: mild bilat ICA stenosis.    Past Surgical History:  Procedure Laterality Date  . ABDOMINAL HYSTERECTOMY  1974  . BREAST SURGERY Left 1976   lumpectomy  . EYE SURGERY     cataract surgery, both eyes  . FRACTURE SURGERY Right 1971   thumb and index finger  . LEFT HEART CATHETERIZATION WITH CORONARY ANGIOGRAM N/A 09/29/2013   Procedure: LEFT HEART CATHETERIZATION WITH CORONARY ANGIOGRAM;  Surgeon: Peter M Martinique, MD;  Location: [facility] CATH LAB;  Service: Cardiovascular;   Laterality: N/A;  . PARTIAL KNEE ARTHROPLASTY Right 12/31/2014   Procedure: RIGHT KNEE MEDIAL UNICOMPARTMENTAL ARTHROPLASTY;  Surgeon: Gaynelle Arabian, MD;  Location: [facility] ORS;  Service: Orthopedics;  Laterality: Right;    There were no vitals filed for this visit.   Subjective Assessment - 11/06/20 1659    Subjective "i've mowed the yard"    Patient is accompained by: Family member    Currently in Pain? No/denies                 ADULT SLP TREATMENT - 11/06/20 1649      General Information   Behavior/Cognition Alert;Cooperative;Pleasant mood      Treatment Provided   Treatment provided Cognitive-Linquistic      Cognitive-Linquistic Treatment   Treatment focused on Aphasia;Cognition;Patient/family/caregiver education    Skilled Treatment Increased word finding exhibited for later session today. SLP targeted functional reading for HEP homework, in which min A required to accurately state sentence given intermittent substitutions and omissions noted. SLP targeted naming of functional phrases and sentences in response to common open-ended questions, with min to mod A required to generate and elicit full sentences versus 1-2 words. Mod A required for auditory comprehension of specific questions, as pt noted with tangential and irrelevant responses. Repeition was effective in improving  comprehension of information. SLP targeted implementation and carryover of previously created visual aid to facilitate naming, with mod A to min A required to utilize. Continue per POC.      Assessment / Recommendations / Plan   Plan Continue with current plan of care      Progression Toward Goals   Progression toward goals Progressing toward goals            SLP Education - 11/06/20 1837    Education Details using naming written aid more frequently, decreased accuracy throughout day, practice reading and sentences    Person(s) Educated Patient;Caregiver(s)    Methods  Explanation;Demonstration;Handout    Comprehension Verbalized understanding;Returned demonstration;Need further instruction            SLP Short Term Goals - 11/06/20 1648      SLP SHORT TERM GOAL #1   Title Pt will demonstrate use of word finding compensations in simple 5 minute conversation with min A over 2 sessions    Baseline 11-04-20    Time 1    Period Weeks    Status On-going      SLP SHORT TERM GOAL #2   Title Pt's caregiver will appropriately cue patient when word finding episodes occur with rare min A over 2 sessions    Baseline 10-28-20, 10-30-20    Status Achieved      SLP SHORT TERM GOAL #3   Title Pt will generate functional sentences to improve communication of basic wants/needs with min A over 2 sessions    Baseline 11-06-20    Time 1    Period Weeks    Status On-going      SLP SHORT TERM GOAL #4   Title Pt will complete formal cognitive linguistic assessment in first 2 sessions    Status Achieved      SLP SHORT TERM GOAL #5   Title Pt will carryover external aids to recall to-do list, appointments and pertinent information with occasional min A over 3 sessions    Time 1    Period Weeks    Status Deferred            SLP Long Term Goals - 11/06/20 1648      SLP LONG TERM GOAL #1   Title Pt will use word finding compensations in 10 to 15 minute mod complex conversation with rare min A over 2 sessions    Time 5    Period Weeks    Status On-going      SLP LONG TERM GOAL #2   Title Pt will generate functional sentences to improve communication of complex wants/needs with min A over 2 sessions    Time 5    Period Weeks    Status On-going      SLP LONG TERM GOAL #3   Title Pt will carryover external aids to recall to-do list, appointments and pertinent information with rare min A over 3 sessions    Time 5    Period Weeks    Status On-going            Plan - 11/06/20 1838    Clinical Impression Statement Crystal Day presents with mild to moderate  expressive aphasia and cognitive linguistic deficits. SLP targeted word finding for functional conversational phrases, in which pt provided 1-2 word answers independently. Mod prompting required to elicit full sentence. Increased cues required to improve comprehension of SLP questions this session, with repetition and visual cues warranted to improve attention to specific infromation. SLP targeted  use of previously created naming visual aid to facilitate functional naming, in which pt able to utilize with mod A fading to min A when prompted with scenario. 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    Speech Therapy Frequency 2x / week    Duration 8 weeks   or 17 total visits   Treatment/Interventions Compensatory strategies;Patient/family education;Functional tasks;Cueing hierarchy;Multimodal communcation approach;Cognitive reorganization;Compensatory techniques;Internal/external aids;SLP instruction and feedback    Potential to Achieve Goals Good    Potential Considerations Co-morbidities    SLP Home Exercise Plan provided    Consulted and Agree with Plan of Care Patient;Family member/caregiver           Patient will benefit from skilled therapeutic intervention in order to improve the following deficits and impairments:   Aphasia  Cognitive communication deficit    Problem List Patient Active Problem List   Diagnosis Date Noted  . Aphasia 11/04/2020  . Memory loss 11/04/2020  . Vitamin D deficiency 09/30/2020  . CVA (cerebral vascular accident) ([facility]) 04/26/2019  . Non-STEMI (non-ST elevated myocardial infarction) ([facility])   . Non-obstructive CAD   . Hypertension   . TIA (transient ischemic attack)   . Lacunar infarct, acute ([place]) 10/03/2015  . CKD (chronic kidney disease) stage 3, GFR 30-59 ml/min ([facility]) 10/03/2015  . Acute  stroke due to ischemia ([place])   . HLD (hyperlipidemia)   . OA (osteoarthritis) of knee 12/31/2014  . Syncope 09/28/2014  . Hyperlipidemia 04/17/2014  . Atherosclerosis of native coronary artery of native heart without angina pectoris 10/05/2013  . NSTEMI (non-ST elevated myocardial infarction) ([facility]) 09/29/2013  . Chest pain 09/28/2013  . Essential hypertension 09/28/2013  . Acute renal failure ([facility]) 09/28/2013    Alinda Deem, MA CCC-SLP 11/06/2020, 6:46 PM  [facility] [address] [place], [place], [address] Phone: [phone]   Fax:  [phone]   Name: Crystal Day MRN: [id number] Date of Birth: 1935/08/10

## 2020-11-11 ENCOUNTER — Other Ambulatory Visit: Payer: Self-pay

## 2020-11-11 ENCOUNTER — Ambulatory Visit: Payer: PPO

## 2020-11-11 DIAGNOSIS — R4701 Aphasia: Secondary | ICD-10-CM

## 2020-11-11 DIAGNOSIS — R41841 Cognitive communication deficit: Secondary | ICD-10-CM

## 2020-11-11 NOTE — Therapy (Signed)
[facility] [address] [place], [place], [address] Phone: [phone]   Fax:  [phone]  Speech Language Pathology Treatment-Progress note  Patient Details  Name: Crystal Day MRN: [id number] Date of Birth: [birth year] Referring Provider (SLP): Ref: Benjiman Core PA (Neuro: Arlice Colt MD)   Encounter Date: 11/11/2020   End of Session - 11/11/20 1656    Visit Number 10    Number of Visits 17    Date for SLP Re-Evaluation 01/08/21    SLP Start Time 1700    SLP Stop Time  3614    SLP Time Calculation (min) 45 min    Activity Tolerance Patient tolerated treatment well           Past Medical History:  Diagnosis Date  . Anginal pain ([facility]) since 2014  . Arthritis    just in the hands  . Cancer ([facility]) 1988 or 89   basal cell carcinoma on face s/p Mohs procedure  . Complication of anesthesia    nausea  . History of wheezing    in spring and fall  . Hyperlipidemia   . Hypertension   . Non-obstructive CAD    a. 09/2013 Cath/NSTEMI: nonobs dzs.  . Non-STEMI (non-ST elevated myocardial infarction) ([facility])    a. 09/2013 - peak trop 5.38;  b. 09/2013 Cath: nonobs dzs-->Med Rx.  . Pneumonia    "in my 20's"  . PONV (postoperative nausea and vomiting)   . Stroke [facility]) 2009   a. 09/2015 L Thalamic Lacunar infarct, presumed to be 2/2 small vessel dzs, residuals include numbness on right side of lips and right finger tips; b. 09/2015 Echo: nl LV fxn; c. 09/2015 Carotid U/S: mild bilat ICA stenosis.    Past Surgical History:  Procedure Laterality Date  . ABDOMINAL HYSTERECTOMY  1974  . BREAST SURGERY Left 1976   lumpectomy  . EYE SURGERY     cataract surgery, both eyes  . FRACTURE SURGERY Right 1971   thumb and index finger  . LEFT HEART CATHETERIZATION WITH CORONARY ANGIOGRAM N/A 09/29/2013   Procedure: LEFT HEART CATHETERIZATION WITH CORONARY ANGIOGRAM;  Surgeon: Peter M Martinique, MD;  Location[facility] CATH LAB;  Service:  Cardiovascular;  Laterality: N/A;  . PARTIAL KNEE ARTHROPLASTY Right 12/31/2014   Procedure: RIGHT KNEE MEDIAL UNICOMPARTMENTAL ARTHROPLASTY;  Surgeon: Gaynelle Arabian, MD;  Location: [facility] ORS;  Service: Orthopedics;  Laterality: Right;    There were no vitals filed for this visit.   Subjective Assessment - 11/11/20 1702    Subjective "we went to stores" re: weekend plans (not factual per daughter)    Patient is accompained by: Family member   Crystal Day   Currently in Pain? No/denies                 ADULT SLP TREATMENT - 11/11/20 1747      General Information   Behavior/Cognition Alert;Cooperative;Pleasant mood      Treatment Provided   Treatment provided Cognitive-Linquistic      Cognitive-Linquistic Treatment   Treatment focused on Aphasia;Cognition;Patient/family/caregiver education    Skilled Treatment SLP introduced VNeST this session to word finding and to elicit sentence responses. Pt able to name who/what with min A and additional processing time. Pt able to elicit prompted setences for where/when/why with min to mod A. SLP recommended to daughter to complete VNeST with handout provided on how to complete. Pt continues to exhibit increased word finding in simple conversation related to daily activities. Pt continues to rely on  daughter for information, which could be correlated to both cognition and communication      Assessment / Recommendations / Plan   Plan Continue with current plan of care      Progression Toward Goals   Progression toward goals Progressing toward goals            SLP Education - 11/11/20 1744    Education Details VNeST    Person(s) Educated Patient;Parent(s)    Methods Explanation;Demonstration;Handout    Comprehension Verbalized understanding;Returned demonstration;Need further instruction            SLP Short Term Goals - 11/11/20 1656      SLP SHORT TERM GOAL #1   Title Pt will demonstrate use of word finding compensations in simple 5  minute conversation with min A over 2 sessions    Baseline 11-04-20    Status Partially Met      SLP SHORT TERM GOAL #2   Title Pt's caregiver will appropriately cue patient when word finding episodes occur with rare min A over 2 sessions    Baseline 10-28-20, 10-30-20    Status Achieved      SLP SHORT TERM GOAL #3   Title Pt will generate functional sentences to improve communication of basic wants/needs with min A over 2 sessions    Baseline 11-06-20    Status Partially Met      SLP SHORT TERM GOAL #4   Title Pt will complete formal cognitive linguistic assessment in first 2 sessions    Status Achieved      SLP SHORT TERM GOAL #5   Title Pt will carryover external aids to recall to-do list, appointments and pertinent information with occasional min A over 3 sessions    Status Deferred            SLP Long Term Goals - 11/11/20 1657      SLP LONG TERM GOAL #1   Title Pt will use word finding compensations in 10 to 15 minute mod complex conversation with rare min A over 2 sessions    Time 4    Period Weeks    Status On-going      SLP LONG TERM GOAL #2   Title Pt will generate functional sentences to improve communication of complex wants/needs with min A over 2 sessions    Time 4    Period Weeks    Status On-going      SLP LONG TERM GOAL #3   Title Pt will carryover external aids to recall to-do list, appointments and pertinent information with rare min A over 3 sessions    Time 4    Period Weeks    Status On-going            Plan - 11/11/20 [place] presents with mild to moderate expressive aphasia and cognitive linguistic deficits. SLP targeted word finding via VNeST. Pt able to ID who/what with min A. Min to rare mod prompting required to elicit full sentence for when/where/why. SLP instructed patient and daughter to complete VNeST as part of HEP. Pt's daughter reports pt had signifcant difficulty naming given first letter category  prompt as HEP last session. 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    Speech Therapy Frequency 2x / week    Duration 8 weeks   or 17 total  visits   Treatment/Interventions Compensatory strategies;Patient/family education;Functional tasks;Cueing hierarchy;Multimodal communcation approach;Cognitive reorganization;Compensatory techniques;Internal/external aids;SLP instruction and feedback    Potential to Achieve Goals Good    Potential Considerations Co-morbidities    SLP Home Exercise Plan provided    Consulted and Agree with Plan of Care Patient;Family member/caregiver          Speech Therapy Progress Note  Dates of Reporting Period: 10-10-2020 to present  Objective Reports of Subjective Statement: Pt has been seen for 10 ST visits to address aphasia and cognitive linguistic skills in order to improve communication effectiveness and return to PLOF  Objective Measurements: Pt presents with slow improvements in word finding impacting overall communication effectiveness. Pt has benefited from various levels of cues, which SLP educated and trained caregivers to provide appropriate cues. Pt continues to require various levels of cues for word finding due to perseverations and empty speech lacking specific information. Aphasia has been main focus on skilled ST intervention thus far, with cognitive linguistic skills to be addressed as word finding improves.   Goal Update: see goals above  Plan: see plan above  Reason Skilled Services are Required: Pt would benefit from continuation of POC as pt has not yet met rehab potential    Patient will benefit from skilled therapeutic intervention in order to improve the following deficits and impairments:   Aphasia  Cognitive communication deficit    Problem List Patient Active  Problem List   Diagnosis Date Noted  . Aphasia 11/04/2020  . Memory loss 11/04/2020  . Vitamin D deficiency 09/30/2020  . CVA (cerebral vascular accident) ([facility]) 04/26/2019  . Non-STEMI (non-ST elevated myocardial infarction) ([facility])   . Non-obstructive CAD   . Hypertension   . TIA (transient ischemic attack)   . Lacunar infarct, acute ([facility]) 10/03/2015  . CKD (chronic kidney disease) stage 3, GFR 30-59 ml/min ([facility]) 10/03/2015  . Acute stroke due to ischemia ([place])   . HLD (hyperlipidemia)   . OA (osteoarthritis) of knee 12/31/2014  . Syncope 09/28/2014  . Hyperlipidemia 04/17/2014  . Atherosclerosis of native coronary artery of native heart without angina pectoris 10/05/2013  . NSTEMI (non-ST elevated myocardial infarction) ([place] [facility]) 09/29/2013  . Chest pain 09/28/2013  . Essential hypertension 09/28/2013  . Acute renal failure ([facility]) 09/28/2013    Alinda Deem, MA CCC-SLP 11/11/2020, 5:50 PM  [facility] [address] [place], [place], [address] Phone: [phone]   Fax:  [phone]   Name: Crystal Day MRN: [id number] Date of Birth: 08-Apr-1935

## 2020-11-13 ENCOUNTER — Other Ambulatory Visit: Payer: Self-pay

## 2020-11-13 ENCOUNTER — Ambulatory Visit: Payer: PPO

## 2020-11-13 DIAGNOSIS — R4701 Aphasia: Secondary | ICD-10-CM

## 2020-11-13 DIAGNOSIS — R41841 Cognitive communication deficit: Secondary | ICD-10-CM

## 2020-11-13 NOTE — Therapy (Signed)
[facility] [address] [place], [place], [address] Phone: [phone]   Fax:  [phone]  Speech Language Pathology Treatment  Patient Details  Name: Crystal Day MRN: [id number] Date of Birth: 06/25/1935 Referring Provider (SLP): Ref: Benjiman Core PA (Neuro: Arlice Colt MD)   Encounter Date: 11/13/2020   End of Session - 11/13/20 1850    Visit Number 11    Number of Visits 17    Date for SLP Re-Evaluation 01/08/21    SLP Start Time 8185    SLP Stop Time  6314    SLP Time Calculation (min) 45 min    Activity Tolerance Patient tolerated treatment well           Past Medical History:  Diagnosis Date  . Anginal pain ([place]) since 2014  . Arthritis    just in the hands  . Cancer ([place]) 1988 or 89   basal cell carcinoma on face s/p Mohs procedure  . Complication of anesthesia    nausea  . History of wheezing    in spring and fall  . Hyperlipidemia   . Hypertension   . Non-obstructive CAD    a. 09/2013 Cath/NSTEMI: nonobs dzs.  . Non-STEMI (non-ST elevated myocardial infarction) ([place])    a. 09/2013 - peak trop 5.38;  b. 09/2013 Cath: nonobs dzs-->Med Rx.  . Pneumonia    "in my 20's"  . PONV (postoperative nausea and vomiting)   . Stroke [facility]) 2009   a. 09/2015 L Thalamic Lacunar infarct, presumed to be 2/2 small vessel dzs, residuals include numbness on right side of lips and right finger tips; b. 09/2015 Echo: nl LV fxn; c. 09/2015 Carotid U/S: mild bilat ICA stenosis.    Past Surgical History:  Procedure Laterality Date  . ABDOMINAL HYSTERECTOMY  1974  . BREAST SURGERY Left 1976   lumpectomy  . EYE SURGERY     cataract surgery, both eyes  . FRACTURE SURGERY Right 1971   thumb and index finger  . LEFT HEART CATHETERIZATION WITH CORONARY ANGIOGRAM N/A 09/29/2013   Procedure: LEFT HEART CATHETERIZATION WITH CORONARY ANGIOGRAM;  Surgeon: Peter M Martinique, MD;  Location: [facility] CATH LAB;  Service: Cardiovascular;   Laterality: N/A;  . PARTIAL KNEE ARTHROPLASTY Right 12/31/2014   Procedure: RIGHT KNEE MEDIAL UNICOMPARTMENTAL ARTHROPLASTY;  Surgeon: Gaynelle Arabian, MD;  Location: [facility] ORS;  Service: Orthopedics;  Laterality: Right;    There were no vitals filed for this visit.   Subjective Assessment - 11/13/20 1749    Subjective "same old tale"    Patient is accompained by: Family member    Currently in Pain? No/denies                 ADULT SLP TREATMENT - 11/13/20 1749      General Information   Behavior/Cognition Alert;Cooperative;Pleasant mood      Treatment Provided   Treatment provided Cognitive-Linquistic      Cognitive-Linquistic Treatment   Treatment focused on Aphasia;Cognition;Patient/family/caregiver education    Skilled Treatment Pt's daughter provided example of word finding, in which pt requested "dumplings" for dinner with location of restaurant specifically provided and instructed. Location was accurate; however, pt named wrong items (i.e., tacos). SLP engaged pt in picture naming task, in which pt named 45 pictures with 86% accuracy, which improved with semantic cues, phonemic cues, and carrier phrases. SLP to create picture-based AAC aid to trial given persistent frustration related to verbal ID of food items.      Assessment /  Recommendations / Plan   Plan Continue with current plan of care      Progression Toward Goals   Progression toward goals Progressing toward goals            SLP Education - 11/13/20 1850    Education Details picture naming, initation of HEP I    Person(s) Educated Patient;Child(ren)    Methods Explanation;Demonstration;Handout    Comprehension Verbalized understanding;Returned demonstration;Need further instruction            SLP Short Term Goals - 11/11/20 1656      SLP SHORT TERM GOAL #1   Title Pt will demonstrate use of word finding compensations in simple 5 minute conversation with min A over 2 sessions    Baseline 11-04-20     Status Partially Met      SLP SHORT TERM GOAL #2   Title Pt's caregiver will appropriately cue patient when word finding episodes occur with rare min A over 2 sessions    Baseline 10-28-20, 10-30-20    Status Achieved      SLP SHORT TERM GOAL #3   Title Pt will generate functional sentences to improve communication of basic wants/needs with min A over 2 sessions    Baseline 11-06-20    Status Partially Met      SLP SHORT TERM GOAL #4   Title Pt will complete formal cognitive linguistic assessment in first 2 sessions    Status Achieved      SLP SHORT TERM GOAL #5   Title Pt will carryover external aids to recall to-do list, appointments and pertinent information with occasional min A over 3 sessions    Status Deferred            SLP Long Term Goals - 11/13/20 1852      SLP LONG TERM GOAL #1   Title Pt will use word finding compensations in 10 to 15 minute mod complex conversation with rare min A over 2 sessions    Time 4    Period Weeks    Status On-going      SLP LONG TERM GOAL #2   Title Pt will generate functional sentences to improve communication of complex wants/needs with min A over 2 sessions    Time 4    Period Weeks    Status On-going      SLP LONG TERM GOAL #3   Title Pt will carryover external aids to recall to-do list, appointments and pertinent information with rare min A over 3 sessions    Time 4    Period Weeks    Status On-going            Plan - 11/13/20 [place] presents with mild to moderate expressive aphasia and cognitive linguistic deficits. SLP targeted picture naming, in which pt exhibited improved accuracy given visual cues. Pt able to occasionally use gestures and descriptions. SLP to create visual low-tech AAC aid for food items given persistent frustration related to naming desired food items. 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    Speech Therapy Frequency 2x / week    Duration 8 weeks   or 17 visits   Treatment/Interventions Compensatory strategies;Patient/family education;Functional tasks;Cueing hierarchy;Multimodal communcation approach;Cognitive reorganization;Compensatory techniques;Internal/external aids;SLP instruction and feedback    Potential to Achieve Goals Good    Potential Considerations  Co-morbidities    SLP Home Exercise Plan provided    Consulted and Agree with Plan of Care Patient;Family member/caregiver           Patient will benefit from skilled therapeutic intervention in order to improve the following deficits and impairments:   Aphasia  Cognitive communication deficit    Problem List Patient Active Problem List   Diagnosis Date Noted  . Aphasia 11/04/2020  . Memory loss 11/04/2020  . Vitamin D deficiency 09/30/2020  . CVA (cerebral vascular accident) ([place]) 04/26/2019  . Non-STEMI (non-ST elevated myocardial infarction) ([place])   . Non-obstructive CAD   . Hypertension   . TIA (transient ischemic attack)   . Lacunar infarct, acute ([place]) 10/03/2015  . CKD (chronic kidney disease) stage 3, GFR 30-59 ml/min ([facility]) 10/03/2015  . Acute stroke due to ischemia ([place])   . HLD (hyperlipidemia)   . OA (osteoarthritis) of knee 12/31/2014  . Syncope 09/28/2014  . Hyperlipidemia 04/17/2014  . Atherosclerosis of native coronary artery of native heart without angina pectoris 10/05/2013  . NSTEMI (non-ST elevated myocardial infarction) ([place]) 09/29/2013  . Chest pain 09/28/2013  . Essential hypertension 09/28/2013  . Acute renal failure ([place]) 09/28/2013    Alinda Deem, MA CCC-SLP 11/13/2020, 6:52 PM  [facility] [address] [place], [place], [address] Phone: [phone]   Fax:  [phone]   Name: Crystal Day MRN:  [id number] Date of Birth: 1934/12/27

## 2020-11-18 ENCOUNTER — Other Ambulatory Visit: Payer: Self-pay

## 2020-11-18 ENCOUNTER — Ambulatory Visit: Payer: PPO | Attending: Physician Assistant

## 2020-11-18 DIAGNOSIS — R4701 Aphasia: Secondary | ICD-10-CM | POA: Insufficient documentation

## 2020-11-18 DIAGNOSIS — R1312 Dysphagia, oropharyngeal phase: Secondary | ICD-10-CM | POA: Insufficient documentation

## 2020-11-18 DIAGNOSIS — R41841 Cognitive communication deficit: Secondary | ICD-10-CM | POA: Diagnosis not present

## 2020-11-18 NOTE — Therapy (Signed)
[facility] [address] [place], [place], [address] Phone: [phone]   Fax:  [phone]  Speech Language Pathology Treatment  Patient Details  Name: Crystal Day MRN: [id number] Date of Birth: 1935/08/06 Referring Provider (SLP): Ref: Benjiman Core PA (Neuro: Arlice Colt MD)   Encounter Date: 11/18/2020   End of Session - 11/18/20 1657    Visit Number 12    Number of Visits 17    Date for SLP Re-Evaluation 01/08/21    SLP Start Time 32    SLP Stop Time  3007    SLP Time Calculation (min) 45 min    Activity Tolerance Patient tolerated treatment well           Past Medical History:  Diagnosis Date  . Anginal pain ([place]) since 2014  . Arthritis    just in the hands  . Cancer ([place]) 1988 or 89   basal cell carcinoma on face s/p Mohs procedure  . Complication of anesthesia    nausea  . History of wheezing    in spring and fall  . Hyperlipidemia   . Hypertension   . Non-obstructive CAD    a. 09/2013 Cath/NSTEMI: nonobs dzs.  . Non-STEMI (non-ST elevated myocardial infarction) ([place])    a. 09/2013 - peak trop 5.38;  b. 09/2013 Cath: nonobs dzs-->Med Rx.  . Pneumonia    "in my 20's"  . PONV (postoperative nausea and vomiting)   . Stroke [facility]) 2009   a. 09/2015 L Thalamic Lacunar infarct, presumed to be 2/2 small vessel dzs, residuals include numbness on right side of lips and right finger tips; b. 09/2015 Echo: nl LV fxn; c. 09/2015 Carotid U/S: mild bilat ICA stenosis.    Past Surgical History:  Procedure Laterality Date  . ABDOMINAL HYSTERECTOMY  1974  . BREAST SURGERY Left 1976   lumpectomy  . EYE SURGERY     cataract surgery, both eyes  . FRACTURE SURGERY Right 1971   thumb and index finger  . LEFT HEART CATHETERIZATION WITH CORONARY ANGIOGRAM N/A 09/29/2013   Procedure: LEFT HEART CATHETERIZATION WITH CORONARY ANGIOGRAM;  Surgeon: Peter M Martinique, MD;  Location: [facility] CATH LAB;  Service: Cardiovascular;   Laterality: N/A;  . PARTIAL KNEE ARTHROPLASTY Right 12/31/2014   Procedure: RIGHT KNEE MEDIAL UNICOMPARTMENTAL ARTHROPLASTY;  Surgeon: Gaynelle Arabian, MD;  Location: [facility] ORS;  Service: Orthopedics;  Laterality: Right;    There were no vitals filed for this visit.   Subjective Assessment - 11/18/20 1659    Subjective "I didn't do much"    Patient is accompained by: Family member   [name]   Currently in Pain? No/denies                 ADULT SLP TREATMENT - 11/18/20 1659      General Information   Behavior/Cognition Alert;Cooperative;Pleasant mood      Treatment Provided   Treatment provided Cognitive-Linquistic      Cognitive-Linquistic Treatment   Treatment focused on Aphasia;Cognition;Patient/family/caregiver education    Skilled Treatment Pt completed HEP independently with 89% accuracy achieved for answering simple questions given choice of 3. SLP educated patient on visual AAC board provided with mod A required to comprehend and demo. Pt exhibited significant difficulty naming menu items at favorite restaurant requring mod to max A. SLP recommends pt write out favorite orders at restaurants to practice, as pt unable to verbalize order less than 5 minutes after task. SLP also recommends writing down meals eaten to prompt  recall.      Assessment / Recommendations / Plan   Plan Continue with current plan of care      Progression Toward Goals   Progression toward goals Progressing toward goals            SLP Education - 11/18/20 1751    Education Details HEP, recommendatoins for ordering at [employer]) Educated Patient;Child(ren)    Methods Explanation;Demonstration;Handout    Comprehension Verbalized understanding;Returned demonstration;Need further instruction            SLP Short Term Goals - 11/11/20 1656      SLP SHORT TERM GOAL #1   Title Pt will demonstrate use of word finding compensations in simple 5 minute conversation with min A over 2  sessions    Baseline 11-04-20    Status Partially Met      SLP SHORT TERM GOAL #2   Title Pt's caregiver will appropriately cue patient when word finding episodes occur with rare min A over 2 sessions    Baseline 10-28-20, 10-30-20    Status Achieved      SLP SHORT TERM GOAL #3   Title Pt will generate functional sentences to improve communication of basic wants/needs with min A over 2 sessions    Baseline 11-06-20    Status Partially Met      SLP SHORT TERM GOAL #4   Title Pt will complete formal cognitive linguistic assessment in first 2 sessions    Status Achieved      SLP SHORT TERM GOAL #5   Title Pt will carryover external aids to recall to-do list, appointments and pertinent information with occasional min A over 3 sessions    Status Deferred            SLP Long Term Goals - 11/18/20 1658      SLP LONG TERM GOAL #1   Title Pt will use word finding compensations in 10 to 15 minute mod complex conversation with rare min A over 2 sessions    Time 3    Period Weeks   or 17 visits for all LTGs   Status On-going      SLP LONG TERM GOAL #2   Title Pt will generate functional sentences to improve communication of complex wants/needs with min A over 2 sessions    Time 3    Period Weeks    Status On-going      SLP LONG TERM GOAL #3   Title Pt will carryover external aids to recall to-do list, appointments and pertinent information with rare min A over 3 sessions    Time 3    Period Weeks    Status On-going            Plan - 11/18/20 [place] presents with mild to moderate expressive aphasia and cognitive linguistic deficits. Pt noted with increased dysnomia this session, which may be attributed to later sessions. SLP provided visual low-tech AAC aid for food items given persistent frustration related to naming desired food items, in which pt able to demo with mod A. SLP targeted verbalizing food order at favorite restaurant x1 with pt  requiring mod to max A to complete. 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    Speech Therapy Frequency 2x / week    Duration 8 weeks  or 17 total visits   Treatment/Interventions Compensatory strategies;Patient/family education;Functional tasks;Cueing hierarchy;Multimodal communcation approach;Cognitive reorganization;Compensatory techniques;Internal/external aids;SLP instruction and feedback    Potential to Achieve Goals Good    Potential Considerations Co-morbidities    SLP Home Exercise Plan provided    Consulted and Agree with Plan of Care Patient;Family member/caregiver           Patient will benefit from skilled therapeutic intervention in order to improve the following deficits and impairments:   Aphasia  Cognitive communication deficit    Problem List Patient Active Problem List   Diagnosis Date Noted  . Aphasia 11/04/2020  . Memory loss 11/04/2020  . Vitamin D deficiency 09/30/2020  . CVA (cerebral vascular accident) ([place]) 04/26/2019  . Non-STEMI (non-ST elevated myocardial infarction) ([place])   . Non-obstructive CAD   . Hypertension   . TIA (transient ischemic attack)   . Lacunar infarct, acute ([place]) 10/03/2015  . CKD (chronic kidney disease) stage 3, GFR 30-59 ml/min ([facility]) 10/03/2015  . Acute stroke due to ischemia ([place])   . HLD (hyperlipidemia)   . OA (osteoarthritis) of knee 12/31/2014  . Syncope 09/28/2014  . Hyperlipidemia 04/17/2014  . Atherosclerosis of native coronary artery of native heart without angina pectoris 10/05/2013  . NSTEMI (non-ST elevated myocardial infarction) ([place]) 09/29/2013  . Chest pain 09/28/2013  . Essential hypertension 09/28/2013  . Acute renal failure ([place]) 09/28/2013    Alinda Deem, MA CCC-SLP 11/18/2020, 5:53 PM  [facility] [address] [place], [place], [address] Phone: [phone]   Fax:  [phone]   Name: HARPER VANDERVOORT MRN: [id number] Date of Birth: [birth year]

## 2020-11-18 NOTE — Patient Instructions (Signed)
  Bring your list of favorite things back to therapy on Wednesday  Write out everything you eat and bring that to therapy

## 2020-11-19 ENCOUNTER — Telehealth: Payer: Self-pay | Admitting: Neurology

## 2020-11-19 NOTE — Telephone Encounter (Signed)
Called daughter back. She states company set her mother up with 30 day heart monitor. However, it has a transmitter the size of a cell phone that has to stay within 10 ft of her at all times to accurately pick up readings. She is concerned about her mother losing this d/t cognitive issues. She has lost several cell phones in the past. Wondering if there are other options for her mother.She has had heart monitor done via cardiologist before. Did not have this device. It was much easier to use.  Advised I will discuss with MD and call back.  Reviewed order, sent to [facility] Daft is the contact person there. Phone#[phone]. [employer] [phone]. If preventice call [phone].

## 2020-11-19 NOTE — Telephone Encounter (Signed)
Pt's daughter, Novella Rob (on [place]) called, was reading the instructions for the heart monitor. My mother does not have the mental capability to that. I called the company, they said I could cancel the order. Also suggested I call to let you know. Would like a call from the nurse.

## 2020-11-20 ENCOUNTER — Other Ambulatory Visit: Payer: Self-pay | Admitting: Neurology

## 2020-11-20 ENCOUNTER — Other Ambulatory Visit: Payer: Self-pay

## 2020-11-20 ENCOUNTER — Ambulatory Visit: Payer: PPO

## 2020-11-20 DIAGNOSIS — R4701 Aphasia: Secondary | ICD-10-CM | POA: Diagnosis not present

## 2020-11-20 DIAGNOSIS — R41841 Cognitive communication deficit: Secondary | ICD-10-CM

## 2020-11-20 DIAGNOSIS — I63311 Cerebral infarction due to thrombosis of right middle cerebral artery: Secondary | ICD-10-CM

## 2020-11-20 NOTE — Therapy (Signed)
Crystal Day [address] [place], [place], [address] Phone: [phone]   Fax:  [phone]  Speech Language Pathology Treatment  Patient Details  Name: Crystal Day MRN: [id number] Date of Birth: Feb 08, 1935 Referring Provider (SLP): Ref: Benjiman Core PA (Neuro: Arlice Colt MD)   Encounter Date: 11/20/2020   End of Session - 11/20/20 1845    Visit Number 13    Number of Visits 17    Date for SLP Re-Evaluation 01/08/21    SLP Start Time 1700    SLP Stop Time  0539    SLP Time Calculation (min) 45 min    Activity Tolerance Patient tolerated treatment well           Past Medical History:  Diagnosis Date  . Anginal pain ([place]) since 2014  . Arthritis    just in the hands  . Cancer ([place]) 1988 or 89   basal cell carcinoma on face s/p Mohs procedure  . Complication of anesthesia    nausea  . History of wheezing    in spring and fall  . Hyperlipidemia   . Hypertension   . Non-obstructive CAD    a. 09/2013 Cath/NSTEMI: nonobs dzs.  . Non-STEMI (non-ST elevated myocardial infarction) ([place])    a. 09/2013 - peak trop 5.38;  b. 09/2013 Cath: nonobs dzs-->Med Rx.  . Pneumonia    "in my 20's"  . PONV (postoperative nausea and vomiting)   . Stroke [facility]) 2009   a. 09/2015 L Thalamic Lacunar infarct, presumed to be 2/2 small vessel dzs, residuals include numbness on right side of lips and right finger tips; b. 09/2015 Echo: nl LV fxn; c. 09/2015 Carotid U/S: mild bilat ICA stenosis.    Past Surgical History:  Procedure Laterality Date  . ABDOMINAL HYSTERECTOMY  1974  . BREAST SURGERY Left 1976   lumpectomy  . EYE SURGERY     cataract surgery, both eyes  . FRACTURE SURGERY Right 1971   thumb and index finger  . LEFT HEART CATHETERIZATION WITH CORONARY ANGIOGRAM N/A 09/29/2013   Procedure: LEFT HEART CATHETERIZATION WITH CORONARY ANGIOGRAM;  Surgeon: Peter M Martinique, MD;  Location: [facility] CATH LAB;  Service: Cardiovascular;   Laterality: N/A;  . PARTIAL KNEE ARTHROPLASTY Right 12/31/2014   Procedure: RIGHT KNEE MEDIAL UNICOMPARTMENTAL ARTHROPLASTY;  Surgeon: Gaynelle Arabian, MD;  Location: [facility] ORS;  Service: Orthopedics;  Laterality: Right;    There were no vitals filed for this visit.   Subjective Assessment - 11/20/20 1701    Subjective "I didn't do much"    Patient is accompained by: Family member   [name]   Currently in Pain? No/denies                 ADULT SLP TREATMENT - 11/20/20 0001      General Information   Behavior/Cognition Alert;Cooperative;Pleasant mood      Treatment Provided   Treatment provided Cognitive-Linquistic      Cognitive-Linquistic Treatment   Treatment focused on Aphasia;Cognition;Patient/family/caregiver education    Skilled Treatment Pt completed parts of HEP independently for divergent naming with 94% accuracy achieved with errors x3. Pt able to self-correct error x1 with additional processing time, correct another error with visual cue/first letter, and mod semantic cue for last error. SLP targeted use of multiple visual aids related for favorite foods and items, in which pt able to demo selecting items/categories with usual fading to rare min A. Occasional phonemic errors noted while reading list of favorite items, with  intermittent self-corrections. Pt able to ID restaurant for tonight without cues and verbalize order with mod fading to min A.      Assessment / Recommendations / Plan   Plan Continue with current plan of care      Progression Toward Goals   Progression toward goals Progressing toward goals            SLP Education - 11/20/20 1844    Education Details HEP, visual aids    Person(s) Educated Patient;Child(ren)    Methods Explanation;Demonstration;Handout    Comprehension Verbalized understanding;Returned demonstration;Need further instruction            SLP Short Term Goals - 11/11/20 1656      SLP SHORT TERM GOAL #1   Title Pt will  demonstrate use of word finding compensations in simple 5 minute conversation with min A over 2 sessions    Baseline 11-04-20    Status Partially Met      SLP SHORT TERM GOAL #2   Title Pt's caregiver will appropriately cue patient when word finding episodes occur with rare min A over 2 sessions    Baseline 10-28-20, 10-30-20    Status Achieved      SLP SHORT TERM GOAL #3   Title Pt will generate functional sentences to improve communication of basic wants/needs with min A over 2 sessions    Baseline 11-06-20    Status Partially Met      SLP SHORT TERM GOAL #4   Title Pt will complete formal cognitive linguistic assessment in first 2 sessions    Status Achieved      SLP SHORT TERM GOAL #5   Title Pt will carryover external aids to recall to-do list, appointments and pertinent information with occasional min A over 3 sessions    Status Deferred            SLP Long Term Goals - 11/20/20 1846      SLP LONG TERM GOAL #1   Title Pt will use word finding compensations in 10 to 15 minute mod complex conversation with rare min A over 2 sessions    Time 3    Period Weeks   or 17 visits for all LTGs   Status On-going      SLP LONG TERM GOAL #2   Title Pt will generate functional sentences to improve communication of complex wants/needs with min A over 2 sessions    Baseline 11-20-20    Time 3    Period Weeks    Status On-going      SLP LONG TERM GOAL #3   Title Pt will carryover external aids to recall to-do list, appointments and pertinent information with rare min A over 3 sessions    Time 3    Period Weeks    Status On-going            Plan - 11/20/20 1845    Clinical Impression Statement Crystal Day presents with mild to moderate expressive aphasia and cognitive linguistic deficits. SLP provided further instruction of  low-tech visual aids for favorite items and preferred food items, in which pt able to demo with occasional mod fading to min A. Occasional semantic and phonemic  errors demo'd this session, with occasional self-corrections noted. 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    Speech Therapy Frequency 2x / week  Duration 8 weeks   or 17 total visits   Treatment/Interventions Compensatory strategies;Patient/family education;Functional tasks;Cueing hierarchy;Multimodal communcation approach;Cognitive reorganization;Compensatory techniques;Internal/external aids;SLP instruction and feedback    Potential to Achieve Goals Fair    Potential Considerations Co-morbidities    SLP Home Exercise Plan provided    Consulted and Agree with Plan of Care Patient;Family member/caregiver           Patient will benefit from skilled therapeutic intervention in order to improve the following deficits and impairments:   Aphasia  Cognitive communication deficit    Problem List Patient Active Problem List   Diagnosis Date Noted  . Aphasia 11/04/2020  . Memory loss 11/04/2020  . Vitamin D deficiency 09/30/2020  . CVA (cerebral vascular accident) ([place]) 04/26/2019  . Non-STEMI (non-ST elevated myocardial infarction) ([place])   . Non-obstructive CAD   . Hypertension   . TIA (transient ischemic attack)   . Lacunar infarct, acute ([place]) 10/03/2015  . CKD (chronic kidney disease) stage 3, GFR 30-59 ml/min ([facility]) 10/03/2015  . Acute stroke due to ischemia ([place])   . HLD (hyperlipidemia)   . OA (osteoarthritis) of knee 12/31/2014  . Syncope 09/28/2014  . Hyperlipidemia 04/17/2014  . Atherosclerosis of native coronary artery of native heart without angina pectoris 10/05/2013  . NSTEMI (non-ST elevated myocardial infarction) ([place]) 09/29/2013  . Chest pain 09/28/2013  . Essential hypertension 09/28/2013  . Acute renal failure ([place]) 09/28/2013    Alinda Deem, MA CCC-SLP 11/20/2020, 6:47 PM  C[facility] Day [address] [place], [place], [address] Phone: [phone]   Fax:  [phone]   Name: Crystal Day MRN: [id number] Date of Birth: June 17, 1935

## 2020-11-20 NOTE — Telephone Encounter (Signed)
Dr. Felecia Shelling ordered a cardiac event monitor, which is live, and offers serious or critical notification.  DX: was CVA , monitor to rule our A-Fib.  Monitor company would notifiy [place] right away if patient went into A-Fib. A cardiac event monitor requires a transmitter, which is the cell phone.  The only other alternative without a transmitter  would be the ZIO XT which is a 14 day holter recorder.  Patient would wear patch 14 days and mail back to [facility] for processing.  If patient did have A-Fib, we would not be aware until results received from [facility], (usually 1 week after patient mails back).  In order for [place] to set patient up with alternative, we would need order for cardiac event monitor to be cancelled, (provided the patient has not applied and started the monitor).  We would also need a new order for a [id number] ZIO XT long term monitor to be entered.  Please review with Dr. Felecia Shelling and let us know how you would like [place] to proceed.

## 2020-11-20 NOTE — Telephone Encounter (Signed)
I have placed an order for the 14 day Zio

## 2020-11-21 ENCOUNTER — Telehealth: Payer: Self-pay | Admitting: *Deleted

## 2020-11-21 ENCOUNTER — Other Ambulatory Visit: Payer: Self-pay | Admitting: Neurology

## 2020-11-21 ENCOUNTER — Ambulatory Visit (INDEPENDENT_AMBULATORY_CARE_PROVIDER_SITE_OTHER): Payer: PPO

## 2020-11-21 DIAGNOSIS — I639 Cerebral infarction, unspecified: Secondary | ICD-10-CM

## 2020-11-21 DIAGNOSIS — I63311 Cerebral infarction due to thrombosis of right middle cerebral artery: Secondary | ICD-10-CM

## 2020-11-21 DIAGNOSIS — I4891 Unspecified atrial fibrillation: Secondary | ICD-10-CM

## 2020-11-21 NOTE — Telephone Encounter (Signed)
Dr. Felecia Shelling has changed the type of monitor to a 14 day ZIO XT Long Term Monitor.  Patient enrolled for [employer] to ship the ZIO XT monitor directly to her home.  Instructions reviewed briefly as they will be included in the monitor kit.   There will be no charge for the [employer] event monitor if it has not been applied and turned on , which, daughter states it has not.  Please seal blue box with prepaid UPS label on box and drop off at any UPS store, dropbox, [employer] , or [employer].

## 2020-11-25 ENCOUNTER — Ambulatory Visit: Payer: PPO

## 2020-11-25 ENCOUNTER — Other Ambulatory Visit: Payer: Self-pay

## 2020-11-25 DIAGNOSIS — R41841 Cognitive communication deficit: Secondary | ICD-10-CM

## 2020-11-25 DIAGNOSIS — R4701 Aphasia: Secondary | ICD-10-CM | POA: Diagnosis not present

## 2020-11-25 NOTE — Therapy (Signed)
[facility] [address] [place], [place], [address] Phone: [phone]   Fax:  [phone]  Speech Language Pathology Treatment  Patient Details  Name: Crystal Day MRN: [id number] Date of Birth: 09/19/1934 Referring Provider (SLP): Ref: Quinn, Kiera PA (Neuro: Sater, Richard MD)   Encounter Date: 11/25/2020   End of Session - 11/25/20 1723    Visit Number 14    Number of Visits 17    Date for SLP Re-Evaluation 01/08/21    SLP Start Time 1705    SLP Stop Time  1751    SLP Time Calculation (min) 46 min    Activity Tolerance Patient tolerated treatment well           Past Medical History:  Diagnosis Date  . Anginal pain ([facility]) since 2014  . Arthritis    just in the hands  . Cancer ([facility]) 1988 or 89   basal cell carcinoma on face s/p Mohs procedure  . Complication of anesthesia    nausea  . History of wheezing    in spring and fall  . Hyperlipidemia   . Hypertension   . Non-obstructive CAD    a. 09/2013 Cath/NSTEMI: nonobs dzs.  . Non-STEMI (non-ST elevated myocardial infarction) ([facility])    a. 09/2013 - peak trop 5.38;  b. 09/2013 Cath: nonobs dzs-->Med Rx.  . Pneumonia    "in my 20's"  . PONV (postoperative nausea and vomiting)   . Stroke ([facility]) 2009   a. 09/2015 L Thalamic Lacunar infarct, presumed to be 2/2 small vessel dzs, residuals include numbness on right side of lips and right finger tips; b. 09/2015 Echo: nl LV fxn; c. 09/2015 Carotid U/S: mild bilat ICA stenosis.    Past Surgical History:  Procedure Laterality Date  . ABDOMINAL HYSTERECTOMY  1974  . BREAST SURGERY Left 1976   lumpectomy  . EYE SURGERY     cataract surgery, both eyes  . FRACTURE SURGERY Right 1971   thumb and index finger  . LEFT HEART CATHETERIZATION WITH CORONARY ANGIOGRAM N/A 09/29/2013   Procedure: LEFT HEART CATHETERIZATION WITH CORONARY ANGIOGRAM;  Surgeon: Peter M Jordan, MD;  Location: [facility] CATH LAB;  Service: Cardiovascular;   Laterality: N/A;  . PARTIAL KNEE ARTHROPLASTY Right 12/31/2014   Procedure: RIGHT KNEE MEDIAL UNICOMPARTMENTAL ARTHROPLASTY;  Surgeon: Frank Aluisio, MD;  Location: [facility];  Service: Orthopedics;  Laterality: Right;    There were no vitals filed for this visit.   Subjective Assessment - 11/25/20 1705    Subjective "I have it in here" re: written visual aid in purse    Patient is accompained by: Family member    Currently in Pain? No/denies                 ADULT SLP TREATMENT - 11/25/20 1706      General Information   Behavior/Cognition Alert;Cooperative;Pleasant mood      Treatment Provided   Treatment provided Cognitive-Linquistic      Cognitive-Linquistic Treatment   Treatment focused on Aphasia;Cognition;Patient/family/caregiver education    Skilled Treatment Pt completed fill-in blank parapraphs with min A from daughter. Pt required mod to max cues for naming in abstract categories. SLP targeted generative naming for simple sentences, with pt required rare min to usual max cues to reduce perservations and create factual sentences for recall purposes. SLP trialed writing sentences to improve generative naming, in which improved MLU and less perservations noted. SLP provided additional formulating sentences as HWK.        Assessment / Recommendations / Plan   Plan Continue with current plan of care      Progression Toward Goals   Progression toward goals Progressing toward goals              SLP Short Term Goals - 11/11/20 1656      SLP SHORT TERM GOAL #1   Title Pt will demonstrate use of word finding compensations in simple 5 minute conversation with min A over 2 sessions    Baseline 11-04-20    Status Partially Met      SLP SHORT TERM GOAL #2   Title Pt's caregiver will appropriately cue patient when word finding episodes occur with rare min A over 2 sessions    Baseline 10-28-20, 10-30-20    Status Achieved      SLP SHORT TERM GOAL #3   Title Pt will  generate functional sentences to improve communication of basic wants/needs with min A over 2 sessions    Baseline 11-06-20    Status Partially Met      SLP SHORT TERM GOAL #4   Title Pt will complete formal cognitive linguistic assessment in first 2 sessions    Status Achieved      SLP SHORT TERM GOAL #5   Title Pt will carryover external aids to recall to-do list, appointments and pertinent information with occasional min A over 3 sessions    Status Deferred            SLP Long Term Goals - 11/25/20 1724      SLP LONG TERM GOAL #1   Title Pt will use word finding compensations in 10 to 15 minute mod complex conversation with rare min A over 2 sessions    Time 2    Period Weeks   or 17 visits for all LTGs   Status On-going      SLP LONG TERM GOAL #2   Title Pt will generate functional sentences to improve communication of complex wants/needs with min A over 2 sessions    Baseline 11-20-20    Time 2    Period Weeks    Status On-going      SLP LONG TERM GOAL #3   Title Pt will carryover external aids to recall to-do list, appointments and pertinent information with rare min A over 3 sessions    Time 2    Period Weeks    Status On-going            Plan - 11/25/20 [place] presents with mild to moderate expressive aphasia and cognitive linguistic deficits. SLP targeted spontaneous generation of sentences of specific word for both verbal output and writing, in which writing reduced perseverations, increased MLU, and comprehension. 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    Speech Therapy Frequency 2x / week    Duration 8 weeks    Treatment/Interventions Compensatory strategies;Patient/family education;Functional tasks;Cueing hierarchy;Multimodal communcation approach;Cognitive  reorganization;Compensatory techniques;Internal/external aids;SLP instruction and feedback    Potential to Achieve Goals Fair    Potential Considerations Co-morbidities    SLP Home Exercise Plan provided    Consulted and Agree with Plan of Care Patient;Family member/caregiver           Patient will benefit from skilled therapeutic intervention in order to improve the following deficits and impairments:   Aphasia  Cognitive communication deficit    Problem List Patient Active Problem List   Diagnosis Date Noted  . Aphasia 11/04/2020  . Memory loss 11/04/2020  . Vitamin D deficiency 09/30/2020  . CVA (cerebral vascular accident) ([facility]) 04/26/2019  . Non-STEMI (non-ST elevated myocardial infarction) ([facility])   . Non-obstructive CAD   . Hypertension   . TIA (transient ischemic attack)   . Lacunar infarct, acute ([facility]) 10/03/2015  . CKD (chronic kidney disease) stage 3, GFR 30-59 ml/min ([facility]) 10/03/2015  . Acute stroke due to ischemia ([facility])   . HLD (hyperlipidemia)   . OA (osteoarthritis) of knee 12/31/2014  . Syncope 09/28/2014  . Hyperlipidemia 04/17/2014  . Atherosclerosis of native coronary artery of native heart without angina pectoris 10/05/2013  . NSTEMI (non-ST elevated myocardial infarction) ([facility]) 09/29/2013  . Chest pain 09/28/2013  . Essential hypertension 09/28/2013  . Acute renal failure ([facility]) 09/28/2013     V , MA CCC-SLP 11/25/2020, 5:58 PM  [facility] [address] [place], [place], [address] Phone: [phone]   Fax:  [phone]   Name: Dennette L Eckstrom MRN: [id number] Date of Birth: 05/17/1935 

## 2020-11-27 ENCOUNTER — Ambulatory Visit: Payer: PPO

## 2020-11-27 ENCOUNTER — Other Ambulatory Visit: Payer: Self-pay

## 2020-11-27 DIAGNOSIS — R4701 Aphasia: Secondary | ICD-10-CM

## 2020-11-27 DIAGNOSIS — R41841 Cognitive communication deficit: Secondary | ICD-10-CM

## 2020-11-27 NOTE — Therapy (Signed)
[facility] [address] [place], [place], [address] Phone: [phone]   Fax:  [phone]  Speech Language Pathology Treatment  Patient Details  Name: Crystal Day MRN: [id number] Date of Birth: 1935-02-10 Referring Provider (SLP): Ref: Benjiman Core PA (Neuro: Arlice Colt MD)   Encounter Date: 11/27/2020   End of Session - 11/27/20 1957    Visit Number 15    Number of Visits 17    Date for SLP Re-Evaluation 01/08/21    SLP Start Time 1700    SLP Stop Time  5056    SLP Time Calculation (min) 48 min    Activity Tolerance Patient tolerated treatment well           Past Medical History:  Diagnosis Date  . Anginal pain ([place]) since 2014  . Arthritis    just in the hands  . Cancer ([place]) 1988 or 89   basal cell carcinoma on face s/p Mohs procedure  . Complication of anesthesia    nausea  . History of wheezing    in spring and fall  . Hyperlipidemia   . Hypertension   . Non-obstructive CAD    a. 09/2013 Cath/NSTEMI: nonobs dzs.  . Non-STEMI (non-ST elevated myocardial infarction) ([place])    a. 09/2013 - peak trop 5.38;  b. 09/2013 Cath: nonobs dzs-->Med Rx.  . Pneumonia    "in my 20's"  . PONV (postoperative nausea and vomiting)   . Stroke [facility]) 2009   a. 09/2015 L Thalamic Lacunar infarct, presumed to be 2/2 small vessel dzs, residuals include numbness on right side of lips and right finger tips; b. 09/2015 Echo: nl LV fxn; c. 09/2015 Carotid U/S: mild bilat ICA stenosis.    Past Surgical History:  Procedure Laterality Date  . ABDOMINAL HYSTERECTOMY  1974  . BREAST SURGERY Left 1976   lumpectomy  . EYE SURGERY     cataract surgery, both eyes  . FRACTURE SURGERY Right 1971   thumb and index finger  . LEFT HEART CATHETERIZATION WITH CORONARY ANGIOGRAM N/A 09/29/2013   Procedure: LEFT HEART CATHETERIZATION WITH CORONARY ANGIOGRAM;  Surgeon: Peter M Martinique, MD;  Location: [facility] CATH LAB;  Service: Cardiovascular;   Laterality: N/A;  . PARTIAL KNEE ARTHROPLASTY Right 12/31/2014   Procedure: RIGHT KNEE MEDIAL UNICOMPARTMENTAL ARTHROPLASTY;  Surgeon: Gaynelle Arabian, MD;  Location: [facility] ORS;  Service: Orthopedics;  Laterality: Right;    There were no vitals filed for this visit.   Subjective Assessment - 11/27/20 1701    Subjective 'I ironed... no I pushed the lawnmower... No I rode the Conservation officer, nature (cue)"    Patient is accompained by: Family member   [name]   Currently in Pain? No/denies                 ADULT SLP TREATMENT - 11/27/20 1702      General Information   Behavior/Cognition Alert;Cooperative;Pleasant mood      Treatment Provided   Treatment provided Cognitive-Linquistic      Cognitive-Linquistic Treatment   Treatment focused on Aphasia;Cognition;Patient/family/caregiver education    Skilled Treatment Pt returned HEP from last session, with pt sorting words into 6 concrete categories. Pt's daughter provided usual mod A as pt perseverated and put "nickle and dime" in every category. Max A required to complete HEP for formulating sentences with specific words, as pt's daughter indicated patient consistently initated sentence with same opener ("I like..."). SLP targeted functional conversation re: pt's yard, with usual mod A required to  expand upon responses and provide specific indentifying information due to prevelance of "empty speech." SLP encouraged patient to return to social situations to practice speech, as pt reportedly has been avoiding church due to her aphasia. Pt endorsed she would go to church this Sunday to eliminate avoiding behaviors.      Assessment / Recommendations / Plan   Plan Continue with current plan of care      Progression Toward Goals   Progression toward goals Goals downgraded            SLP Education - 11/27/20 1956    Education Details forming sentences, not avoiding social situations due to aphasis, conversational starters/practice    Person(s) Educated  Patient;Child(ren)    Methods Explanation;Demonstration;Handout;Verbal cues    Comprehension Verbalized understanding;Returned demonstration;Need further instruction            SLP Short Term Goals - 11/11/20 1656      SLP SHORT TERM GOAL #1   Title Pt will demonstrate use of word finding compensations in simple 5 minute conversation with min A over 2 sessions    Baseline 11-04-20    Status Partially Met      SLP SHORT TERM GOAL #2   Title Pt's caregiver will appropriately cue patient when word finding episodes occur with rare min A over 2 sessions    Baseline 10-28-20, 10-30-20    Status Achieved      SLP SHORT TERM GOAL #3   Title Pt will generate functional sentences to improve communication of basic wants/needs with min A over 2 sessions    Baseline 11-06-20    Status Partially Met      SLP SHORT TERM GOAL #4   Title Pt will complete formal cognitive linguistic assessment in first 2 sessions    Status Achieved      SLP SHORT TERM GOAL #5   Title Pt will carryover external aids to recall to-do list, appointments and pertinent information with occasional min A over 3 sessions    Status Deferred            SLP Long Term Goals - 11/27/20 1957      SLP LONG TERM GOAL #1   Title Pt will use word finding compensations in 10 to 15 simple conversation with occasional min A over 2 sessions   downgraded   Time 2    Period Weeks   or 17 visits for all LTGs   Status Revised      SLP LONG TERM GOAL #2   Title Pt will generate functional sentences to improve communication of simple to mod complex wants/needs with min A over 2 sessions   downgraded   Baseline 11-20-20    Time 2    Period Weeks    Status Revised      SLP LONG TERM GOAL #3   Title Pt will carryover external aids to recall to-do list, appointments and pertinent information with rare min A over 3 sessions    Time 2    Period Weeks    Status Deferred            Plan - 11/27/20 2003    Clinical Impression  Statement Deasiah presents with persisting mild to moderate expressive aphasia and cognitive linguistic deficits. SLP targeted spontaneous verbalizations in simple personally relevant conversations, with mod to max prompting and cues required to specifiy and expand responses due to consistent "empty speech" secondary to word finding. LTGs downgraded due to pt continuing to exhibit difficulty  with more basic communication of wants/needs/personal information in conversation with consistent reliance on cuing. 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    Speech Therapy Frequency 2x / week    Duration 8 weeks   or 17 total visits   Treatment/Interventions Compensatory strategies;Patient/family education;Functional tasks;Cueing hierarchy;Multimodal communcation approach;Cognitive reorganization;Compensatory techniques;Internal/external aids;SLP instruction and feedback    Potential to Achieve Goals Fair    Potential Considerations Co-morbidities    SLP Home Exercise Plan provided    Consulted and Agree with Plan of Care Patient;Family member/caregiver           Patient will benefit from skilled therapeutic intervention in order to improve the following deficits and impairments:   Aphasia  Cognitive communication deficit    Problem List Patient Active Problem List   Diagnosis Date Noted  . Aphasia 11/04/2020  . Memory loss 11/04/2020  . Vitamin D deficiency 09/30/2020  . CVA (cerebral vascular accident) ([place]) 04/26/2019  . Non-STEMI (non-ST elevated myocardial infarction) ([place])   . Non-obstructive CAD   . Hypertension   . TIA (transient ischemic attack)   . Lacunar infarct, acute ([place]) 10/03/2015  . CKD (chronic kidney disease) stage 3, GFR 30-59 ml/min ([facility]) 10/03/2015  . Acute stroke due to ischemia ([place])   . HLD  (hyperlipidemia)   . OA (osteoarthritis) of knee 12/31/2014  . Syncope 09/28/2014  . Hyperlipidemia 04/17/2014  . Atherosclerosis of native coronary artery of native heart without angina pectoris 10/05/2013  . NSTEMI (non-ST elevated myocardial infarction) ([place]) 09/29/2013  . Chest pain 09/28/2013  . Essential hypertension 09/28/2013  . Acute renal failure ([place]) 09/28/2013    Alinda Deem, MA CCC-SLP 11/27/2020, 8:10 PM  [facility] [address] [place], [place], [address] Phone: [phone]   Fax:  [phone]   Name: EVANGELA HEFFLER MRN: [id number] Date of Birth: Sep 07, 1934

## 2020-11-29 DIAGNOSIS — I639 Cerebral infarction, unspecified: Secondary | ICD-10-CM | POA: Diagnosis not present

## 2020-11-29 DIAGNOSIS — I63311 Cerebral infarction due to thrombosis of right middle cerebral artery: Secondary | ICD-10-CM | POA: Diagnosis not present

## 2020-11-29 DIAGNOSIS — I4891 Unspecified atrial fibrillation: Secondary | ICD-10-CM | POA: Diagnosis not present

## 2020-12-02 ENCOUNTER — Other Ambulatory Visit: Payer: Self-pay

## 2020-12-02 ENCOUNTER — Ambulatory Visit: Payer: PPO

## 2020-12-02 DIAGNOSIS — R4701 Aphasia: Secondary | ICD-10-CM

## 2020-12-02 DIAGNOSIS — R41841 Cognitive communication deficit: Secondary | ICD-10-CM

## 2020-12-02 NOTE — Patient Instructions (Signed)
  Write down directions to get to your favorite restaurants               Write down some personal information   -My birthday is...  -My phone number...  -My address is.Marland KitchenMarland Kitchen

## 2020-12-02 NOTE — Therapy (Signed)
[facility] [address] [place], [place], [address] Phone: [phone]   Fax:  [phone]  Speech Language Pathology Treatment  Patient Details  Name: Crystal Day MRN: [id number] Date of Birth: 1935/04/02 Referring Provider (SLP): Ref: Crystal Core PA (Neuro: Crystal Colt MD)   Encounter Date: 12/02/2020   End of Session - 12/02/20 1651    Visit Number 16    Number of Visits 17    Date for SLP Re-Evaluation 01/08/21    SLP Start Time 74    SLP Stop Time  2202    SLP Time Calculation (min) 45 min    Activity Tolerance Patient tolerated treatment well           Past Medical History:  Diagnosis Date  . Anginal pain ([place]) since 2014  . Arthritis    just in the hands  . Cancer ([place]) 1988 or 89   basal cell carcinoma on face s/p Mohs procedure  . Complication of anesthesia    nausea  . History of wheezing    in spring and fall  . Hyperlipidemia   . Hypertension   . Non-obstructive CAD    a. 09/2013 Cath/NSTEMI: nonobs dzs.  . Non-STEMI (non-ST elevated myocardial infarction) ([place])    a. 09/2013 - peak trop 5.38;  b. 09/2013 Cath: nonobs dzs-->Med Rx.  . Pneumonia    "in my 20's"  . PONV (postoperative nausea and vomiting)   . Stroke [facility]) 2009   a. 09/2015 L Thalamic Lacunar infarct, presumed to be 2/2 small vessel dzs, residuals include numbness on right side of lips and right finger tips; b. 09/2015 Echo: nl LV fxn; c. 09/2015 Carotid U/S: mild bilat ICA stenosis.    Past Surgical History:  Procedure Laterality Date  . ABDOMINAL HYSTERECTOMY  1974  . BREAST SURGERY Left 1976   lumpectomy  . EYE SURGERY     cataract surgery, both eyes  . FRACTURE SURGERY Right 1971   thumb and index finger  . LEFT HEART CATHETERIZATION WITH CORONARY ANGIOGRAM N/A 09/29/2013   Procedure: LEFT HEART CATHETERIZATION WITH CORONARY ANGIOGRAM;  Surgeon: Peter M Martinique, MD;  Location: [facility] CATH LAB;  Service: Cardiovascular;   Laterality: N/A;  . PARTIAL KNEE ARTHROPLASTY Right 12/31/2014   Procedure: RIGHT KNEE MEDIAL UNICOMPARTMENTAL ARTHROPLASTY;  Surgeon: Gaynelle Arabian, MD;  Location: [facility];  Service: Orthopedics;  Laterality: Right;    There were no vitals filed for this visit.   Subjective Assessment - 12/02/20 1657    Subjective "nothing"    Patient is accompained by: Family member   daughter, [name]   Currently in Pain? No/denies                 ADULT SLP TREATMENT - 12/02/20 1654      General Information   Behavior/Cognition Alert;Cooperative;Confused;Agitated      Treatment Provided   Treatment provided Cognitive-Linquistic      Cognitive-Linquistic Treatment   Treatment focused on Aphasia;Cognition;Patient/family/caregiver education    Skilled Treatment Pt notably frustrated with her aphasia this session, requiring frequent min to mod SLP cues to reduce tension and aid verbal communication. Pt able to describe locations and names of favorite places with approximately 75% accuracy given additional processing time. SLP targeted functional communication for naming personal information that should be more automatic. SLP recommended practicing scripted sentences 1-2x/day to improve verbal output.      Assessment / Recommendations / Plan   Plan Continue with current plan of care  Progression Toward Goals   Progression toward goals Progressing toward goals            SLP Education - 12/02/20 1657    Education Details HEP, functional communication    Person(s) Educated Patient;Child(ren)    Methods Explanation;Demonstration;Handout;Verbal cues    Comprehension Verbalized understanding;Returned demonstration;Verbal cues required;Need further instruction            SLP Short Term Goals - 11/11/20 1656      SLP SHORT TERM GOAL #1   Title Pt will demonstrate use of word finding compensations in simple 5 minute conversation with min A over 2 sessions    Baseline 11-04-20     Status Partially Met      SLP SHORT TERM GOAL #2   Title Pt's caregiver will appropriately cue patient when word finding episodes occur with rare min A over 2 sessions    Baseline 10-28-20, 10-30-20    Status Achieved      SLP SHORT TERM GOAL #3   Title Pt will generate functional sentences to improve communication of basic wants/needs with min A over 2 sessions    Baseline 11-06-20    Status Partially Met      SLP SHORT TERM GOAL #4   Title Pt will complete formal cognitive linguistic assessment in first 2 sessions    Status Achieved      SLP SHORT TERM GOAL #5   Title Pt will carryover external aids to recall to-do list, appointments and pertinent information with occasional min A over 3 sessions    Status Deferred            SLP Long Term Goals - 12/02/20 1651      SLP LONG TERM GOAL #1   Title Pt will use word finding compensations in 10 to 15 simple conversation with occasional min A over 2 sessions   downgraded   Time 1    Period Weeks   or 17 visits for all LTGs   Status On-going      SLP LONG TERM GOAL #2   Title Pt will generate functional sentences to improve communication of simple to mod complex wants/needs with min A over 2 sessions   downgraded   Baseline 11-20-20    Time 1    Period Weeks    Status On-going      SLP LONG TERM GOAL #3   Title Pt will carryover external aids to recall to-do list, appointments and pertinent information with rare min A over 3 sessions    Time 1    Period Weeks    Status Deferred            Plan - 12/02/20 1751    Clinical Impression Statement [name] presents with persisting mild to moderate expressive aphasia and cognitive linguistic deficits. SLP targeted personal facts and specific locations of favorite restaurants, with pt notably reliant on gestures and frequently presented with  "empty speech" secondary to word finding. SLP provided frequent cues and education to reduce tension and frustration associated with tasks and to  aid verbal output. 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    Speech Therapy Frequency 2x / week    Duration 8 weeks   or 17 total visits   Treatment/Interventions Compensatory strategies;Patient/family education;Functional tasks;Cueing hierarchy;Multimodal communcation approach;Cognitive reorganization;Compensatory techniques;Internal/external aids;SLP instruction and feedback  Potential to Achieve Goals Fair    Potential Considerations Co-morbidities    SLP Home Exercise Plan provided    Consulted and Agree with Plan of Care Patient;Family member/caregiver           Patient will benefit from skilled therapeutic intervention in order to improve the following deficits and impairments:   Aphasia  Cognitive communication deficit    Problem List Patient Active Problem List   Diagnosis Date Noted  . Aphasia 11/04/2020  . Memory loss 11/04/2020  . Vitamin D deficiency 09/30/2020  . CVA (cerebral vascular accident) ([place]) 04/26/2019  . Non-STEMI (non-ST elevated myocardial infarction) ([place])   . Non-obstructive CAD   . Hypertension   . TIA (transient ischemic attack)   . Lacunar infarct, acute ([place]) 10/03/2015  . CKD (chronic kidney disease) stage 3, GFR 30-59 ml/min ([facility]) 10/03/2015  . Acute stroke due to ischemia ([place])   . HLD (hyperlipidemia)   . OA (osteoarthritis) of knee 12/31/2014  . Syncope 09/28/2014  . Hyperlipidemia 04/17/2014  . Atherosclerosis of native coronary artery of native heart without angina pectoris 10/05/2013  . NSTEMI (non-ST elevated myocardial infarction) ([place]) 09/29/2013  . Chest pain 09/28/2013  . Essential hypertension 09/28/2013  . Acute renal failure ([place]) 09/28/2013    Alinda Deem, MA CCC-SLP 12/02/2020, 5:54 PM  [facility] [address] [place], [place], [address] Phone: [phone]   Fax:  [phone]   Name: Crystal Day MRN: [id number] Date of Birth: 01/22/1935

## 2020-12-04 ENCOUNTER — Other Ambulatory Visit: Payer: Self-pay

## 2020-12-04 ENCOUNTER — Ambulatory Visit: Payer: PPO

## 2020-12-04 DIAGNOSIS — R1312 Dysphagia, oropharyngeal phase: Secondary | ICD-10-CM

## 2020-12-04 DIAGNOSIS — R4701 Aphasia: Secondary | ICD-10-CM | POA: Diagnosis not present

## 2020-12-04 DIAGNOSIS — R41841 Cognitive communication deficit: Secondary | ICD-10-CM

## 2020-12-04 NOTE — Patient Instructions (Addendum)
  Pick 1 person a day to call on the telephone   Read aloud magazine and novels for 5-10 minutes each day    Read these sentences:  I like to shop at [facility].  I eat lunch around 12 o'clock.  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  I like the fall because I like the leaves and the cooler weather.   I grew up in [place], [place].    I spend time with [name] mostly.  I like all the old stuff, such as [employer], [employer], and [employer]   I lay in bed until about 10 o'clock.  I am going to the grocery store.

## 2020-12-04 NOTE — Therapy (Signed)
[facility] [address] [place], [place], [address] Phone: [phone]   Fax:  [phone]  Speech Language Pathology Treatment- Renewal Summary  Patient Details  Name: Crystal Day MRN: [id number] Date of Birth: 13-Feb-1935 Referring Provider (SLP): Ref: Benjiman Core PA (Neuro: Arlice Colt MD)   Encounter Date: 12/04/2020   End of Session - 12/04/20 1732    Visit Number 17    Number of Visits 29    Date for SLP Re-Evaluation 02/02/21    SLP Start Time 1751    SLP Stop Time  0258    SLP Time Calculation (min) 43 min    Activity Tolerance Patient tolerated treatment well           Past Medical History:  Diagnosis Date  . Anginal pain ([place]) since 2014  . Arthritis    just in the hands  . Cancer ([place]) 1988 or 89   basal cell carcinoma on face s/p Mohs procedure  . Complication of anesthesia    nausea  . History of wheezing    in spring and fall  . Hyperlipidemia   . Hypertension   . Non-obstructive CAD    a. 09/2013 Cath/NSTEMI: nonobs dzs.  . Non-STEMI (non-ST elevated myocardial infarction) ([place])    a. 09/2013 - peak trop 5.38;  b. 09/2013 Cath: nonobs dzs-->Med Rx.  . Pneumonia    "in my 20's"  . PONV (postoperative nausea and vomiting)   . Stroke [facility]) 2009   a. 09/2015 L Thalamic Lacunar infarct, presumed to be 2/2 small vessel dzs, residuals include numbness on right side of lips and right finger tips; b. 09/2015 Echo: nl LV fxn; c. 09/2015 Carotid U/S: mild bilat ICA stenosis.    Past Surgical History:  Procedure Laterality Date  . ABDOMINAL HYSTERECTOMY  1974  . BREAST SURGERY Left 1976   lumpectomy  . EYE SURGERY     cataract surgery, both eyes  . FRACTURE SURGERY Right 1971   thumb and index finger  . LEFT HEART CATHETERIZATION WITH CORONARY ANGIOGRAM N/A 09/29/2013   Procedure: LEFT HEART CATHETERIZATION WITH CORONARY ANGIOGRAM;  Surgeon: Peter M Martinique, MD;  Location: [facility] CATH LAB;   Service: Cardiovascular;  Laterality: N/A;  . PARTIAL KNEE ARTHROPLASTY Right 12/31/2014   Procedure: RIGHT KNEE MEDIAL UNICOMPARTMENTAL ARTHROPLASTY;  Surgeon: Gaynelle Arabian, MD;  Location: [facility] ORS;  Service: Orthopedics;  Laterality: Right;    There were no vitals filed for this visit.   Subjective Assessment - 12/04/20 1703    Subjective "it's the same"    Patient is accompained by: Family member   [name]   Currently in Pain? No/denies                 ADULT SLP TREATMENT - 12/04/20 1703      General Information   Behavior/Cognition Alert;Cooperative;Confused;Pleasant mood      Treatment Provided   Treatment provided Cognitive-Linquistic;Dysphagia      Dysphagia Treatment   Treatment Methods Skilled observation;Compensation strategy training;Patient/caregiver education    Other treatment/comments Pt recently c/o increased difficulty swallowing, particularly water. SLP completed bedside swallow evaluation of regular textures and thin liquids via cup and straw, with no overt s/sx of aspiration. Pt had previously reported incident of feeling "strangled" with large multivitamin pill, which they now crush. Pt may require instrumental swallow study to evaluate pharygneal function and assess for risk of aspiration given compliants.      Cognitive-Linquistic Treatment   Treatment focused on Aphasia;Cognition;Patient/family/caregiver  education    Skilled Treatment Pt's daughter reported pt unable to complete HEP to ID synonyms. Min to mod A required to ID opposites. SLP targeted spontaneous generation of sentences given prompted word, which required mod to max A given pt perseverated on same sentence starter. SLP targeted spontenous conversation, with occasional min to mod A required to answer simple open-ended questions. Pt noted with frequent reliance on daughter for cues/answers and exhibits intermittent frustration related to anomia. SLP discussed re-certification for additional 12  visits at 2x/week for 6 weeks due to pt has not yet met rehab potential.      Assessment / Recommendations / [place] with current plan of care;Goals updated      Progression Toward Goals   Progression toward goals Progressing toward goals   slow progression             SLP Short Term Goals - 11/11/20 1656      SLP SHORT TERM GOAL #1   Title Pt will demonstrate use of word finding compensations in simple 5 minute conversation with min A over 2 sessions    Baseline 11-04-20    Status Partially Met      SLP SHORT TERM GOAL #2   Title Pt's caregiver will appropriately cue patient when word finding episodes occur with rare min A over 2 sessions    Baseline 10-28-20, 10-30-20    Status Achieved      SLP SHORT TERM GOAL #3   Title Pt will generate functional sentences to improve communication of basic wants/needs with min A over 2 sessions    Baseline 11-06-20    Status Partially Met      SLP SHORT TERM GOAL #4   Title Pt will complete formal cognitive linguistic assessment in first 2 sessions    Status Achieved      SLP SHORT TERM GOAL #5   Title Pt will carryover external aids to recall to-do list, appointments and pertinent information with occasional min A over 3 sessions    Status Deferred            SLP Long Term Goals - 12/04/20 1731      SLP LONG TERM GOAL #1   Title Pt will use word finding compensations in 10  simple conversation with occasional min A over 2 sessions   downgraded   Time 6    Period Weeks   or 29 visits for all LTGs   Status Revised      SLP LONG TERM GOAL #2   Title Pt will generate functional sentences to improve communication of simple wants/needs with min A over 2 sessions   downgraded   Baseline 11-20-20    Time 6    Period Weeks    Status Revised      SLP LONG TERM GOAL #3   Title Pt will use external aids to recall to-do list, appointments and pertinent information with rare min A over 3 sessions    Time 6    Period Weeks     Status Revised      SLP LONG TERM GOAL #4   Title Pt will use multimodal communication to augment verbal expression of wants/needs with usual min A over 2 sessions    Time 6    Period Weeks    Status New      SLP LONG TERM GOAL #5   Title Pt will undergo objective swallow study to evaluate pharyngeal function and assess for risk  of aspiration if ongoing swallow deficits reported    Time [place] - 12/04/20 1751    Clinical Impression Statement Crystal Day presents with persisting mild to moderate expressive aphasia and cognitive linguistic deficits. SLP targeted spontaneous generation of sentences, with mod to max A required to elicit sentences. Pt continues to utilize empty speech and relies on daughter to communicate basic wants/needs related to personal life. SLP recommends more independent completion of HEP at home, including reading aloud and speaking on phone with friends as pt is currently avoiding communication opportunities due to aphasia. Pt also recently c/o of recent increased difficulty swallowing, particularly water. SLP completed bedside swallow assessment of regular and thin liquids via cup and straw with no overt s/sx of aspiration. However, pt may warrant an instrumental swallow study to evaluate pharygneal function and assess for risk for aspiration. POC and goals updated to include dysphagia. Given pt has not met rehab potential and continues to exhibit difficulty with basic communication resulting in avoiding situations and now reports swallowing difficulty, SLP recommends additional 12 ST visits at 2x/week for 6 weeks. 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    Speech Therapy Frequency 2x / week    Duration Other (comment)   6 weeks or 29 total visits    Treatment/Interventions Compensatory strategies;Patient/family education;Functional tasks;Cueing hierarchy;Multimodal communcation approach;Cognitive reorganization;Compensatory techniques;Internal/external aids;SLP instruction and feedback;Aspiration precaution training;Pharyngeal strengthening exercises;Diet toleration management by SLP    Potential to Achieve Goals Fair    Potential Considerations Ability to learn/carryover information;Severity of impairments    SLP Home Exercise Plan provided    Consulted and Agree with Plan of Care Patient;Family member/caregiver           Patient will benefit from skilled therapeutic intervention in order to improve the following deficits and impairments:   Aphasia  Cognitive communication deficit  Dysphagia, oropharyngeal phase    Problem List Patient Active Problem List   Diagnosis Date Noted  . Aphasia 11/04/2020  . Memory loss 11/04/2020  . Vitamin D deficiency 09/30/2020  . CVA (cerebral vascular accident) ([place]) 04/26/2019  . Non-STEMI (non-ST elevated myocardial infarction) ([place])   . Non-obstructive CAD   . Hypertension   . TIA (transient ischemic attack)   . Lacunar infarct, acute ([place]) 10/03/2015  . CKD (chronic kidney disease) stage 3, GFR 30-59 ml/min ([facility]) 10/03/2015  . Acute stroke due to ischemia ([place])   . HLD (hyperlipidemia)   . OA (osteoarthritis) of knee 12/31/2014  . Syncope 09/28/2014  . Hyperlipidemia 04/17/2014  . Atherosclerosis of native coronary artery of native heart without angina pectoris 10/05/2013  . NSTEMI (non-ST elevated myocardial infarction) ([place]) 09/29/2013  . Chest pain 09/28/2013  . Essential hypertension 09/28/2013  . Acute renal failure ([place]) 09/28/2013    Alinda Deem, MA CCC-SLP 12/04/2020, 6:16 PM  [facility] [address] [place], [place], [address] Phone: [phone]   Fax:  [phone]   Name: Crystal Day MRN:  [id number] Date of Birth: 04-18-1935

## 2020-12-09 ENCOUNTER — Other Ambulatory Visit: Payer: Self-pay

## 2020-12-09 ENCOUNTER — Ambulatory Visit: Payer: PPO

## 2020-12-09 DIAGNOSIS — R4701 Aphasia: Secondary | ICD-10-CM

## 2020-12-09 DIAGNOSIS — R41841 Cognitive communication deficit: Secondary | ICD-10-CM

## 2020-12-09 NOTE — Patient Instructions (Signed)
  Call [facility] 9-1-1 [phone] and let them know you have aphasia and may not be able to communicate easily in an emergency   Conversational Questions for [facility]:  How are you doing? How are your grandchildren?    Blanch Media: How are things going at the house?  Betty: How is your husband Ron?  Pat:   Tye MarylandJackelyn PolingBethena Roys:

## 2020-12-09 NOTE — Therapy (Signed)
[facility] [address] [place], [place], [address] Phone: [phone]   Fax:  [phone]  Speech Language Pathology Treatment  Patient Details  Name: Crystal Day MRN: [id number] Date of Birth: [birth year] Referring Provider (SLP): Ref: Benjiman Core PA (Neuro: Arlice Colt MD)   Encounter Date: 12/09/2020   End of Session - 12/09/20 2028    Visit Number 18    Number of Visits 29    Date for SLP Re-Evaluation 02/02/21    SLP Start Time 1745    SLP Stop Time  8756    SLP Time Calculation (min) 45 min    Activity Tolerance Patient tolerated treatment well           Past Medical History:  Diagnosis Date  . Anginal pain ([place]) since 2014  . Arthritis    just in the hands  . Cancer ([place]) 1988 or 89   basal cell carcinoma on face s/p Mohs procedure  . Complication of anesthesia    nausea  . History of wheezing    in spring and fall  . Hyperlipidemia   . Hypertension   . Non-obstructive CAD    a. 09/2013 Cath/NSTEMI: nonobs dzs.  . Non-STEMI (non-ST elevated myocardial infarction) ([place])    a. 09/2013 - peak trop 5.38;  b. 09/2013 Cath: nonobs dzs-->Med Rx.  . Pneumonia    "in my 20's"  . PONV (postoperative nausea and vomiting)   . Stroke [facility]) 2009   a. 09/2015 L Thalamic Lacunar infarct, presumed to be 2/2 small vessel dzs, residuals include numbness on right side of lips and right finger tips; b. 09/2015 Echo: nl LV fxn; c. 09/2015 Carotid U/S: mild bilat ICA stenosis.    Past Surgical History:  Procedure Laterality Date  . ABDOMINAL HYSTERECTOMY  1974  . BREAST SURGERY Left 1976   lumpectomy  . EYE SURGERY     cataract surgery, both eyes  . FRACTURE SURGERY Right 1971   thumb and index finger  . LEFT HEART CATHETERIZATION WITH CORONARY ANGIOGRAM N/A 09/29/2013   Procedure: LEFT HEART CATHETERIZATION WITH CORONARY ANGIOGRAM;  Surgeon: Peter M Martinique, MD;  Location: [facility] CATH LAB;  Service: Cardiovascular;   Laterality: N/A;  . PARTIAL KNEE ARTHROPLASTY Right 12/31/2014   Procedure: RIGHT KNEE MEDIAL UNICOMPARTMENTAL ARTHROPLASTY;  Surgeon: Gaynelle Arabian, MD;  Location: [facility]S;  Service: Orthopedics;  Laterality: Right;    There were no vitals filed for this visit.   Subjective Assessment - 12/09/20 2017    Subjective "fine"    Patient is accompained by: Family member   [name], daughter   Currently in Pain? No/denies                 ADULT SLP TREATMENT - 12/09/20 2018      General Information   Behavior/Cognition Alert;Cooperative;Confused;Pleasant mood      Treatment Provided   Treatment provided Cognitive-Linquistic      Cognitive-Linquistic Treatment   Treatment focused on Aphasia;Cognition;Patient/family/caregiver education    Skilled Treatment SLP provided education re: importance of completing HEP at home and participating in social scenarios given recent plateau of progress and decline in patient motivation. SLP targeted generation of functional phrases to use of phone calls with friends to improve communication effectiveness and decrease social isolation, with max A required to formulate sentences. SLP questions impact of cognitive impairment on word finding difficulty due overt recall deficits. Pt's daughter also questioned patient's cognition related to questionable independent implementation of ADL routine  and increased confusion, including locking self outside of home x2. SLP to target external memory aids in upcoming sessions.      Assessment / Recommendations / Plan   Plan Continue with current plan of care      Progression Toward Goals   Progression toward goals Progressing toward goals            SLP Education - 12/09/20 2027    Education Details HEP, functional communication    Person(s) Educated Patient;Child(ren)    Methods Explanation;Demonstration;Handout;Verbal cues    Comprehension Verbalized understanding;Returned demonstration;Verbal cues  required;Need further instruction            SLP Short Term Goals - 11/11/20 1656      SLP SHORT TERM GOAL #1   Title Pt will demonstrate use of word finding compensations in simple 5 minute conversation with min A over 2 sessions    Baseline 11-04-20    Status Partially Met      SLP SHORT TERM GOAL #2   Title Pt's caregiver will appropriately cue patient when word finding episodes occur with rare min A over 2 sessions    Baseline 10-28-20, 10-30-20    Status Achieved      SLP SHORT TERM GOAL #3   Title Pt will generate functional sentences to improve communication of basic wants/needs with min A over 2 sessions    Baseline 11-06-20    Status Partially Met      SLP SHORT TERM GOAL #4   Title Pt will complete formal cognitive linguistic assessment in first 2 sessions    Status Achieved      SLP SHORT TERM GOAL #5   Title Pt will carryover external aids to recall to-do list, appointments and pertinent information with occasional min A over 3 sessions    Status Deferred            SLP Long Term Goals - 12/09/20 2032      SLP LONG TERM GOAL #1   Title Pt will use word finding compensations in 10  simple conversation with occasional min A over 2 sessions    Time 5    Period Weeks   or 29 visits for all LTGs   Status On-going      SLP LONG TERM GOAL #2   Title Pt will generate functional sentences to improve communication of simple wants/needs with min A over 2 sessions    Baseline 11-20-20    Time 5    Period Weeks    Status On-going      SLP LONG TERM GOAL #3   Title Pt will use external aids to recall to-do list, appointments and pertinent information with rare min A over 3 sessions    Time 5    Period Weeks    Status On-going      SLP LONG TERM GOAL #4   Title Pt will use multimodal communication to augment verbal expression of wants/needs with usual min A over 2 sessions    Time 5    Period Weeks    Status On-going      SLP LONG TERM GOAL #5   Title Pt will  undergo objective swallow study to evaluate pharyngeal function and assess for risk of aspiration if ongoing swallow deficits reported    Time 5    Period Weeks    Status On-going            Plan - 12/09/20 2028    Clinical Impression Statement Crystal Day  presents with persisting mild to moderate expressive aphasia and cognitive linguistic deficits. SLP targeted generation of functional sentences for phone conversations with friends to reduce social isolation, with mod to max A required to elicit sentences. Pt continues to utilize empty speech and relies on daughter to communicate basic wants/needs related to personal life. SLP questions overall impact of cognitive deficits on language deficits which is difficult to decipher given aphasia. 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    Speech Therapy Frequency 2x / week    Duration Other (comment)   6 more weeks or 29 total visits   Treatment/Interventions Compensatory strategies;Patient/family education;Functional tasks;Cueing hierarchy;Multimodal communcation approach;Cognitive reorganization;Compensatory techniques;Internal/external aids;SLP instruction and feedback;Aspiration precaution training;Pharyngeal strengthening exercises;Diet toleration management by SLP    Potential to Achieve Goals Fair    Potential Considerations Ability to learn/carryover information;Severity of impairments    SLP Home Exercise Plan provided    Consulted and Agree with Plan of Care Patient;Family member/caregiver           Patient will benefit from skilled therapeutic intervention in order to improve the following deficits and impairments:   Aphasia  Cognitive communication deficit    Problem List Patient Active Problem List   Diagnosis Date Noted  . Aphasia 11/04/2020  . Memory loss  11/04/2020  . Vitamin D deficiency 09/30/2020  . CVA (cerebral vascular accident) ([place]) 04/26/2019  . Non-STEMI (non-ST elevated myocardial infarction) ([place])   . Non-obstructive CAD   . Hypertension   . TIA (transient ischemic attack)   . Lacunar infarct, acute ([place]) 10/03/2015  . CKD (chronic kidney disease) stage 3, GFR 30-59 ml/min ([facility]) 10/03/2015  . Acute stroke due to ischemia ([place])   . HLD (hyperlipidemia)   . OA (osteoarthritis) of knee 12/31/2014  . Syncope 09/28/2014  . Hyperlipidemia 04/17/2014  . Atherosclerosis of native coronary artery of native heart without angina pectoris 10/05/2013  . NSTEMI (non-ST elevated myocardial infarction) ([place]) 09/29/2013  . Chest pain 09/28/2013  . Essential hypertension 09/28/2013  . Acute renal failure ([place]) 09/28/2013    Alinda Deem, MA CCC-SLP 12/09/2020, 8:36 PM  [facility] [address] [place], [place], [address] Phone: [phone]   Fax:  [phone]   Name: Crystal Day MRN: [id number] Date of Birth: 06/20/1935

## 2020-12-11 ENCOUNTER — Ambulatory Visit: Payer: PPO

## 2020-12-11 ENCOUNTER — Other Ambulatory Visit: Payer: Self-pay

## 2020-12-11 DIAGNOSIS — R41841 Cognitive communication deficit: Secondary | ICD-10-CM

## 2020-12-11 DIAGNOSIS — R4701 Aphasia: Secondary | ICD-10-CM

## 2020-12-11 NOTE — Therapy (Signed)
[facility] [address] [place], [place], [address] Phone: [phone]   Fax:  [phone]  Speech Language Pathology Treatment  Patient Details  Name: Crystal Day MRN: [id number] Date of Birth: July 31, 1935 Referring Provider (SLP): Ref: Benjiman Core PA (Neuro: Arlice Colt MD)   Encounter Date: 12/11/2020   End of Session - 12/11/20 1703    Visit Number 19    Number of Visits 29    Date for SLP Re-Evaluation 02/02/21    SLP Start Time 1500    SLP Stop Time  1545    SLP Time Calculation (min) 45 min    Activity Tolerance Patient tolerated treatment well           Past Medical History:  Diagnosis Date  . Anginal pain ([place]) since 2014  . Arthritis    just in the hands  . Cancer ([place]) 1988 or 89   basal cell carcinoma on face s/p Mohs procedure  . Complication of anesthesia    nausea  . History of wheezing    in spring and fall  . Hyperlipidemia   . Hypertension   . Non-obstructive CAD    a. 09/2013 Cath/NSTEMI: nonobs dzs.  . Non-STEMI (non-ST elevated myocardial infarction) ([place])    a. 09/2013 - peak trop 5.38;  b. 09/2013 Cath: nonobs dzs-->Med Rx.  . Pneumonia    "in my 20's"  . PONV (postoperative nausea and vomiting)   . Stroke [facility]) 2009   a. 09/2015 L Thalamic Lacunar infarct, presumed to be 2/2 small vessel dzs, residuals include numbness on right side of lips and right finger tips; b. 09/2015 Echo: nl LV fxn; c. 09/2015 Carotid U/S: mild bilat ICA stenosis.    Past Surgical History:  Procedure Laterality Date  . ABDOMINAL HYSTERECTOMY  1974  . BREAST SURGERY Left 1976   lumpectomy  . EYE SURGERY     cataract surgery, both eyes  . FRACTURE SURGERY Right 1971   thumb and index finger  . LEFT HEART CATHETERIZATION WITH CORONARY ANGIOGRAM N/A 09/29/2013   Procedure: LEFT HEART CATHETERIZATION WITH CORONARY ANGIOGRAM;  Surgeon: Peter M Martinique, MD;  Location: [facility] CATH LAB;  Service: Cardiovascular;   Laterality: N/A;  . PARTIAL KNEE ARTHROPLASTY Right 12/31/2014   Procedure: RIGHT KNEE MEDIAL UNICOMPARTMENTAL ARTHROPLASTY;  Surgeon: Gaynelle Arabian, MD;  Location: [facility] ORS;  Service: Orthopedics;  Laterality: Right;    There were no vitals filed for this visit.   Subjective Assessment - 12/11/20 1710    Subjective "doing good"    Patient is accompained by: Family member   [name]   Currently in Pain? No/denies                 ADULT SLP TREATMENT - 12/11/20 1703      General Information   Behavior/Cognition Alert;Cooperative;Confused;Pleasant mood      Treatment Provided   Treatment provided Cognitive-Linquistic      Cognitive-Linquistic Treatment   Treatment focused on Aphasia;Cognition;Patient/family/caregiver education    Skilled Treatment Success reported with HEP, including rewording illogical sentences and sentence completion with rare min A provided by daughter. Pt is also reading aloud at home as recommended. Automatic speech reported for reherseal of bible verses, which motivated patient. SLP targeted generation of dialogue bubbles given picture stimuli, with usual perseverations noted despite mod A. SLP engaged patient in recall task of daily routine, in which pt able to verbally sequence steps with min increasing to mod prompting. Occasional word finding exhibited  requiring semantic cues.      Assessment / Recommendations / Plan   Plan Continue with current plan of care      Progression Toward Goals   Progression toward goals Progressing toward goals            SLP Education - 12/11/20 1750    Education Details functional practice, automatic sequences, reading aloud    Person(s) Educated Patient;Child(ren)    Methods Explanation;Demonstration;Handout;Verbal cues    Comprehension Verbalized understanding;Returned demonstration;Need further instruction;Verbal cues required            SLP Short Term Goals - 11/11/20 1656      SLP SHORT TERM GOAL #1    Title Pt will demonstrate use of word finding compensations in simple 5 minute conversation with min A over 2 sessions    Baseline 11-04-20    Status Partially Met      SLP SHORT TERM GOAL #2   Title Pt's caregiver will appropriately cue patient when word finding episodes occur with rare min A over 2 sessions    Baseline 10-28-20, 10-30-20    Status Achieved      SLP SHORT TERM GOAL #3   Title Pt will generate functional sentences to improve communication of basic wants/needs with min A over 2 sessions    Baseline 11-06-20    Status Partially Met      SLP SHORT TERM GOAL #4   Title Pt will complete formal cognitive linguistic assessment in first 2 sessions    Status Achieved      SLP SHORT TERM GOAL #5   Title Pt will carryover external aids to recall to-do list, appointments and pertinent information with occasional min A over 3 sessions    Status Deferred            SLP Long Term Goals - 12/11/20 1704      SLP LONG TERM GOAL #1   Title Pt will use word finding compensations in 10  simple conversation with occasional min A over 2 sessions    Time 4    Period Weeks   or 29 visits for all LTGs   Status On-going      SLP LONG TERM GOAL #2   Title Pt will generate functional sentences to improve communication of simple wants/needs with min A over 2 sessions    Baseline 11-20-20    Time 4    Period Weeks    Status On-going      SLP LONG TERM GOAL #3   Title Pt will use external aids to recall to-do list, appointments and pertinent information with rare min A over 3 sessions    Time 4    Period Weeks    Status On-going      SLP LONG TERM GOAL #4   Title Pt will use multimodal communication to augment verbal expression of wants/needs with usual min A over 2 sessions    Time 4    Period Weeks    Status On-going      SLP LONG TERM GOAL #5   Title Pt will undergo objective swallow study to evaluate pharyngeal function and assess for risk of aspiration if ongoing swallow  deficits reported    Time 4    Period Weeks    Status On-going            Plan - 12/11/20 [place] presents with persisting mild to moderate expressive aphasia and cognitive linguistic deficits. SLP  targeted generation of functional sentences given picture stimuli, with usual perseveratoins exhibited. Pt continues to utilize empty speech and relies on daughter to communicate basic wants/needs related to personal life; however, some improvements exhibited today in open-ended questions. SLP questions overall impact of cognitive deficits impact on language deficits which is difficult to decipher given aphasia. 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    Speech Therapy Frequency 2x / week    Duration Other (comment)   6 more weeks or 29 total visits   Treatment/Interventions Compensatory strategies;Patient/family education;Functional tasks;Cueing hierarchy;Multimodal communcation approach;Cognitive reorganization;Compensatory techniques;Internal/external aids;SLP instruction and feedback;Aspiration precaution training;Pharyngeal strengthening exercises;Diet toleration management by SLP    Potential to Achieve Goals Fair    Potential Considerations Ability to learn/carryover information;Severity of impairments    SLP Home Exercise Plan provided    Consulted and Agree with Plan of Care Patient;Family member/caregiver           Patient will benefit from skilled therapeutic intervention in order to improve the following deficits and impairments:   Aphasia  Cognitive communication deficit    Problem List Patient Active Problem List   Diagnosis Date Noted  . Aphasia 11/04/2020  . Memory loss 11/04/2020  . Vitamin D deficiency 09/30/2020  . CVA (cerebral vascular accident) ([place]) 04/26/2019  .  Non-STEMI (non-ST elevated myocardial infarction) ([place])   . Non-obstructive CAD   . Hypertension   . TIA (transient ischemic attack)   . Lacunar infarct, acute ([place]) 10/03/2015  . CKD (chronic kidney disease) stage 3, GFR 30-59 ml/min (H[facility]) 10/03/2015  . Acute stroke due to ischemia ([place])   . HLD (hyperlipidemia)   . OA (osteoarthritis) of knee 12/31/2014  . Syncope 09/28/2014  . Hyperlipidemia 04/17/2014  . Atherosclerosis of native coronary artery of native heart without angina pectoris 10/05/2013  . NSTEMI (non-ST elevated myocardial infarction) ([place]) 09/29/2013  . Chest pain 09/28/2013  . Essential hypertension 09/28/2013  . Acute renal failure ([place]) 09/28/2013    Alinda Deem, MA CCC-SLP 12/11/2020, 5:52 PM  [facility] [address] [place], [place], [address] Phone: [phone]   Fax:  [phone]   Name: Crystal Day MRN: [id number] Date of Birth: 04/26/1935

## 2020-12-16 ENCOUNTER — Other Ambulatory Visit: Payer: Self-pay

## 2020-12-16 ENCOUNTER — Ambulatory Visit: Payer: PPO | Attending: Physician Assistant

## 2020-12-16 DIAGNOSIS — R4701 Aphasia: Secondary | ICD-10-CM | POA: Diagnosis not present

## 2020-12-16 DIAGNOSIS — R1310 Dysphagia, unspecified: Secondary | ICD-10-CM | POA: Insufficient documentation

## 2020-12-16 DIAGNOSIS — R1311 Dysphagia, oral phase: Secondary | ICD-10-CM | POA: Insufficient documentation

## 2020-12-16 DIAGNOSIS — R41841 Cognitive communication deficit: Secondary | ICD-10-CM | POA: Diagnosis not present

## 2020-12-16 NOTE — Therapy (Signed)
[facility] [address] [place], [place], [address] Phone: [phone]   Fax:  [phone]  Speech Language Pathology Treatment- Progress Note  Patient Details  Name: Crystal Day MRN: [id number] Date of Birth: 07-16-1935 Referring Provider (SLP): Ref: Benjiman Core PA (Neuro: Arlice Colt MD)   Encounter Date: 12/16/2020   Speech Therapy Progress Note  Dates of Reporting Period: 10-10-20 to current  Objective Reports of Subjective Statement: Pt has been seen for 20 ST visits to address aphasia and cognitive communication skills, with persistent difficulties exhibited secondary to severity of anomic deficits and recall.   Objective Measurements: Pt continues to present with persistent anomic aphasia impacting overall communication effectiveness. Pt relies on daughter for usual cues to aid anomia. SLP has targeted various word finding techniques to aid communication, with need for carrier phrases, semantic cues, and phonemic sounds to improve word finding. Usual perseverations and empty speech noted on structured tasks and in conversation. Due to communication impairment, cognition has been secondary to communication, but SLP has provided recommendations to address memory deficits. Pt would benefit from continued ST therapy to maximize communication effectiveness and reduce frustration related to word finding.  Goal Update: see goals below   Plan: continue per POC  Reason Skilled Services are Required: to maximize communication effectiveness and optimize functional independence and safety as pt has not yet met max rehab potential   End of Session - 12/16/20 1659    Visit Number 20    Number of Visits 29    Date for SLP Re-Evaluation 02/02/21    SLP Start Time 1700    SLP Stop Time  9935    SLP Time Calculation (min) 45 min    Activity Tolerance Patient tolerated treatment well           Past Medical History:   Diagnosis Date  . Anginal pain ([place]) since 2014  . Arthritis    just in the hands  . Cancer ([place]) 1988 or 89   basal cell carcinoma on face s/p Mohs procedure  . Complication of anesthesia    nausea  . History of wheezing    in spring and fall  . Hyperlipidemia   . Hypertension   . Non-obstructive CAD    a. 09/2013 Cath/NSTEMI: nonobs dzs.  . Non-STEMI (non-ST elevated myocardial infarction) ([place])    a. 09/2013 - peak trop 5.38;  b. 09/2013 Cath: nonobs dzs-->Med Rx.  . Pneumonia    "in my 20's"  . PONV (postoperative nausea and vomiting)   . Stroke [facility]) 2009   a. 09/2015 L Thalamic Lacunar infarct, presumed to be 2/2 small vessel dzs, residuals include numbness on right side of lips and right finger tips; b. 09/2015 Echo: nl LV fxn; c. 09/2015 Carotid U/S: mild bilat ICA stenosis.    Past Surgical History:  Procedure Laterality Date  . ABDOMINAL HYSTERECTOMY  1974  . BREAST SURGERY Left 1976   lumpectomy  . EYE SURGERY     cataract surgery, both eyes  . FRACTURE SURGERY Right 1971   thumb and index finger  . LEFT HEART CATHETERIZATION WITH CORONARY ANGIOGRAM N/A 09/29/2013   Procedure: LEFT HEART CATHETERIZATION WITH CORONARY ANGIOGRAM;  Surgeon: Peter M Martinique, MD;  Location[facility] CATH LAB;  Service: Cardiovascular;  Laterality: N/A;  . PARTIAL KNEE ARTHROPLASTY Right 12/31/2014   Procedure: RIGHT KNEE MEDIAL UNICOMPARTMENTAL ARTHROPLASTY;  Surgeon: Gaynelle Arabian, MD;  Location: [facility] ORS;  Service: Orthopedics;  Laterality: Right;  There were no vitals filed for this visit.   Subjective Assessment - 12/16/20 1702    Subjective "just tired"    Patient is accompained by: Family member   Crystal Day, daughter   Currently in Pain? No/denies                 ADULT SLP TREATMENT - 12/16/20 1659      General Information   Behavior/Cognition Alert;Cooperative;Confused;Pleasant mood      Treatment Provided   Treatment provided Cognitive-Linquistic      Cognitive-Linquistic  Treatment   Treatment focused on Aphasia;Cognition;Patient/family/caregiver education    Skilled Treatment Pt noted with increased anomia this session for sentence level tasks and picture description tasks. Usual first letter and phonemic cues required to describe various details of photos. Occasional perseverations noted. SLP recommended writing daily journal to aid recall of day to day events and improve communication of daily occurances.      Assessment / Recommendations / Plan   Plan Continue with current plan of care      Progression Toward Goals   Progression toward goals Progressing toward goals            SLP Education - 12/16/20 1755    Education Details HEP, impact of fatigue on word finding    Person(s) Educated Patient;Child(ren)    Methods Explanation;Demonstration;Handout;Verbal cues    Comprehension Verbalized understanding;Returned demonstration;Verbal cues required;Need further instruction            SLP Short Term Goals - 11/11/20 1656      SLP SHORT TERM GOAL #1   Title Pt will demonstrate use of word finding compensations in simple 5 minute conversation with min A over 2 sessions    Baseline 11-04-20    Status Partially Met      SLP SHORT TERM GOAL #2   Title Pt's caregiver will appropriately cue patient when word finding episodes occur with rare min A over 2 sessions    Baseline 10-28-20, 10-30-20    Status Achieved      SLP SHORT TERM GOAL #3   Title Pt will generate functional sentences to improve communication of basic wants/needs with min A over 2 sessions    Baseline 11-06-20    Status Partially Met      SLP SHORT TERM GOAL #4   Title Pt will complete formal cognitive linguistic assessment in first 2 sessions    Status Achieved      SLP SHORT TERM GOAL #5   Title Pt will carryover external aids to recall to-do list, appointments and pertinent information with occasional min A over 3 sessions    Status Deferred            SLP Long Term Goals -  12/16/20 1700      SLP LONG TERM GOAL #1   Title Pt will use word finding compensations in 10  simple conversation with occasional min A over 2 sessions    Time 4    Period Weeks   or 29 visits for all LTGs   Status On-going      SLP LONG TERM GOAL #2   Title Pt will generate functional sentences to improve communication of simple wants/needs with min A over 2 sessions    Baseline 11-20-20    Time 4    Period Weeks    Status On-going      SLP LONG TERM GOAL #3   Title Pt will use external aids to recall to-do list, appointments and pertinent  information with rare min A over 3 sessions    Time 4    Period Weeks    Status On-going      SLP LONG TERM GOAL #4   Title Pt will use multimodal communication to augment verbal expression of wants/needs with usual min A over 2 sessions    Time 4    Period Weeks    Status On-going      SLP LONG TERM GOAL #5   Title Pt will undergo objective swallow study to evaluate pharyngeal function and assess for risk of aspiration if ongoing swallow deficits reported    Time 4    Period Weeks    Status On-going            Plan - 12/16/20 1753    Clinical Impression Statement Crystal Day presents with persisting mild to moderate expressive aphasia and cognitive linguistic deficits. SLP targeted generation of functional sentences given initial sentence starts, with usual cues required to provide ending to sentences. SLP targeted picture description task, with usual phonemic, semantic, and first letter cues to provide accurate descriptions given prevalence of empty speech. SLP questions overall impact of cognitive deficits impact on language deficits which is difficult to decipher given aphasia. 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    Speech Therapy Frequency 2x / week    Duration Other  (comment)   6 more weeks or 29 total visits   Treatment/Interventions Compensatory strategies;Patient/family education;Functional tasks;Cueing hierarchy;Multimodal communcation approach;Cognitive reorganization;Compensatory techniques;Internal/external aids;SLP instruction and feedback;Aspiration precaution training;Pharyngeal strengthening exercises;Diet toleration management by SLP    Potential to Achieve Goals Fair    Potential Considerations Ability to learn/carryover information;Severity of impairments    SLP Home Exercise Plan provided    Consulted and Agree with Plan of Care Patient;Family member/caregiver    Family Member Consulted Crystal Day           Patient will benefit from skilled therapeutic intervention in order to improve the following deficits and impairments:   Aphasia  Cognitive communication deficit    Problem List Patient Active Problem List   Diagnosis Date Noted  . Aphasia 11/04/2020  . Memory loss 11/04/2020  . Vitamin D deficiency 09/30/2020  . CVA (cerebral vascular accident) ([place]) 04/26/2019  . Non-STEMI (non-ST elevated myocardial infarction) ([place])   . Non-obstructive CAD   . Hypertension   . TIA (transient ischemic attack)   . Lacunar infarct, acute ([place]) 10/03/2015  . CKD (chronic kidney disease) stage 3, GFR 30-59 ml/min (HCC) 10/03/2015  . Acute stroke due to ischemia ([place])   . HLD (hyperlipidemia)   . OA (osteoarthritis) of knee 12/31/2014  . Syncope 09/28/2014  . Hyperlipidemia 04/17/2014  . Atherosclerosis of native coronary artery of native heart without angina pectoris 10/05/2013  . NSTEMI (non-ST elevated myocardial infarction) ([place]) 09/29/2013  . Chest pain 09/28/2013  . Essential hypertension 09/28/2013  . Acute renal failure ([place]) 09/28/2013    Alinda Deem, MA CCC-SLP 12/16/2020, 5:56 PM  [facility] [address] [place], [place], [address] Phone: [phone]   Fax:   [phone]   Name: Crystal Day MRN: [id number] Date of Birth: 1934-10-21

## 2020-12-18 ENCOUNTER — Other Ambulatory Visit: Payer: Self-pay

## 2020-12-18 ENCOUNTER — Ambulatory Visit: Payer: PPO

## 2020-12-18 DIAGNOSIS — R4701 Aphasia: Secondary | ICD-10-CM

## 2020-12-18 DIAGNOSIS — R41841 Cognitive communication deficit: Secondary | ICD-10-CM

## 2020-12-18 DIAGNOSIS — R1310 Dysphagia, unspecified: Secondary | ICD-10-CM

## 2020-12-18 NOTE — Therapy (Signed)
[facility] [address] [place], [place], [address] Phone: [phone]   Fax:  [phone]  Speech Language Pathology Treatment  Patient Details  Name: Crystal Day MRN: [id number] Date of Birth: Nov 07, 1934 Referring Provider (SLP): Ref: Benjiman Core PA (Neuro: Arlice Colt MD)   Encounter Date: 12/18/2020   End of Session - 12/18/20 1744    Visit Number 21    Number of Visits 29    Date for SLP Re-Evaluation 02/02/21    SLP Start Time 1745    SLP Stop Time  6389    SLP Time Calculation (min) 45 min    Activity Tolerance Patient tolerated treatment well           Past Medical History:  Diagnosis Date  . Anginal pain ([place]) since 2014  . Arthritis    just in the hands  . Cancer ([place]) 1988 or 89   basal cell carcinoma on face s/p Mohs procedure  . Complication of anesthesia    nausea  . History of wheezing    in spring and fall  . Hyperlipidemia   . Hypertension   . Non-obstructive CAD    a. 09/2013 Cath/NSTEMI: nonobs dzs.  . Non-STEMI (non-ST elevated myocardial infarction) ([place])    a. 09/2013 - peak trop 5.38;  b. 09/2013 Cath: nonobs dzs-->Med Rx.  . Pneumonia    "in my 20's"  . PONV (postoperative nausea and vomiting)   . Stroke [facility]) 2009   a. 09/2015 L Thalamic Lacunar infarct, presumed to be 2/2 small vessel dzs, residuals include numbness on right side of lips and right finger tips; b. 09/2015 Echo: nl LV fxn; c. 09/2015 Carotid U/S: mild bilat ICA stenosis.    Past Surgical History:  Procedure Laterality Date  . ABDOMINAL HYSTERECTOMY  1974  . BREAST SURGERY Left 1976   lumpectomy  . EYE SURGERY     cataract surgery, both eyes  . FRACTURE SURGERY Right 1971   thumb and index finger  . LEFT HEART CATHETERIZATION WITH CORONARY ANGIOGRAM N/A 09/29/2013   Procedure: LEFT HEART CATHETERIZATION WITH CORONARY ANGIOGRAM;  Surgeon: Peter M Martinique, MD;  Location: [facility] CATH LAB;  Service: Cardiovascular;   Laterality: N/A;  . PARTIAL KNEE ARTHROPLASTY Right 12/31/2014   Procedure: RIGHT KNEE MEDIAL UNICOMPARTMENTAL ARTHROPLASTY;  Surgeon: Gaynelle Arabian, MD;  Location: [facility] ORS;  Service: Orthopedics;  Laterality: Right;    There were no vitals filed for this visit.   Subjective Assessment - 12/18/20 1745    Subjective "tired"    Patient is accompained by: Family member   [name]                ADULT SLP TREATMENT - 12/18/20 1743      General Information   Behavior/Cognition Alert;Cooperative;Confused;Pleasant mood      Treatment Provided   Treatment provided Cognitive-Linquistic;Dysphagia      Dysphagia Treatment   Treatment Methods Patient/caregiver education    Other treatment/comments Pt and daughter c/o occasional swallowing difficulties, including coughing and clearing throat during and after meals. Pt's daughter inquired about MBSS, in which SLP ordered MBSS this session due to reported swallowing concern.      Cognitive-Linquistic Treatment   Treatment focused on Aphasia;Cognition;Patient/family/caregiver education    Skilled Treatment Pt noted with increased anomia this session, with possible regression noted. Pt's daughter reports no HEP completed and overall reduced motivation. SLP re-educated patient and daughter on impact of motivation and engagement on outcomes. Pt benefits from usual  carrier phrases, phonemic cues, and additional processing time for simple convergent naming tasks. Pt required mod A to name family members this session.      Assessment / Recommendations / Plan   Plan Continue with current plan of care      Progression Toward Goals   Progression toward goals Not progressing toward goals (comment)   limited carryover           SLP Education - 12/18/20 1743    Education Details MBSS ordered, functional naming of family with pictures    Person(s) Educated Patient    Methods Explanation;Demonstration;Handout    Comprehension Verbalized  understanding;Returned demonstration;Need further instruction            SLP Short Term Goals - 11/11/20 1656      SLP SHORT TERM GOAL #1   Title Pt will demonstrate use of word finding compensations in simple 5 minute conversation with min A over 2 sessions    Baseline 11-04-20    Status Partially Met      SLP SHORT TERM GOAL #2   Title Pt's caregiver will appropriately cue patient when word finding episodes occur with rare min A over 2 sessions    Baseline 10-28-20, 10-30-20    Status Achieved      SLP SHORT TERM GOAL #3   Title Pt will generate functional sentences to improve communication of basic wants/needs with min A over 2 sessions    Baseline 11-06-20    Status Partially Met      SLP SHORT TERM GOAL #4   Title Pt will complete formal cognitive linguistic assessment in first 2 sessions    Status Achieved      SLP SHORT TERM GOAL #5   Title Pt will carryover external aids to recall to-do list, appointments and pertinent information with occasional min A over 3 sessions    Status Deferred            SLP Long Term Goals - 12/18/20 1745      SLP LONG TERM GOAL #1   Title Pt will use word finding compensations in 10  simple conversation with occasional min A over 2 sessions    Time 4    Period Weeks   or 29 visits for all LTGs   Status On-going      SLP LONG TERM GOAL #2   Title Pt will generate functional sentences to improve communication of simple wants/needs with min A over 2 sessions    Baseline 11-20-20    Time 4    Period Weeks    Status On-going      SLP LONG TERM GOAL #3   Title Pt will use external aids to recall to-do list, appointments and pertinent information with rare min A over 3 sessions    Time 4    Period Weeks    Status On-going      SLP LONG TERM GOAL #4   Title Pt will use multimodal communication to augment verbal expression of wants/needs with usual min A over 2 sessions    Time 4    Period Weeks    Status On-going      SLP LONG TERM  GOAL #5   Title Pt will undergo objective swallow study to evaluate pharyngeal function and assess for risk of aspiration if ongoing swallow deficits reported    Time 4    Period Weeks    Status On-going  Plan - 12/18/20 1838    Clinical Impression Statement Crystal Day presents with persisting mild to moderate expressive aphasia and cognitive linguistic deficits. SLP targeted simpel functional naming due increased anomia exhibited this session. Usual phonemic, semantic, and first letter cues required to name items in room, family member names, and finish sentences. SLP questions overall impact of cognitive deficits impact on language deficits which is difficult to decipher given aphasia. MBSS requested this session as pt and family c/o occasional yet persistent swallowing difficulties. 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    Speech Therapy Frequency 2x / week    Duration Other (comment)   6 more weeks or 29 total visits   Treatment/Interventions Compensatory strategies;Patient/family education;Functional tasks;Cueing hierarchy;Multimodal communcation approach;Cognitive reorganization;Compensatory techniques;Internal/external aids;SLP instruction and feedback;Aspiration precaution training;Pharyngeal strengthening exercises;Diet toleration management by SLP    Potential to Achieve Goals Fair    Potential Considerations Ability to learn/carryover information;Severity of impairments    SLP Home Exercise Plan provided    Consulted and Agree with Plan of Care Patient;Family member/caregiver    Family Member Consulted [name]           Patient will benefit from skilled therapeutic intervention in order to improve the following deficits and impairments:   Aphasia  Cognitive communication deficit  Dysphagia, unspecified  type    Problem List Patient Active Problem List   Diagnosis Date Noted  . Aphasia 11/04/2020  . Memory loss 11/04/2020  . Vitamin D deficiency 09/30/2020  . CVA (cerebral vascular accident) ([place]) 04/26/2019  . Non-STEMI (non-ST elevated myocardial infarction) ([place])   . Non-obstructive CAD   . Hypertension   . TIA (transient ischemic attack)   . Lacunar infarct, acute ([place]) 10/03/2015  . CKD (chronic kidney disease) stage 3, GFR 30-59 ml/min ([facility]) 10/03/2015  . Acute stroke due to ischemia ([place])   . HLD (hyperlipidemia)   . OA (osteoarthritis) of knee 12/31/2014  . Syncope 09/28/2014  . Hyperlipidemia 04/17/2014  . Atherosclerosis of native coronary artery of native heart without angina pectoris 10/05/2013  . NSTEMI (non-ST elevated myocardial infarction) ([place]) 09/29/2013  . Chest pain 09/28/2013  . Essential hypertension 09/28/2013  . Acute renal failure ([place]) 09/28/2013    Alinda Deem, MA CCC-SLP 12/18/2020, 6:44 PM  [facility] [address] [place], [place], [address] Phone: [phone]   Fax:  [phone]   Name: Crystal Day MRN: [id number] Date of Birth: 12-28-1934

## 2020-12-19 ENCOUNTER — Other Ambulatory Visit (HOSPITAL_COMMUNITY): Payer: Self-pay | Admitting: *Deleted

## 2020-12-19 DIAGNOSIS — R1310 Dysphagia, unspecified: Secondary | ICD-10-CM

## 2020-12-20 DIAGNOSIS — I4891 Unspecified atrial fibrillation: Secondary | ICD-10-CM | POA: Diagnosis not present

## 2020-12-20 DIAGNOSIS — I63311 Cerebral infarction due to thrombosis of right middle cerebral artery: Secondary | ICD-10-CM | POA: Diagnosis not present

## 2020-12-20 DIAGNOSIS — I639 Cerebral infarction, unspecified: Secondary | ICD-10-CM | POA: Diagnosis not present

## 2020-12-25 ENCOUNTER — Other Ambulatory Visit: Payer: Self-pay

## 2020-12-25 ENCOUNTER — Ambulatory Visit: Payer: PPO

## 2020-12-25 DIAGNOSIS — R4701 Aphasia: Secondary | ICD-10-CM | POA: Diagnosis not present

## 2020-12-25 DIAGNOSIS — R41841 Cognitive communication deficit: Secondary | ICD-10-CM

## 2020-12-25 NOTE — Therapy (Signed)
[facility] [address] [place], [place], [address] Phone: [phone]   Fax:  [phone]  Speech Language Pathology Treatment  Patient Details  Name: Crystal Day MRN: [id number] Date of Birth: [birth year] Referring Provider (SLP): Ref: Benjiman Core PA (Neuro: Arlice Colt MD)   Encounter Date: 12/25/2020   End of Session - 12/25/20 1710    Visit Number 22    Number of Visits 29    Date for SLP Re-Evaluation 02/02/21    SLP Start Time 1700    SLP Stop Time  7116    SLP Time Calculation (min) 45 min    Activity Tolerance Patient tolerated treatment well           Past Medical History:  Diagnosis Date  . Anginal pain ([place]) since 2014  . Arthritis    just in the hands  . Cancer ([place]) 1988 or 89   basal cell carcinoma on face s/p Mohs procedure  . Complication of anesthesia    nausea  . History of wheezing    in spring and fall  . Hyperlipidemia   . Hypertension   . Non-obstructive CAD    a. 09/2013 Cath/NSTEMI: nonobs dzs.  . Non-STEMI (non-ST elevated myocardial infarction) ([place])    a. 09/2013 - peak trop 5.38;  b. 09/2013 Cath: nonobs dzs-->Med Rx.  . Pneumonia    "in my 20's"  . PONV (postoperative nausea and vomiting)   . Stroke [facility]) 2009   a. 09/2015 L Thalamic Lacunar infarct, presumed to be 2/2 small vessel dzs, residuals include numbness on right side of lips and right finger tips; b. 09/2015 Echo: nl LV fxn; c. 09/2015 Carotid U/S: mild bilat ICA stenosis.    Past Surgical History:  Procedure Laterality Date  . ABDOMINAL HYSTERECTOMY  1974  . BREAST SURGERY Left 1976   lumpectomy  . EYE SURGERY     cataract surgery, both eyes  . FRACTURE SURGERY Right 1971   thumb and index finger  . LEFT HEART CATHETERIZATION WITH CORONARY ANGIOGRAM N/A 09/29/2013   Procedure: LEFT HEART CATHETERIZATION WITH CORONARY ANGIOGRAM;  Surgeon: Peter M Martinique, MD;  Location: [facility] CATH LAB;  Service: Cardiovascular;   Laterality: N/A;  . PARTIAL KNEE ARTHROPLASTY Right 12/31/2014   Procedure: RIGHT KNEE MEDIAL UNICOMPARTMENTAL ARTHROPLASTY;  Surgeon: Gaynelle Arabian, MD;  Location: [facility];  Service: Orthopedics;  Laterality: Right;    There were no vitals filed for this visit.   Subjective Assessment - 12/25/20 1751    Subjective "I'm okay"    Patient is accompained by: Family member   Crystal Day, daughter   Currently in Pain? No/denies                 ADULT SLP TREATMENT - 12/25/20 1706      General Information   Behavior/Cognition Alert;Cooperative;Confused;Pleasant mood      Treatment Provided   Treatment provided Cognitive-Linquistic      Cognitive-Linquistic Treatment   Treatment focused on Aphasia;Cognition;Patient/family/caregiver education    Skilled Treatment SLP reviewed HEP for fill in blank phrases and naming based on description. Daughter reported min A provided with overall improvements for anomia exhibited. SLP trialed naming with pictures, which pt completed with 94% accuracy. Pt noted with self-corrections x3 given additional processing time. SLP targeted picture descriptions, in which pt able to generate sentences with occasional min A for specifiy details and syntax. Education and positive reinforcement provided this session due to fluctuations in motivation and carryover at home  reported.      Assessment / Recommendations / Plan   Plan Continue with current plan of care      Progression Toward Goals   Progression toward goals Not progressing toward goals (comment)   limited carryover of HEP           SLP Education - 12/25/20 1750    Education Details functional practice, positive reinforcement, naming activities    Person(s) Educated Patient;Child(ren)    Methods Explanation;Demonstration;Handout;Verbal cues    Comprehension Verbalized understanding;Returned demonstration;Verbal cues required;Need further instruction            SLP Short Term Goals - 11/11/20 1656       SLP SHORT TERM GOAL #1   Title Pt will demonstrate use of word finding compensations in simple 5 minute conversation with min A over 2 sessions    Baseline 11-04-20    Status Partially Met      SLP SHORT TERM GOAL #2   Title Pt's caregiver will appropriately cue patient when word finding episodes occur with rare min A over 2 sessions    Baseline 10-28-20, 10-30-20    Status Achieved      SLP SHORT TERM GOAL #3   Title Pt will generate functional sentences to improve communication of basic wants/needs with min A over 2 sessions    Baseline 11-06-20    Status Partially Met      SLP SHORT TERM GOAL #4   Title Pt will complete formal cognitive linguistic assessment in first 2 sessions    Status Achieved      SLP SHORT TERM GOAL #5   Title Pt will carryover external aids to recall to-do list, appointments and pertinent information with occasional min A over 3 sessions    Status Deferred            SLP Long Term Goals - 12/25/20 1751      SLP LONG TERM GOAL #1   Title Pt will use word finding compensations in 10  simple conversation with occasional min A over 2 sessions    Time 3    Period Weeks   or 29 visits for all LTGs   Status On-going      SLP LONG TERM GOAL #2   Title Pt will generate functional sentences to improve communication of simple wants/needs with min A over 2 sessions    Baseline 11-20-20    Time 3    Period Weeks    Status On-going      SLP LONG TERM GOAL #3   Title Pt will use external aids to recall to-do list, appointments and pertinent information with rare min A over 3 sessions    Time 3    Period Weeks    Status On-going      SLP LONG TERM GOAL #4   Title Pt will use multimodal communication to augment verbal expression of wants/needs with usual min A over 2 sessions    Time 3    Period Weeks    Status On-going      SLP LONG TERM GOAL #5   Title Pt will undergo objective swallow study to evaluate pharyngeal function and assess for risk of  aspiration if ongoing swallow deficits reported    Time 3    Period Weeks    Status On-going            Plan - 12/25/20 1752    Clinical Impression Statement Crystal Day presents with persisting mild to moderate expressive aphasia  and cognitive linguistic deficits, and possibly dysphagia (MBSS to be completed this week). SLP targeted simple functional naming due increased recent anomic episodes. Pt named items in pictures with 94% accuracy and generated sentences for picture description with occasional min A to name specific details and correct syntax. SLP provided usual education and positive reinforcement of performance due to reported reduced motivation and carryover at home. SLP questions overall impact of cognitive deficits impact on language deficits which is difficult to decipher given aphasia. 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    Speech Therapy Frequency 2x / week    Duration Other (comment)   6 more weeks or 29 total visits   Treatment/Interventions Compensatory strategies;Patient/family education;Functional tasks;Cueing hierarchy;Multimodal communcation approach;Cognitive reorganization;Compensatory techniques;Internal/external aids;SLP instruction and feedback;Aspiration precaution training;Pharyngeal strengthening exercises;Diet toleration management by SLP    Potential to Achieve Goals Fair    Potential Considerations Ability to learn/carryover information;Severity of impairments    SLP Home Exercise Plan provided    Consulted and Agree with Plan of Care Patient;Family member/caregiver    Family Member Consulted Crystal Day           Patient will benefit from skilled therapeutic intervention in order to improve the following deficits and impairments:   Aphasia  Cognitive communication deficit    Problem  List Patient Active Problem List   Diagnosis Date Noted  . Aphasia 11/04/2020  . Memory loss 11/04/2020  . Vitamin D deficiency 09/30/2020  . CVA (cerebral vascular accident) ([place]) 04/26/2019  . Non-STEMI (non-ST elevated myocardial infarction) ([place])   . Non-obstructive CAD   . Hypertension   . TIA (transient ischemic attack)   . Lacunar infarct, acute ([place]) 10/03/2015  . CKD (chronic kidney disease) stage 3, GFR 30-59 ml/min ([facility]) 10/03/2015  . Acute stroke due to ischemia ([place])   . HLD (hyperlipidemia)   . OA (osteoarthritis) of knee 12/31/2014  . Syncope 09/28/2014  . Hyperlipidemia 04/17/2014  . Atherosclerosis of native coronary artery of native heart without angina pectoris 10/05/2013  . NSTEMI (non-ST elevated myocardial infarction) ([place]) 09/29/2013  . Chest pain 09/28/2013  . Essential hypertension 09/28/2013  . Acute renal failure ([place]) 09/28/2013    Alinda Deem, MA CCC-SLP 12/25/2020, 5:57 PM  [facility] [address] [place], [place], [address] Phone: [phone]   Fax:  [phone]   Name: Crystal Day MRN: [id number] Date of Birth: 05/29/1935

## 2020-12-27 ENCOUNTER — Ambulatory Visit: Payer: PPO

## 2020-12-27 ENCOUNTER — Ambulatory Visit (HOSPITAL_COMMUNITY)
Admission: RE | Admit: 2020-12-27 | Discharge: 2020-12-27 | Disposition: A | Discharge status: Home / Self Care | Payer: PPO | Source: Ambulatory Visit | Attending: Physician Assistant | Admitting: Physician Assistant

## 2020-12-27 ENCOUNTER — Other Ambulatory Visit: Payer: Self-pay

## 2020-12-27 DIAGNOSIS — R1310 Dysphagia, unspecified: Secondary | ICD-10-CM | POA: Insufficient documentation

## 2020-12-27 DIAGNOSIS — R4701 Aphasia: Secondary | ICD-10-CM

## 2020-12-27 DIAGNOSIS — R41841 Cognitive communication deficit: Secondary | ICD-10-CM

## 2020-12-27 DIAGNOSIS — Z0389 Encounter for observation for other suspected diseases and conditions ruled out: Secondary | ICD-10-CM | POA: Diagnosis not present

## 2020-12-27 NOTE — Therapy (Signed)
[facility] [address] [place], [place], [address] Phone: [phone]   Fax:  [phone]  Speech Language Pathology Treatment  Patient Details  Name: Crystal Day MRN: [id number] Date of Birth: [birth year] Referring Provider (SLP): Ref: Benjiman Core PA (Neuro: Arlice Colt MD)   Encounter Date: 12/27/2020   End of Session - 12/27/20 1131    Visit Number 23    Number of Visits 29    Date for SLP Re-Evaluation 02/02/21    SLP Start Time 9163    SLP Stop Time  1100    SLP Time Calculation (min) 45 min    Activity Tolerance Patient tolerated treatment well           Past Medical History:  Diagnosis Date  . Anginal pain ([place]) since 2014  . Arthritis    just in the hands  . Cancer ([place]) 1988 or 89   basal cell carcinoma on face s/p Mohs procedure  . Complication of anesthesia    nausea  . History of wheezing    in spring and fall  . Hyperlipidemia   . Hypertension   . Non-obstructive CAD    a. 09/2013 Cath/NSTEMI: nonobs dzs.  . Non-STEMI (non-ST elevated myocardial infarction) ([place])    a. 09/2013 - peak trop 5.38;  b. 09/2013 Cath: nonobs dzs-->Med Rx.  . Pneumonia    "in my 20's"  . PONV (postoperative nausea and vomiting)   . Stroke [facility]) 2009   a. 09/2015 L Thalamic Lacunar infarct, presumed to be 2/2 small vessel dzs, residuals include numbness on right side of lips and right finger tips; b. 09/2015 Echo: nl LV fxn; c. 09/2015 Carotid U/S: mild bilat ICA stenosis.    Past Surgical History:  Procedure Laterality Date  . ABDOMINAL HYSTERECTOMY  1974  . BREAST SURGERY Left 1976   lumpectomy  . EYE SURGERY     cataract surgery, both eyes  . FRACTURE SURGERY Right 1971   thumb and index finger  . LEFT HEART CATHETERIZATION WITH CORONARY ANGIOGRAM N/A 09/29/2013   Procedure: LEFT HEART CATHETERIZATION WITH CORONARY ANGIOGRAM;  Surgeon: Peter M Martinique, MD;  Location: [facility] CATH LAB;  Service: Cardiovascular;   Laterality: N/A;  . PARTIAL KNEE ARTHROPLASTY Right 12/31/2014   Procedure: RIGHT KNEE MEDIAL UNICOMPARTMENTAL ARTHROPLASTY;  Surgeon: Gaynelle Arabian, MD;  Location: [facility] ORS;  Service: Orthopedics;  Laterality: Right;    There were no vitals filed for this visit.   Subjective Assessment - 12/27/20 1016    Subjective "alright I reckon"    Patient is accompained by: Family member    Currently in Pain? No/denies                 ADULT SLP TREATMENT - 12/27/20 1019      General Information   Behavior/Cognition Alert;Cooperative;Confused;Pleasant mood      Treatment Provided   Treatment provided Cognitive-Linquistic      Cognitive-Linquistic Treatment   Treatment focused on Aphasia;Cognition;Patient/family/caregiver education    Skilled Treatment SLP reviewed HEP for naming given description, in which pt's daughter reports she provided intermittent min to mod A due to phonemic and semantic paraphasias. SLP targeted open ended questions, in which pt able to verbalize appropriate responses with occasional min A due to perseverations. Perseverations improved with written cues. SLP discussed upcoming MBSS, in which pt able to verbalize complaints with occasional min A and use of gestures. SLP recommended use of written visual aids to cue patient to complete ST  HEP, write down foods eaten, and perform proper hygiene. SLP instructed placement and training required to implement.      Assessment / Recommendations / Plan   Plan Continue with current plan of care      Progression Toward Goals   Progression toward goals Progressing toward goals            SLP Education - 12/27/20 1126    Education Details functional communication, to-do lists for recall, written communication to monitor foods eaten    Person(s) Educated Patient;Child(ren)    Methods Explanation;Demonstration;Handout;Verbal cues    Comprehension Verbalized understanding;Returned demonstration;Need further instruction             SLP Short Term Goals - 11/11/20 1656      SLP SHORT TERM GOAL #1   Title Pt will demonstrate use of word finding compensations in simple 5 minute conversation with min A over 2 sessions    Baseline 11-04-20    Status Partially Met      SLP SHORT TERM GOAL #2   Title Pt's caregiver will appropriately cue patient when word finding episodes occur with rare min A over 2 sessions    Baseline 10-28-20, 10-30-20    Status Achieved      SLP SHORT TERM GOAL #3   Title Pt will generate functional sentences to improve communication of basic wants/needs with min A over 2 sessions    Baseline 11-06-20    Status Partially Met      SLP SHORT TERM GOAL #4   Title Pt will complete formal cognitive linguistic assessment in first 2 sessions    Status Achieved      SLP SHORT TERM GOAL #5   Title Pt will carryover external aids to recall to-do list, appointments and pertinent information with occasional min A over 3 sessions    Status Deferred            SLP Long Term Goals - 12/27/20 1131      SLP LONG TERM GOAL #1   Title Pt will use word finding compensations in 10 simple conversation with occasional min A over 2 sessions    Baseline 12-27-20    Time 3    Period Weeks   or 29 visits for all LTGs   Status On-going      SLP LONG TERM GOAL #2   Title Pt will generate functional sentences to improve communication of simple wants/needs with min A over 2 sessions    Baseline 11-20-20    Time 3    Period Weeks    Status On-going      SLP LONG TERM GOAL #3   Title Pt will use external aids to recall to-do list, appointments and pertinent information with rare min A over 3 sessions    Time 3    Period Weeks    Status On-going      SLP LONG TERM GOAL #4   Title Pt will use multimodal communication to augment verbal expression of wants/needs with usual min A over 2 sessions    Baseline 12-27-20    Time 3    Period Weeks    Status On-going      SLP LONG TERM GOAL #5   Title Pt will  undergo objective swallow study to evaluate pharyngeal function and assess for risk of aspiration if ongoing swallow deficits reported    Time 3    Period Weeks    Status On-going  Plan - 12/27/20 1132    Clinical Impression Statement Crystal Day presents with persisting mild to moderate expressive aphasia and cognitive linguistic deficits, and possibly dysphagia (MBSS to be completed today). SLP targeted simple functional of biographical information and in conversation, with rare to occasional min A required due to perseverations and paraphasias. SLP suggested use of external written aids to aid recall and completion of tasks at home, including completing HEP, writing down eaten items due to recall/naming deficits, and proper hygiene. SLP questions overall impact of cognitive deficits on language deficits which is difficult to decipher given aphasia. 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    Speech Therapy Frequency 2x / week    Duration Other (comment)   6 more weeks or 29 total visits   Treatment/Interventions Compensatory strategies;Patient/family education;Functional tasks;Cueing hierarchy;Multimodal communcation approach;Cognitive reorganization;Compensatory techniques;Internal/external aids;SLP instruction and feedback;Aspiration precaution training;Pharyngeal strengthening exercises;Diet toleration management by SLP    Potential to Achieve Goals Fair    Potential Considerations Ability to learn/carryover information;Severity of impairments;Cooperation/participation level    SLP Home Exercise Plan provided    Consulted and Agree with Plan of Care Patient;Family member/caregiver    Family Member Consulted [name]           Patient will benefit from skilled therapeutic intervention in order to improve the following  deficits and impairments:   Aphasia  Cognitive communication deficit    Problem List Patient Active Problem List   Diagnosis Date Noted  . Aphasia 11/04/2020  . Memory loss 11/04/2020  . Vitamin D deficiency 09/30/2020  . CVA (cerebral vascular accident) ([place]) 04/26/2019  . Non-STEMI (non-ST elevated myocardial infarction) ([place])   . Non-obstructive CAD   . Hypertension   . TIA (transient ischemic attack)   . Lacunar infarct, acute ([place]) 10/03/2015  . CKD (chronic kidney disease) stage 3, GFR 30-59 ml/min ([facility]) 10/03/2015  . Acute stroke due to ischemia ([place])   . HLD (hyperlipidemia)   . OA (osteoarthritis) of knee 12/31/2014  . Syncope 09/28/2014  . Hyperlipidemia 04/17/2014  . Atherosclerosis of native coronary artery of native heart without angina pectoris 10/05/2013  . NSTEMI (non-ST elevated myocardial infarction) ([place]) 09/29/2013  . Chest pain 09/28/2013  . Essential hypertension 09/28/2013  . Acute renal failure ([place]) 09/28/2013    Alinda Deem, MA CCC-SLP 12/27/2020, 11:36 AM  [facility] [address] [place], [place], [address] Phone: [phone]   Fax:  [phone]   Name: Crystal Day MRN: [id number] Date of Birth: Nov 17, 1934

## 2020-12-30 ENCOUNTER — Other Ambulatory Visit: Payer: Self-pay

## 2020-12-30 ENCOUNTER — Ambulatory Visit: Payer: PPO

## 2020-12-30 DIAGNOSIS — R4701 Aphasia: Secondary | ICD-10-CM

## 2020-12-30 DIAGNOSIS — R41841 Cognitive communication deficit: Secondary | ICD-10-CM

## 2020-12-30 NOTE — Therapy (Signed)
[facility] [address] [place], [place], [address] Phone: [phone]   Fax:  [phone]  Speech Language Pathology Treatment  Patient Details  Name: Crystal Day MRN: [id number] Date of Birth: 1934/11/29 Referring Provider (SLP): Ref: Benjiman Core PA (Neuro: Arlice Colt MD)   Encounter Date: 12/30/2020   End of Session - 12/30/20 1747    Visit Number 24    Number of Visits 29    Date for SLP Re-Evaluation 02/02/21    SLP Start Time 61    SLP Stop Time  0630    SLP Time Calculation (min) 45 min    Activity Tolerance Patient tolerated treatment well           Past Medical History:  Diagnosis Date  . Anginal pain ([place]) since 2014  . Arthritis    just in the hands  . Cancer ([place]) 1988 or 89   basal cell carcinoma on face s/p Mohs procedure  . Complication of anesthesia    nausea  . History of wheezing    in spring and fall  . Hyperlipidemia   . Hypertension   . Non-obstructive CAD    a. 09/2013 Cath/NSTEMI: nonobs dzs.  . Non-STEMI (non-ST elevated myocardial infarction) ([place])    a. 09/2013 - peak trop 5.38;  b. 09/2013 Cath: nonobs dzs-->Med Rx.  . Pneumonia    "in my 20's"  . PONV (postoperative nausea and vomiting)   . Stroke [facility]) 2009   a. 09/2015 L Thalamic Lacunar infarct, presumed to be 2/2 small vessel dzs, residuals include numbness on right side of lips and right finger tips; b. 09/2015 Echo: nl LV fxn; c. 09/2015 Carotid U/S: mild bilat ICA stenosis.    Past Surgical History:  Procedure Laterality Date  . ABDOMINAL HYSTERECTOMY  1974  . BREAST SURGERY Left 1976   lumpectomy  . EYE SURGERY     cataract surgery, both eyes  . FRACTURE SURGERY Right 1971   thumb and index finger  . LEFT HEART CATHETERIZATION WITH CORONARY ANGIOGRAM N/A 09/29/2013   Procedure: LEFT HEART CATHETERIZATION WITH CORONARY ANGIOGRAM;  Surgeon: Peter M Martinique, MD;  Location: [facility] CATH LAB;  Service: Cardiovascular;   Laterality: N/A;  . PARTIAL KNEE ARTHROPLASTY Right 12/31/2014   Procedure: RIGHT KNEE MEDIAL UNICOMPARTMENTAL ARTHROPLASTY;  Surgeon: Gaynelle Arabian, MD;  [facility];  Service: Orthopedics;  Laterality: Right;    There were no vitals filed for this visit.   Subjective Assessment - 12/30/20 1718    Subjective "I'm okay"    Currently in Pain? No/denies                 ADULT SLP TREATMENT - 12/30/20 1748      General Information   Behavior/Cognition Alert;Cooperative;Confused;Pleasant mood      Treatment Provided   Treatment provided Cognitive-Linquistic      Cognitive-Linquistic Treatment   Treatment focused on Aphasia;Cognition;Patient/family/caregiver education    Skilled Treatment Pt continues to report decreased motivation re: completing HEP and recommended expressive langauge opportunities. SLP reviewed MBSS, which was WNL. SLP engaged patient in simple conversation, in which pt able to particiate with occasional min A for dysnomia. Pt named items to make pie with occasional min A with use of writing. Pt did not use written external memory aid as directed last session.      Assessment / Recommendations / Plan   Plan Continue with current plan of care      Progression Toward Goals  Progression toward goals Not progressing toward goals (comment)   limited carryover           SLP Education - 12/30/20 1746    Education Details HEP, importance of carryover    Person(s) Educated Patient;Child(ren)    Methods Explanation;Demonstration;Handout    Comprehension Verbalized understanding;Returned demonstration;Need further instruction            SLP Short Term Goals - 11/11/20 1656      SLP SHORT TERM GOAL #1   Title Pt will demonstrate use of word finding compensations in simple 5 minute conversation with min A over 2 sessions    Baseline 11-04-20    Status Partially Met      SLP SHORT TERM GOAL #2   Title Pt's caregiver will appropriately cue patient when  word finding episodes occur with rare min A over 2 sessions    Baseline 10-28-20, 10-30-20    Status Achieved      SLP SHORT TERM GOAL #3   Title Pt will generate functional sentences to improve communication of basic wants/needs with min A over 2 sessions    Baseline 11-06-20    Status Partially Met      SLP SHORT TERM GOAL #4   Title Pt will complete formal cognitive linguistic assessment in first 2 sessions    Status Achieved      SLP SHORT TERM GOAL #5   Title Pt will carryover external aids to recall to-do list, appointments and pertinent information with occasional min A over 3 sessions    Status Deferred            SLP Long Term Goals - 12/30/20 1747      SLP LONG TERM GOAL #1   Title Pt will use word finding compensations in 10 simple conversation with occasional min A over 2 sessions    Baseline 12-27-20, 12-30-20    Period --   or 29 visits for all LTGs   Status Achieved      SLP LONG TERM GOAL #2   Title Pt will generate functional sentences to improve communication of simple wants/needs with min A over 2 sessions    Baseline 11-20-20    Time 2    Period Weeks    Status On-going      SLP LONG TERM GOAL #3   Title Pt will use external aids to recall to-do list, appointments and pertinent information with rare min A over 3 sessions    Time 2    Period Weeks    Status On-going      SLP LONG TERM GOAL #4   Title Pt will use multimodal communication to augment verbal expression of wants/needs with usual min A over 2 sessions    Baseline 12-27-20    Time 2    Period Weeks    Status On-going      SLP LONG TERM GOAL #5   Title Pt will undergo objective swallow study to evaluate pharyngeal function and assess for risk of aspiration if ongoing swallow deficits reported    Baseline WNL swallow    Status Achieved            Plan - 12/30/20 [place] presents with persisting mild to moderate expressive aphasia and cognitive linguistic  deficits. MBSS completed on Friday, with functional swallow function exhibited. Pt continues to c/o of globus sensation. SLP targeted verbal expression in simple conversation, with occasional min A required due to dysnomia. SLP  suggested use of external written aids to aid recall and completion of tasks at home, which was not utilized as recommended. SLP questions overall impact of cognitive deficits on language deficits which is difficult to decipher given aphasia. 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    Speech Therapy Frequency 2x / week    Duration Other (comment)   6 more weeks or 29 total visits   Treatment/Interventions Compensatory strategies;Patient/family education;Functional tasks;Cueing hierarchy;Multimodal communcation approach;Cognitive reorganization;Compensatory techniques;Internal/external aids;SLP instruction and feedback;Aspiration precaution training;Pharyngeal strengthening exercises;Diet toleration management by SLP    Potential to Achieve Goals Fair    Potential Considerations Ability to learn/carryover information;Severity of impairments;Cooperation/participation level    SLP Home Exercise Plan provided    Consulted and Agree with Plan of Care Patient;Family member/caregiver    Family Member Consulted [name]           Patient will benefit from skilled therapeutic intervention in order to improve the following deficits and impairments:   Aphasia  Cognitive communication deficit    Problem List Patient Active Problem List   Diagnosis Date Noted  . Aphasia 11/04/2020  . Memory loss 11/04/2020  . Vitamin D deficiency 09/30/2020  . CVA (cerebral vascular accident) ([place]) 04/26/2019  . Non-STEMI (non-ST elevated myocardial infarction) ([place])   . Non-obstructive CAD   . Hypertension   . TIA (transient  ischemic attack)   . Lacunar infarct, acute ([place]) 10/03/2015  . CKD (chronic kidney disease) stage 3, GFR 30-59 ml/min ([facility]) 10/03/2015  . Acute stroke due to ischemia ([place])   . HLD (hyperlipidemia)   . OA (osteoarthritis) of knee 12/31/2014  . Syncope 09/28/2014  . Hyperlipidemia 04/17/2014  . Atherosclerosis of native coronary artery of native heart without angina pectoris 10/05/2013  . NSTEMI (non-ST elevated myocardial infarction) ([place]) 09/29/2013  . Chest pain 09/28/2013  . Essential hypertension 09/28/2013  . Acute renal failure ([place]) 09/28/2013    Alinda Deem, MA CCC-SLP 12/30/2020, 5:52 PM  [facility] [address] [place], [place], [address] Phone: [phone]   Fax:  [phone]   Name: Crystal Day MRN: [id number] Date of Birth: Sep 18, 1934

## 2021-01-01 ENCOUNTER — Ambulatory Visit: Payer: PPO

## 2021-01-01 ENCOUNTER — Other Ambulatory Visit: Payer: Self-pay

## 2021-01-01 ENCOUNTER — Other Ambulatory Visit: Payer: Self-pay | Admitting: Neurology

## 2021-01-01 DIAGNOSIS — R4701 Aphasia: Secondary | ICD-10-CM | POA: Diagnosis not present

## 2021-01-01 DIAGNOSIS — R41841 Cognitive communication deficit: Secondary | ICD-10-CM

## 2021-01-01 NOTE — Therapy (Signed)
[facility] [address] [place], [place], [address] Phone: [phone]   Fax:  [phone]  Speech Language Pathology Treatment  Patient Details  Name: Crystal Day MRN: [id number] Date of Birth: 07-Jul-1935 Referring Provider (SLP): Ref: Benjiman Core PA (Neuro: Arlice Colt MD)   Encounter Date: 01/01/2021   End of Session - 01/01/21 1602    Visit Number 25    Number of Visits 29    Date for SLP Re-Evaluation 02/02/21    SLP Start Time 1448    SLP Stop Time  4235    SLP Time Calculation (min) 42 min    Activity Tolerance Patient tolerated treatment well           Past Medical History:  Diagnosis Date  . Anginal pain ([place]) since 2014  . Arthritis    just in the hands  . Cancer ([place]) 1988 or 89   basal cell carcinoma on face s/p Mohs procedure  . Complication of anesthesia    nausea  . History of wheezing    in spring and fall  . Hyperlipidemia   . Hypertension   . Non-obstructive CAD    a. 09/2013 Cath/NSTEMI: nonobs dzs.  . Non-STEMI (non-ST elevated myocardial infarction) ([place])    a. 09/2013 - peak trop 5.38;  b. 09/2013 Cath: nonobs dzs-->Med Rx.  . Pneumonia    "in my 20's"  . PONV (postoperative nausea and vomiting)   . Stroke [facility]) 2009   a. 09/2015 L Thalamic Lacunar infarct, presumed to be 2/2 small vessel dzs, residuals include numbness on right side of lips and right finger tips; b. 09/2015 Echo: nl LV fxn; c. 09/2015 Carotid U/S: mild bilat ICA stenosis.    Past Surgical History:  Procedure Laterality Date  . ABDOMINAL HYSTERECTOMY  1974  . BREAST SURGERY Left 1976   lumpectomy  . EYE SURGERY     cataract surgery, both eyes  . FRACTURE SURGERY Right 1971   thumb and index finger  . LEFT HEART CATHETERIZATION WITH CORONARY ANGIOGRAM N/A 09/29/2013   Procedure: LEFT HEART CATHETERIZATION WITH CORONARY ANGIOGRAM;  Surgeon: Peter M Martinique, MD;  Location: [facility] CATH LAB;  Service: Cardiovascular;   Laterality: N/A;  . PARTIAL KNEE ARTHROPLASTY Right 12/31/2014   Procedure: RIGHT KNEE MEDIAL UNICOMPARTMENTAL ARTHROPLASTY;  Surgeon: Gaynelle Arabian, MD;  Location: [facility] ORS;  Service: Orthopedics;  Laterality: Right;    There were no vitals filed for this visit.   Subjective Assessment - 01/01/21 1448    Subjective "we've been running all around"    Patient is accompained by: Family member   [name]   Currently in Pain? No/denies                 ADULT SLP TREATMENT - 01/01/21 1600      General Information   Behavior/Cognition Alert;Cooperative;Confused;Pleasant mood      Treatment Provided   Treatment provided Cognitive-Linquistic      Cognitive-Linquistic Treatment   Treatment focused on Aphasia;Cognition;Patient/family/caregiver education    Skilled Treatment Pt provided details of busy day, in which pt's daughter provided intermittent phonemic and semantic cues to name places they attended. SLP targeting writing and visual cues, which minimally improved pt's naming. In 10+ minute simple conversation, pt able to converse with occasional word finding. Occasional re-direction to question asked and moderate cues required to improve overall message. SLP reviewed compensations to utilize at home, including use of written visual aids for naming and recall.  Assessment / Recommendations / Plan   Plan Continue with current plan of care      Progression Toward Goals   Progression toward goals Progressing toward goals            SLP Education - 01/01/21 1602    Education Details external aids, reading aloud, conversational practice    Person(s) Educated Patient;Child(ren)    Methods Explanation;Demonstration;Handout    Comprehension Verbalized understanding;Returned demonstration;Need further instruction            SLP Short Term Goals - 11/11/20 1656      SLP SHORT TERM GOAL #1   Title Pt will demonstrate use of word finding compensations in simple 5 minute  conversation with min A over 2 sessions    Baseline 11-04-20    Status Partially Met      SLP SHORT TERM GOAL #2   Title Pt's caregiver will appropriately cue patient when word finding episodes occur with rare min A over 2 sessions    Baseline 10-28-20, 10-30-20    Status Achieved      SLP SHORT TERM GOAL #3   Title Pt will generate functional sentences to improve communication of basic wants/needs with min A over 2 sessions    Baseline 11-06-20    Status Partially Met      SLP SHORT TERM GOAL #4   Title Pt will complete formal cognitive linguistic assessment in first 2 sessions    Status Achieved      SLP SHORT TERM GOAL #5   Title Pt will carryover external aids to recall to-do list, appointments and pertinent information with occasional min A over 3 sessions    Status Deferred            SLP Long Term Goals - 01/01/21 1602      SLP LONG TERM GOAL #1   Title Pt will use word finding compensations in 10 simple conversation with occasional min A over 2 sessions    Baseline 12-27-20, 12-30-20    Period --   or 29 visits for all LTGs   Status Achieved      SLP LONG TERM GOAL #2   Title Pt will generate functional sentences to improve communication of simple wants/needs with min A over 2 sessions    Baseline 11-20-20    Time 2    Period Weeks    Status On-going      SLP LONG TERM GOAL #3   Title Pt will use external aids to recall to-do list, appointments and pertinent information with rare min A over 3 sessions    Time 2    Period Weeks    Status On-going      SLP LONG TERM GOAL #4   Title Pt will use multimodal communication to augment verbal expression of wants/needs with usual min A over 2 sessions    Baseline 12-27-20    Time 2    Period Weeks    Status On-going      SLP LONG TERM GOAL #5   Title Pt will undergo objective swallow study to evaluate pharyngeal function and assess for risk of aspiration if ongoing swallow deficits reported    Baseline WNL swallow     Status Achieved            Plan - 01/01/21 1603    Clinical Impression Statement Paytyn presents with persisting mild to moderate expressive aphasia and cognitive linguistic deficits. SLP targeted verbal expression in simple conversation and naming of functional  information (places, foods, etc), with occasional min to mod A required due to dysnomia. SLP re-educated use of external written aids to aid recall and completion of tasks at home, which was not utilized as recommended the last two sessions. SLP questions overall impact of cognitive deficits on language deficits which is difficult to decipher given aphasia. 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    Speech Therapy Frequency 2x / week    Duration Other (comment)   6 more weeks or 29 total visits   Treatment/Interventions Compensatory strategies;Patient/family education;Functional tasks;Cueing hierarchy;Multimodal communcation approach;Cognitive reorganization;Compensatory techniques;Internal/external aids;SLP instruction and feedback;Aspiration precaution training;Pharyngeal strengthening exercises;Diet toleration management by SLP    Potential to Achieve Goals Fair    Potential Considerations Ability to learn/carryover information;Severity of impairments;Cooperation/participation level    SLP Home Exercise Plan provided    Consulted and Agree with Plan of Care Patient;Family member/caregiver    Family Member Consulted [name]           Patient will benefit from skilled therapeutic intervention in order to improve the following deficits and impairments:   Aphasia  Cognitive communication deficit    Problem List Patient Active Problem List   Diagnosis Date Noted  . Aphasia 11/04/2020  . Memory loss 11/04/2020  . Vitamin D deficiency 09/30/2020  . CVA (cerebral  vascular accident) ([place]) 04/26/2019  . Non-STEMI (non-ST elevated myocardial infarction) ([place])   . Non-obstructive CAD   . Hypertension   . TIA (transient ischemic attack)   . Lacunar infarct, acute ([place]) 10/03/2015  . CKD (chronic kidney disease) stage 3, GFR 30-59 ml/min ([facility]) 10/03/2015  . Acute stroke due to ischemia ([place])   . HLD (hyperlipidemia)   . OA (osteoarthritis) of knee 12/31/2014  . Syncope 09/28/2014  . Hyperlipidemia 04/17/2014  . Atherosclerosis of native coronary artery of native heart without angina pectoris 10/05/2013  . NSTEMI (non-ST elevated myocardial infarction) ([place]) 09/29/2013  . Chest pain 09/28/2013  . Essential hypertension 09/28/2013  . Acute renal failure ([place]) 09/28/2013    Alinda Deem, MA CCC-SLP 01/01/2021, 4:04 PM  [facility] [address] [place], [place], [address] Phone: [phone]   Fax:  [phone]   Name: CATLIN DORIA MRN: [id number] Date of Birth: 18-Apr-1935

## 2021-01-06 ENCOUNTER — Ambulatory Visit: Payer: PPO

## 2021-01-06 ENCOUNTER — Other Ambulatory Visit: Payer: Self-pay

## 2021-01-06 DIAGNOSIS — R4701 Aphasia: Secondary | ICD-10-CM

## 2021-01-06 DIAGNOSIS — R41841 Cognitive communication deficit: Secondary | ICD-10-CM

## 2021-01-07 NOTE — Therapy (Signed)
[facility] [address] [place], [place], [address] Phone: [phone]   Fax:  [phone]  Speech Language Pathology Treatment  Patient Details  Name: Crystal Day MRN: [id number] Date of Birth: [birth year] Referring Provider (SLP): Ref: Benjiman Core PA (Neuro: Arlice Colt MD)   Encounter Date: 01/06/2021   End of Session - 01/07/21 0834    Visit Number 26    Number of Visits 29    Date for SLP Re-Evaluation 02/02/21    SLP Start Time 1703    SLP Stop Time  7588    SLP Time Calculation (min) 43 min    Activity Tolerance Patient tolerated treatment well           Past Medical History:  Diagnosis Date  . Anginal pain ([place]) since 2014  . Arthritis    just in the hands  . Cancer ([place]) 1988 or 89   basal cell carcinoma on face s/p Mohs procedure  . Complication of anesthesia    nausea  . History of wheezing    in spring and fall  . Hyperlipidemia   . Hypertension   . Non-obstructive CAD    a. 09/2013 Cath/NSTEMI: nonobs dzs.  . Non-STEMI (non-ST elevated myocardial infarction) ([place])    a. 09/2013 - peak trop 5.38;  b. 09/2013 Cath: nonobs dzs-->Med Rx.  . Pneumonia    "in my 20's"  . PONV (postoperative nausea and vomiting)   . Stroke [facility]) 2009   a. 09/2015 L Thalamic Lacunar infarct, presumed to be 2/2 small vessel dzs, residuals include numbness on right side of lips and right finger tips; b. 09/2015 Echo: nl LV fxn; c. 09/2015 Carotid U/S: mild bilat ICA stenosis.    Past Surgical History:  Procedure Laterality Date  . ABDOMINAL HYSTERECTOMY  1974  . BREAST SURGERY Left 1976   lumpectomy  . EYE SURGERY     cataract surgery, both eyes  . FRACTURE SURGERY Right 1971   thumb and index finger  . LEFT HEART CATHETERIZATION WITH CORONARY ANGIOGRAM N/A 09/29/2013   Procedure: LEFT HEART CATHETERIZATION WITH CORONARY ANGIOGRAM;  Surgeon: Peter M Martinique, MD;  Location: [facility] CATH LAB;  Service: Cardiovascular;   Laterality: N/A;  . PARTIAL KNEE ARTHROPLASTY Right 12/31/2014   Procedure: RIGHT KNEE MEDIAL UNICOMPARTMENTAL ARTHROPLASTY;  Surgeon: Gaynelle Arabian, MD;  Location: [facility];  Service: Orthopedics;  Laterality: Right;    There were no vitals filed for this visit.   Subjective Assessment - 01/07/21 0830    Subjective "same old same old"    Patient is accompained by: Family member    Currently in Pain? No/denies                 ADULT SLP TREATMENT - 01/06/21 1703      General Information   Behavior/Cognition Alert;Cooperative;Confused;Pleasant mood      Treatment Provided   Treatment provided Cognitive-Linquistic      Cognitive-Linquistic Treatment   Treatment focused on Aphasia;Cognition;Patient/family/caregiver education    Skilled Treatment With prompting from daughter, pt verbalized recent weekend events. Occasional min to mod verbal and visual cues required to address anomia. Pt noted to become frustrated with inabilty to name "dance recital" which pt did achieve with additional processing time and min carrier phrase. SLP emphasized how frustration tolerance can impact word finding even further. SLP targeted verbalizing difference between two items, in which pt able to complete with 55% accuracy given min prompting. Occasional perseverations noted.  Assessment / Recommendations / Plan   Plan Continue with current plan of care      Progression Toward Goals   Progression toward goals Not progressing toward goals (comment)   limited carryover at home/motivation           SLP Education - 01/07/21 0834    Education Details HEP, functional practice, motivation    Person(s) Educated Patient;Child(ren)    Methods Explanation;Demonstration;Handout    Comprehension Verbalized understanding;Returned demonstration;Need further instruction            SLP Short Term Goals - 11/11/20 1656      SLP SHORT TERM GOAL #1   Title Pt will demonstrate use of word finding  compensations in simple 5 minute conversation with min A over 2 sessions    Baseline 11-04-20    Status Partially Met      SLP SHORT TERM GOAL #2   Title Pt's caregiver will appropriately cue patient when word finding episodes occur with rare min A over 2 sessions    Baseline 10-28-20, 10-30-20    Status Achieved      SLP SHORT TERM GOAL #3   Title Pt will generate functional sentences to improve communication of basic wants/needs with min A over 2 sessions    Baseline 11-06-20    Status Partially Met      SLP SHORT TERM GOAL #4   Title Pt will complete formal cognitive linguistic assessment in first 2 sessions    Status Achieved      SLP SHORT TERM GOAL #5   Title Pt will carryover external aids to recall to-do list, appointments and pertinent information with occasional min A over 3 sessions    Status Deferred            SLP Long Term Goals - 01/06/21 1715      SLP LONG TERM GOAL #1   Title Pt will use word finding compensations in 10 simple conversation with occasional min A over 2 sessions    Baseline 12-27-20, 12-30-20    Period --   or 29 visits for all LTGs   Status Achieved      SLP LONG TERM GOAL #2   Title Pt will generate functional sentences to improve communication of simple wants/needs with min A over 2 sessions    Baseline 11-20-20, 01-06-21    Time --    Period --    Status Achieved      SLP LONG TERM GOAL #3   Title Pt will use external aids to recall to-do list, appointments and pertinent information with rare min A over 3 sessions    Time 1    Period Weeks    Status On-going      SLP LONG TERM GOAL #4   Title Pt will use multimodal communication to augment verbal expression of wants/needs with usual min A over 2 sessions    Baseline 12-27-20    Time 2    Period Weeks    Status On-going      SLP LONG TERM GOAL #5   Title Pt will undergo objective swallow study to evaluate pharyngeal function and assess for risk of aspiration if ongoing swallow deficits  reported    Baseline WNL swallow    Status Achieved            Plan - 01/07/21 0835    Clinical Impression Statement Estalee presents with persisting mild to moderate expressive aphasia and cognitive linguistic deficits. SLP targeted verbal expression in  simple conversation and naming of functional information (places, foods, etc), with occasional min to mod A required due to dysnomia. SLP continues to educate on opportunities to practice at home and optimize expressive langauge skills given recent plateau in motivation and engagement. SLP questions overall impact of cognitive deficits on language deficits which is difficult to decipher given aphasia. 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    Speech Therapy Frequency 2x / week    Duration Other (comment)   6 more weeks or 29 total visits   Treatment/Interventions Compensatory strategies;Patient/family education;Functional tasks;Cueing hierarchy;Multimodal communcation approach;Cognitive reorganization;Compensatory techniques;Internal/external aids;SLP instruction and feedback;Aspiration precaution training;Pharyngeal strengthening exercises;Diet toleration management by SLP    Potential to Achieve Goals Fair    Potential Considerations Ability to learn/carryover information;Severity of impairments;Cooperation/participation level    SLP Home Exercise Plan provided    Consulted and Agree with Plan of Care Patient;Family member/caregiver    Family Member Consulted [name]           Patient will benefit from skilled therapeutic intervention in order to improve the following deficits and impairments:   Aphasia  Cognitive communication deficit    Problem List Patient Active Problem List   Diagnosis Date Noted  . Aphasia 11/04/2020  . Memory loss 11/04/2020  . Vitamin D  deficiency 09/30/2020  . CVA (cerebral vascular accident) ([place]) 04/26/2019  . Non-STEMI (non-ST elevated myocardial infarction) ([place])   . Non-obstructive CAD   . Hypertension   . TIA (transient ischemic attack)   . Lacunar infarct, acute ([place]) 10/03/2015  . CKD (chronic kidney disease) stage 3, GFR 30-59 ml/min ([facility]) 10/03/2015  . Acute stroke due to ischemia ([place])   . HLD (hyperlipidemia)   . OA (osteoarthritis) of knee 12/31/2014  . Syncope 09/28/2014  . Hyperlipidemia 04/17/2014  . Atherosclerosis of native coronary artery of native heart without angina pectoris 10/05/2013  . NSTEMI (non-ST elevated myocardial infarction) ([place]) 09/29/2013  . Chest pain 09/28/2013  . Essential hypertension 09/28/2013  . Acute renal failure ([place]) 09/28/2013    Alinda Deem , MA CCC-SLP 01/07/2021, 8:37 AM  [facility] [address] [place], [place], [address] Phone: [phone]   Fax:  [phone]   Name: ROTUNDA WORDEN MRN: [id number] Date of Birth: 06/25/1935

## 2021-01-08 ENCOUNTER — Other Ambulatory Visit: Payer: Self-pay

## 2021-01-08 ENCOUNTER — Ambulatory Visit: Payer: PPO

## 2021-01-08 DIAGNOSIS — R4701 Aphasia: Secondary | ICD-10-CM | POA: Diagnosis not present

## 2021-01-08 DIAGNOSIS — R41841 Cognitive communication deficit: Secondary | ICD-10-CM

## 2021-01-08 NOTE — Therapy (Signed)
[facility] [address] [place], [place], [address] Phone: [phone]   Fax:  [phone]  Speech Language Pathology Treatment  Patient Details  Name: Crystal Day MRN: [id number] Date of Birth: 02-Jul-1935 Referring Provider (SLP): Ref: Benjiman Core PA (Neuro: Arlice Colt MD)   Encounter Date: 01/08/2021   End of Session - 01/08/21 1656    Visit Number 27    Number of Visits 29    Date for SLP Re-Evaluation 02/02/21    SLP Start Time 2    SLP Stop Time  8676    SLP Time Calculation (min) 45 min    Activity Tolerance Patient tolerated treatment well           Past Medical History:  Diagnosis Date  . Anginal pain ([place]) since 2014  . Arthritis    just in the hands  . Cancer ([place]) 1988 or 89   basal cell carcinoma on face s/p Mohs procedure  . Complication of anesthesia    nausea  . History of wheezing    in spring and fall  . Hyperlipidemia   . Hypertension   . Non-obstructive CAD    a. 09/2013 Cath/NSTEMI: nonobs dzs.  . Non-STEMI (non-ST elevated myocardial infarction) ([place])    a. 09/2013 - peak trop 5.38;  b. 09/2013 Cath: nonobs dzs-->Med Rx.  . Pneumonia    "in my 20's"  . PONV (postoperative nausea and vomiting)   . Stroke [facility]) 2009   a. 09/2015 L Thalamic Lacunar infarct, presumed to be 2/2 small vessel dzs, residuals include numbness on right side of lips and right finger tips; b. 09/2015 Echo: nl LV fxn; c. 09/2015 Carotid U/S: mild bilat ICA stenosis.    Past Surgical History:  Procedure Laterality Date  . ABDOMINAL HYSTERECTOMY  1974  . BREAST SURGERY Left 1976   lumpectomy  . EYE SURGERY     cataract surgery, both eyes  . FRACTURE SURGERY Right 1971   thumb and index finger  . LEFT HEART CATHETERIZATION WITH CORONARY ANGIOGRAM N/A 09/29/2013   Procedure: LEFT HEART CATHETERIZATION WITH CORONARY ANGIOGRAM;  Surgeon: Peter M Martinique, MD;  Location: [facility] CATH LAB;  Service: Cardiovascular;   Laterality: N/A;  . PARTIAL KNEE ARTHROPLASTY Right 12/31/2014   Procedure: RIGHT KNEE MEDIAL UNICOMPARTMENTAL ARTHROPLASTY;  Surgeon: Gaynelle Arabian, MD;  Location: [facility] ORS;  Service: Orthopedics;  Laterality: Right;    There were no vitals filed for this visit.   Subjective Assessment - 01/08/21 1657    Subjective "I just didn't sleep well last night."    Patient is accompained by: Family member    Currently in Pain? No/denies                 ADULT SLP TREATMENT - 01/08/21 1655      General Information   Behavior/Cognition Alert;Cooperative;Confused;Pleasant mood      Treatment Provided   Treatment provided Cognitive-Linquistic      Cognitive-Linquistic Treatment   Treatment focused on Aphasia;Cognition;Patient/family/caregiver education    Skilled Treatment Pt endorsed feeling "sleepy" today, which seemingly impacted performance this session. SLP reviewed HEP, in which <50% of task completed for generation of associations of targeted word. Pt able to ID 1-4 items for 6/12 tasks with occasional errors (ex: ice for hot). Min prompting increasing to usual mod prompting and usual mod-max verbal cues (carrier phrases, semantic cues, and first letter) required to name additional items. Pt continues to rely on daughter for prompting.  Assessment / Recommendations / Plan   Plan Continue with current plan of care      Progression Toward Goals   Progression toward goals Not progressing toward goals (comment)   limited carryover at home/reduced motivation             SLP Short Term Goals - 11/11/20 1656      SLP SHORT TERM GOAL #1   Title Pt will demonstrate use of word finding compensations in simple 5 minute conversation with min A over 2 sessions    Baseline 11-04-20    Status Partially Met      SLP SHORT TERM GOAL #2   Title Pt's caregiver will appropriately cue patient when word finding episodes occur with rare min A over 2 sessions    Baseline 10-28-20, 10-30-20     Status Achieved      SLP SHORT TERM GOAL #3   Title Pt will generate functional sentences to improve communication of basic wants/needs with min A over 2 sessions    Baseline 11-06-20    Status Partially Met      SLP SHORT TERM GOAL #4   Title Pt will complete formal cognitive linguistic assessment in first 2 sessions    Status Achieved      SLP SHORT TERM GOAL #5   Title Pt will carryover external aids to recall to-do list, appointments and pertinent information with occasional min A over 3 sessions    Status Deferred            SLP Long Term Goals - 01/08/21 1656      SLP LONG TERM GOAL #1   Title Pt will use word finding compensations in 10 simple conversation with occasional min A over 2 sessions    Baseline 12-27-20, 12-30-20    Period --   or 29 visits for all LTGs   Status Achieved      SLP LONG TERM GOAL #2   Title Pt will generate functional sentences to improve communication of simple wants/needs with min A over 2 sessions    Baseline 11-20-20, 01-06-21    Status Achieved      SLP LONG TERM GOAL #3   Title Pt will use external aids to recall to-do list, appointments and pertinent information with rare min A over 3 sessions    Time 1    Period Weeks    Status On-going      SLP LONG TERM GOAL #4   Title Pt will use multimodal communication to augment verbal expression of wants/needs with usual min A over 2 sessions    Baseline 12-27-20    Time 2    Period Weeks    Status On-going      SLP LONG TERM GOAL #5   Title Pt will undergo objective swallow study to evaluate pharyngeal function and assess for risk of aspiration if ongoing swallow deficits reported    Baseline WNL swallow    Status Achieved            Plan - 01/08/21 1656    Clinical Impression Statement Wavie presents with persisting mild to moderate expressive aphasia and cognitive linguistic deficits. SLP targeted verbal expression in simple conversation and naming associations of targeted word, with  usual min to mod A required due to dysnomia. Pt continues to rely on daughter for usual prompting due to anomia and impaired recall. SLP continues to educate on opportunities to practice at home and optimize expressive langauge skills given recent plateau in  motivation and engagement. SLP questions overall impact of cognitive deficits on language deficits which is difficult to decipher given aphasia. 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    Speech Therapy Frequency 2x / week    Duration Other (comment)   6 more weeks or 29 total visits   Treatment/Interventions Compensatory strategies;Patient/family education;Functional tasks;Cueing hierarchy;Multimodal communcation approach;Cognitive reorganization;Compensatory techniques;Internal/external aids;SLP instruction and feedback;Aspiration precaution training;Pharyngeal strengthening exercises;Diet toleration management by SLP    Potential to Achieve Goals Fair    Potential Considerations Ability to learn/carryover information;Severity of impairments;Cooperation/participation level    SLP Home Exercise Plan provided    Consulted and Agree with Plan of Care Patient;Family member/caregiver    Family Member Consulted [name]           Patient will benefit from skilled therapeutic intervention in order to improve the following deficits and impairments:   Aphasia  Cognitive communication deficit    Problem List Patient Active Problem List   Diagnosis Date Noted  . Aphasia 11/04/2020  . Memory loss 11/04/2020  . Vitamin D deficiency 09/30/2020  . CVA (cerebral vascular accident) ([place]) 04/26/2019  . Non-STEMI (non-ST elevated myocardial infarction) ([place])   . Non-obstructive CAD   . Hypertension   . TIA (transient ischemic attack)   . Lacunar infarct, acute ([place]) 10/03/2015  . CKD  (chronic kidney disease) stage 3, GFR 30-59 ml/min ([facility]) 10/03/2015  . Acute stroke due to ischemia ([place])   . HLD (hyperlipidemia)   . OA (osteoarthritis) of knee 12/31/2014  . Syncope 09/28/2014  . Hyperlipidemia 04/17/2014  . Atherosclerosis of native coronary artery of native heart without angina pectoris 10/05/2013  . NSTEMI (non-ST elevated myocardial infarction) ([place]) 09/29/2013  . Chest pain 09/28/2013  . Essential hypertension 09/28/2013  . Acute renal failure ([place]) 09/28/2013    Alinda Deem, MA CCC-SLP 01/08/2021, 5:49 PM  [facility] [address] [place], [place], [address] Phone: [phone]   Fax:  [phone]   Name: Crystal Day MRN: [id number] Date of Birth: 1935/01/26

## 2021-01-15 ENCOUNTER — Other Ambulatory Visit: Payer: Self-pay

## 2021-01-15 ENCOUNTER — Ambulatory Visit: Payer: PPO | Attending: Physician Assistant

## 2021-01-15 DIAGNOSIS — R41841 Cognitive communication deficit: Secondary | ICD-10-CM | POA: Diagnosis not present

## 2021-01-15 DIAGNOSIS — R4701 Aphasia: Secondary | ICD-10-CM | POA: Diagnosis not present

## 2021-01-15 NOTE — Patient Instructions (Signed)
Memory Compensation Strategies  1. Use "WARM" strategy. W= write it down A=  associate it R=  repeat it M=  make a mental picture  2. You can keep a Social worker. Use a 3-ring notebook with sections for the following:  calendar, important names and phone numbers, medications, doctors' names/phone numbers, "to do list"/reminders, and a section to journal what you did each day  3. Use a calendar to write appointments down.  4. Write yourself a schedule for the day.  This can be placed on the calendar or in a separate section of the Memory Notebook.  Keeping a regular schedule can help memory.  5. Use medication organizer with sections for each day or morning/evening pills  You may need help loading it  6. Keep a basket, or pegboard by the door.   Place items that you need to take out with you in the basket or on the pegboard.  You may also want to include a message board for reminders.  7. Use sticky notes. Place sticky notes with reminders in a place where the task is performed.  For example:  "turn off the stove" placed by the stove, "lock the door" placed on the door at eye level, "take your medications" on the bathroom mirror or by the place where you normally take your medications  8. Use alarms/timers.  Use while cooking to remind yourself to check on food or as a reminder to take your medicine, or as a reminder to make a call, or as a reminder to perform another task, etc.  9. Use a voice recorder app or small tape recorder to record important information and notes for yourself. Go back at the end of the day and listen to these.      Play the memory game  Try to remember 3-5 items on your store list without looking  Study a detailed picture in a magazine for 1 minute, then write down everything you can remember from the picture

## 2021-01-15 NOTE — Therapy (Signed)
[facility] [address] [place], [place], [address] Phone: [phone]   Fax:  [phone]  Speech Language Pathology Treatment  Patient Details  Name: Crystal Day MRN: [id number] Date of Birth: 02/17/1935 Referring Provider (SLP): Ref: Benjiman Core PA (Neuro: Arlice Colt MD)   Encounter Date: 01/15/2021   End of Session - 01/15/21 1838    Visit Number 28    Number of Visits 29    Date for SLP Re-Evaluation 02/02/21    SLP Start Time 2683    SLP Stop Time  1835    SLP Time Calculation (min) 50 min    Activity Tolerance Patient tolerated treatment well           Past Medical History:  Diagnosis Date  . Anginal pain ([place]) since 2014  . Arthritis    just in the hands  . Cancer ([place]) 1988 or 89   basal cell carcinoma on face s/p Mohs procedure  . Complication of anesthesia    nausea  . History of wheezing    in spring and fall  . Hyperlipidemia   . Hypertension   . Non-obstructive CAD    a. 09/2013 Cath/NSTEMI: nonobs dzs.  . Non-STEMI (non-ST elevated myocardial infarction) ([place])    a. 09/2013 - peak trop 5.38;  b. 09/2013 Cath: nonobs dzs-->Med Rx.  . Pneumonia    "in my 20's"  . PONV (postoperative nausea and vomiting)   . Stroke [facility]) 2009   a. 09/2015 L Thalamic Lacunar infarct, presumed to be 2/2 small vessel dzs, residuals include numbness on right side of lips and right finger tips; b. 09/2015 Echo: nl LV fxn; c. 09/2015 Carotid U/S: mild bilat ICA stenosis.    Past Surgical History:  Procedure Laterality Date  . ABDOMINAL HYSTERECTOMY  1974  . BREAST SURGERY Left 1976   lumpectomy  . EYE SURGERY     cataract surgery, both eyes  . FRACTURE SURGERY Right 1971   thumb and index finger  . LEFT HEART CATHETERIZATION WITH CORONARY ANGIOGRAM N/A 09/29/2013   Procedure: LEFT HEART CATHETERIZATION WITH CORONARY ANGIOGRAM;  Surgeon: Peter M Martinique, MD;  Location: [facility] CATH LAB;  Service: Cardiovascular;   Laterality: N/A;  . PARTIAL KNEE ARTHROPLASTY Right 12/31/2014   Procedure: RIGHT KNEE MEDIAL UNICOMPARTMENTAL ARTHROPLASTY;  Surgeon: Gaynelle Arabian, MD;  Location: [facility] ORS;  Service: Orthopedics;  Laterality: Right;    There were no vitals filed for this visit.   Subjective Assessment - 01/15/21 1745    Subjective "Crystal Day"    Patient is accompained by: Family member   daughter, Crystal Day   Currently in Pain? No/denies                 ADULT SLP TREATMENT - 01/15/21 1744      General Information   Behavior/Cognition Alert;Cooperative;Confused;Pleasant mood      Treatment Provided   Treatment provided Cognitive-Linquistic      Cognitive-Linquistic Treatment   Treatment focused on Aphasia;Cognition;Patient/family/caregiver education    Skilled Treatment Pt completed naming HEP, with rare min A reportedly provided  to complete naming based on descriptions. Daughter indicated auditory reading of phrases were more effective compared to pt reading and completing independently. In conversation, pt presents with occasional anomia. Mod semantic and phonemic cues were effective to aid word finding. Increased recall deficits indicated this session, seemingly complicating word finding. SLP provided education and training re: memory compensations to implement at home. Daughter to monitor memory at home and  provide written list next session if increased concern for memory indicated.      Assessment / Recommendations / Plan   Plan Continue with current plan of care      Progression Toward Goals   Progression toward goals Not progressing toward goals (comment)   motivation, engagement           SLP Education - 01/15/21 2022    Education Details memory compensations, practice at home    Person(s) Educated Patient;Child(ren)    Methods Explanation;Demonstration    Comprehension Verbalized understanding;Returned demonstration;Need further instruction            SLP Short Term Goals -  11/11/20 1656      SLP SHORT TERM GOAL #1   Title Pt will demonstrate use of word finding compensations in simple 5 minute conversation with min A over 2 sessions    Baseline 11-04-20    Status Partially Met      SLP SHORT TERM GOAL #2   Title Pt's caregiver will appropriately cue patient when word finding episodes occur with rare min A over 2 sessions    Baseline 10-28-20, 10-30-20    Status Achieved      SLP SHORT TERM GOAL #3   Title Pt will generate functional sentences to improve communication of basic wants/needs with min A over 2 sessions    Baseline 11-06-20    Status Partially Met      SLP SHORT TERM GOAL #4   Title Pt will complete formal cognitive linguistic assessment in first 2 sessions    Status Achieved      SLP SHORT TERM GOAL #5   Title Pt will carryover external aids to recall to-do list, appointments and pertinent information with occasional min A over 3 sessions    Status Deferred            SLP Long Term Goals - 01/15/21 1838      SLP LONG TERM GOAL #1   Title Pt will use word finding compensations in 10 simple conversation with occasional min A over 2 sessions    Baseline 12-27-20, 12-30-20    Period --   or 29 visits for all LTGs   Status Achieved      SLP LONG TERM GOAL #2   Title Pt will generate functional sentences to improve communication of simple wants/needs with min A over 2 sessions    Baseline 11-20-20, 01-06-21    Status Achieved      SLP LONG TERM GOAL #3   Title Pt will use external aids to recall to-do list, appointments and pertinent information with rare min A over 3 sessions    Time 1    Period Weeks    Status On-going      SLP LONG TERM GOAL #4   Title Pt will use multimodal communication to augment verbal expression of wants/needs with usual min A over 2 sessions    Baseline 12-27-20    Time 1    Period Weeks    Status On-going      SLP LONG TERM GOAL #5   Title Pt will undergo objective swallow study to evaluate pharyngeal  function and assess for risk of aspiration if ongoing swallow deficits reported    Baseline WNL swallow    Status Achieved            Plan - 01/15/21 2024    Clinical Impression Statement Crystal Day presents with persisting mild to moderate expressive aphasia and cognitive linguistic deficits.  SLP targeted verbal expression in simple conversation and naming re: recent events, with occasional min to mod A required due to dysnomia. Pt continues to rely on daughter for usual prompting due to anomia and impaired recall. SLP continues to educate on opportunities to practice at home and optimize expressive langauge skills given recent plateau in motivation and engagement. SLP questions overall impact of cognitive deficits on language deficits which is difficult to decipher given aphasia. SLP provided education and instruction of memory compensations to use at home to aid recall. 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    Speech Therapy Frequency 2x / week    Duration Other (comment)   6 more weeks or 29 total visits   Treatment/Interventions Compensatory strategies;Patient/family education;Functional tasks;Cueing hierarchy;Multimodal communcation approach;Cognitive reorganization;Compensatory techniques;Internal/external aids;SLP instruction and feedback;Aspiration precaution training;Pharyngeal strengthening exercises;Diet toleration management by SLP    Potential to Achieve Goals Fair    Potential Considerations Ability to learn/carryover information;Severity of impairments;Cooperation/participation level    SLP Home Exercise Plan provided    Consulted and Agree with Plan of Care Patient;Family member/caregiver    Family Member Consulted Crystal Day           Patient will benefit from skilled therapeutic intervention in order to improve the  following deficits and impairments:   Aphasia  Cognitive communication deficit    Problem List Patient Active Problem List   Diagnosis Date Noted  . Aphasia 11/04/2020  . Memory loss 11/04/2020  . Vitamin D deficiency 09/30/2020  . CVA (cerebral vascular accident) ([place]) 04/26/2019  . Non-STEMI (non-ST elevated myocardial infarction) ([place])   . Non-obstructive CAD   . Hypertension   . TIA (transient ischemic attack)   . Lacunar infarct, acute ([place]) 10/03/2015  . CKD (chronic kidney disease) stage 3, GFR 30-59 ml/min ([facility]) 10/03/2015  . Acute stroke due to ischemia ([place])   . HLD (hyperlipidemia)   . OA (osteoarthritis) of knee 12/31/2014  . Syncope 09/28/2014  . Hyperlipidemia 04/17/2014  . Atherosclerosis of native coronary artery of native heart without angina pectoris 10/05/2013  . NSTEMI (non-ST elevated myocardial infarction) ([place]) 09/29/2013  . Chest pain 09/28/2013  . Essential hypertension 09/28/2013  . Acute renal failure ([place]) 09/28/2013    Alinda Deem, MA CCC-SLP 01/15/2021, 8:26 PM  [facility] [address] [place], [place], [address] Phone: [phone]   Fax:  [phone]   Name: Crystal Day MRN: [id number] Date of Birth: May 21, 1935

## 2021-01-20 ENCOUNTER — Ambulatory Visit: Payer: PPO

## 2021-01-21 ENCOUNTER — Ambulatory Visit: Payer: PPO

## 2021-01-21 ENCOUNTER — Other Ambulatory Visit: Payer: Self-pay

## 2021-01-21 ENCOUNTER — Telehealth: Payer: Self-pay | Admitting: *Deleted

## 2021-01-21 DIAGNOSIS — R4701 Aphasia: Secondary | ICD-10-CM | POA: Diagnosis not present

## 2021-01-21 DIAGNOSIS — R41841 Cognitive communication deficit: Secondary | ICD-10-CM

## 2021-01-21 NOTE — Therapy (Signed)
[facility] [address] [place], [place], [address] Phone: [phone]   Fax:  [phone]  Speech Language Pathology Treatment- Discharge Summary  Patient Details  Name: Crystal Day MRN: [id number] Date of Birth: Dec 19, 1934 Referring Provider (SLP): Ref: Benjiman Core PA (Neuro: Arlice Colt MD)   Encounter Date: 01/21/2021   End of Session - 01/21/21 1629    Visit Number 29    Number of Visits 29    Date for SLP Re-Evaluation 02/02/21    SLP Start Time 1615    SLP Stop Time  1700    SLP Time Calculation (min) 45 min    Activity Tolerance Patient tolerated treatment well           Past Medical History:  Diagnosis Date  . Anginal pain ([place]) since 2014  . Arthritis    just in the hands  . Cancer ([place]) 1988 or 89   basal cell carcinoma on face s/p Mohs procedure  . Complication of anesthesia    nausea  . History of wheezing    in spring and fall  . Hyperlipidemia   . Hypertension   . Non-obstructive CAD    a. 09/2013 Cath/NSTEMI: nonobs dzs.  . Non-STEMI (non-ST elevated myocardial infarction) ([place])    a. 09/2013 - peak trop 5.38;  b. 09/2013 Cath: nonobs dzs-->Med Rx.  . Pneumonia    "in my 20's"  . PONV (postoperative nausea and vomiting)   . Stroke [facility]) 2009   a. 09/2015 L Thalamic Lacunar infarct, presumed to be 2/2 small vessel dzs, residuals include numbness on right side of lips and right finger tips; b. 09/2015 Echo: nl LV fxn; c. 09/2015 Carotid U/S: mild bilat ICA stenosis.    Past Surgical History:  Procedure Laterality Date  . ABDOMINAL HYSTERECTOMY  1974  . BREAST SURGERY Left 1976   lumpectomy  . EYE SURGERY     cataract surgery, both eyes  . FRACTURE SURGERY Right 1971   thumb and index finger  . LEFT HEART CATHETERIZATION WITH CORONARY ANGIOGRAM N/A 09/29/2013   Procedure: LEFT HEART CATHETERIZATION WITH CORONARY ANGIOGRAM;  Surgeon: Peter M Martinique, MD;  Location: [facility] CATH LAB;   Service: Cardiovascular;  Laterality: N/A;  . PARTIAL KNEE ARTHROPLASTY Right 12/31/2014   Procedure: RIGHT KNEE MEDIAL UNICOMPARTMENTAL ARTHROPLASTY;  Surgeon: Gaynelle Arabian, MD;  Location: [facility] ORS;  Service: Orthopedics;  Laterality: Right;    There were no vitals filed for this visit.   Subjective Assessment - 01/21/21 1625    Subjective "I'm excited"    Patient is accompained by: Family member   daughter, [name]   Currently in Pain? No/denies             SPEECH THERAPY DISCHARGE SUMMARY  Visits from Start of Care: 29  Current functional level related to goals / functional outcomes: Crystal Day is discharging from skilled ST intervention targeting aphasia and cognition as pt exhibits max potential at this time. Recent reduced motivation and participation has impacted further additional progress. Pt has exhibited improvements in aphasia and word finding; however, pt continues to heavily rely on family for semantic, phonemic, and first letter cues. SLP has questioned impact of cognition, particularly memory, on persistent word finding difficulties. SLP has provided education and recommendations re: compensations for memory and aphasia, with inconsistent carryover indicated at home. Pt may benefit from break from therapy at this time and return if pt desires given additional script from MD. Pt and daughter declined further questions  at this time.    Remaining deficits: Memory deficits, aphasia  Education / Equipment: Anomia strategies, communication aids, memory compensations, caregiver education      ADULT SLP TREATMENT - 01/21/21 1627      General Information   Behavior/Cognition Alert;Cooperative;Confused;Pleasant mood      Treatment Provided   Treatment provided Cognitive-Linquistic      Cognitive-Linquistic Treatment   Treatment focused on Aphasia;Cognition;Patient/family/caregiver education    Skilled Treatment Pt expressed desire to discharge from [facility] on last scheduled visit  today. SLP provided additional naming resources to keep in purse for when word finding occurs. Pt and daughter verbalized understanding and agreement for ST discharge at this time. Both declined further questions.      Assessment / Recommendations / Plan   Plan Discharge SLP treatment due to (comment)   max potential at this time     Progression Toward Goals   Progression toward goals Goals partially met, education completed, patient discharged from SLP            SLP Education - 01/21/21 1628    Education Details discharge summary, returning in a couple months if pt desires    Person(s) Educated Patient;Child(ren)    Methods Explanation;Demonstration    Comprehension Verbalized understanding;Returned demonstration            SLP Short Term Goals - 11/11/20 1656      SLP SHORT TERM GOAL #1   Title Pt will demonstrate use of word finding compensations in simple 5 minute conversation with min A over 2 sessions    Baseline 11-04-20    Status Partially Met      SLP SHORT TERM GOAL #2   Title Pt's caregiver will appropriately cue patient when word finding episodes occur with rare min A over 2 sessions    Baseline 10-28-20, 10-30-20    Status Achieved      SLP SHORT TERM GOAL #3   Title Pt will generate functional sentences to improve communication of basic wants/needs with min A over 2 sessions    Baseline 11-06-20    Status Partially Met      SLP SHORT TERM GOAL #4   Title Pt will complete formal cognitive linguistic assessment in first 2 sessions    Status Achieved      SLP SHORT TERM GOAL #5   Title Pt will carryover external aids to recall to-do list, appointments and pertinent information with occasional min A over 3 sessions    Status Deferred            SLP Long Term Goals - 01/21/21 1630      SLP LONG TERM GOAL #1   Title Pt will use word finding compensations in 10 simple conversation with occasional min A over 2 sessions    Baseline 12-27-20, 12-30-20    Period  --   or 29 visits for all LTGs   Status Achieved      SLP LONG TERM GOAL #2   Title Pt will generate functional sentences to improve communication of simple wants/needs with min A over 2 sessions    Baseline 11-20-20, 01-06-21    Status Achieved      SLP LONG TERM GOAL #3   Title Pt will use external aids to recall to-do list, appointments and pertinent information with rare min A over 3 sessions    Status Not Met      SLP LONG TERM GOAL #4   Title Pt will use multimodal communication to augment  verbal expression of wants/needs with usual min A over 2 sessions    Baseline 12-27-20    Status Partially Met      SLP LONG TERM GOAL #5   Title Pt will undergo objective swallow study to evaluate pharyngeal function and assess for risk of aspiration if ongoing swallow deficits reported    Baseline WNL swallow    Status Achieved            Plan - 01/21/21 1640    Clinical Impression Statement Crystal Day presents with persisting mild to moderate expressive aphasia and cognitive linguistic deficits. Pt would desire to discharge from ST intervention on last scheduled visit this session. SLP reviewed anomia and memory compensations to continue to practice at home. Pt and daughter declined any further questions or concerns. SLP educated patient and daughter on requesting additional script if additional ST services are needed.    Treatment/Interventions Compensatory strategies;Patient/family education;Functional tasks;Cueing hierarchy;Multimodal communcation approach;Cognitive reorganization;Compensatory techniques;Internal/external aids;SLP instruction and feedback;Aspiration precaution training;Pharyngeal strengthening exercises;Diet toleration management by SLP    Consulted and Agree with Plan of Care Patient;Family member/caregiver    Family Member Consulted [name]           Patient will benefit from skilled therapeutic intervention in order to improve the following deficits and impairments:    Aphasia  Cognitive communication deficit    Problem List Patient Active Problem List   Diagnosis Date Noted  . Aphasia 11/04/2020  . Memory loss 11/04/2020  . Vitamin D deficiency 09/30/2020  . CVA (cerebral vascular accident) ([place]) 04/26/2019  . Non-STEMI (non-ST elevated myocardial infarction) ([place])   . Non-obstructive CAD   . Hypertension   . TIA (transient ischemic attack)   . Lacunar infarct, acute ([place]) 10/03/2015  . CKD (chronic kidney disease) stage 3, GFR 30-59 ml/min ([facility]) 10/03/2015  . Acute stroke due to ischemia ([place])   . HLD (hyperlipidemia)   . OA (osteoarthritis) of knee 12/31/2014  . Syncope 09/28/2014  . Hyperlipidemia 04/17/2014  . Atherosclerosis of native coronary artery of native heart without angina pectoris 10/05/2013  . NSTEMI (non-ST elevated myocardial infarction) ([place]) 09/29/2013  . Chest pain 09/28/2013  . Essential hypertension 09/28/2013  . Acute renal failure ([place]) 09/28/2013    Alinda Deem, MA CCC-SLP 01/21/2021, 4:51 PM  [facility] [address] [place], [place], [address] Phone: [phone]   Fax:  [phone]   Name: Crystal Day MRN: [id number] Date of Birth: 1935/03/17

## 2021-01-21 NOTE — Telephone Encounter (Signed)
-----   Message from Britt Bottom, MD sent at 01/20/2021  7:07 PM EDT ----- Please let her know that the heart monitor looked okay.  She did not have atrial fibrillation.

## 2021-01-21 NOTE — Telephone Encounter (Signed)
Called and spoke w/ daughter (on [place]) about results. She verbalized understanding.

## 2021-01-28 DIAGNOSIS — J449 Chronic obstructive pulmonary disease, unspecified: Secondary | ICD-10-CM | POA: Diagnosis not present

## 2021-01-28 DIAGNOSIS — I6350 Cerebral infarction due to unspecified occlusion or stenosis of unspecified cerebral artery: Secondary | ICD-10-CM | POA: Diagnosis not present

## 2021-01-28 DIAGNOSIS — E7800 Pure hypercholesterolemia, unspecified: Secondary | ICD-10-CM | POA: Diagnosis not present

## 2021-01-28 DIAGNOSIS — J4530 Mild persistent asthma, uncomplicated: Secondary | ICD-10-CM | POA: Diagnosis not present

## 2021-01-28 DIAGNOSIS — I6789 Other cerebrovascular disease: Secondary | ICD-10-CM | POA: Diagnosis not present

## 2021-01-28 DIAGNOSIS — I10 Essential (primary) hypertension: Secondary | ICD-10-CM | POA: Diagnosis not present

## 2021-01-28 DIAGNOSIS — E782 Mixed hyperlipidemia: Secondary | ICD-10-CM | POA: Diagnosis not present

## 2021-01-28 DIAGNOSIS — I63212 Cerebral infarction due to unspecified occlusion or stenosis of left vertebral arteries: Secondary | ICD-10-CM | POA: Diagnosis not present

## 2021-01-28 DIAGNOSIS — I2510 Atherosclerotic heart disease of native coronary artery without angina pectoris: Secondary | ICD-10-CM | POA: Diagnosis not present

## 2021-01-28 DIAGNOSIS — I214 Non-ST elevation (NSTEMI) myocardial infarction: Secondary | ICD-10-CM | POA: Diagnosis not present

## 2021-03-18 ENCOUNTER — Other Ambulatory Visit: Payer: Self-pay | Admitting: Neurology

## 2021-03-27 DIAGNOSIS — J449 Chronic obstructive pulmonary disease, unspecified: Secondary | ICD-10-CM | POA: Diagnosis not present

## 2021-03-27 DIAGNOSIS — Z0000 Encounter for general adult medical examination without abnormal findings: Secondary | ICD-10-CM | POA: Diagnosis not present

## 2021-03-27 DIAGNOSIS — I10 Essential (primary) hypertension: Secondary | ICD-10-CM | POA: Diagnosis not present

## 2021-03-27 DIAGNOSIS — Z1389 Encounter for screening for other disorder: Secondary | ICD-10-CM | POA: Diagnosis not present

## 2021-03-27 DIAGNOSIS — I6789 Other cerebrovascular disease: Secondary | ICD-10-CM | POA: Diagnosis not present

## 2021-03-27 DIAGNOSIS — E782 Mixed hyperlipidemia: Secondary | ICD-10-CM | POA: Diagnosis not present

## 2021-03-27 DIAGNOSIS — I2510 Atherosclerotic heart disease of native coronary artery without angina pectoris: Secondary | ICD-10-CM | POA: Diagnosis not present

## 2021-04-08 ENCOUNTER — Emergency Department (HOSPITAL_COMMUNITY): Payer: PPO

## 2021-04-08 ENCOUNTER — Emergency Department (HOSPITAL_COMMUNITY)
Admission: EM | Admit: 2021-04-08 | Discharge: 2021-04-08 | Disposition: A | Discharge status: Home / Self Care | Payer: PPO | Attending: Emergency Medicine | Admitting: Emergency Medicine

## 2021-04-08 DIAGNOSIS — I6782 Cerebral ischemia: Secondary | ICD-10-CM | POA: Diagnosis not present

## 2021-04-08 DIAGNOSIS — R29818 Other symptoms and signs involving the nervous system: Secondary | ICD-10-CM | POA: Diagnosis not present

## 2021-04-08 DIAGNOSIS — Z7901 Long term (current) use of anticoagulants: Secondary | ICD-10-CM | POA: Insufficient documentation

## 2021-04-08 DIAGNOSIS — R479 Unspecified speech disturbances: Secondary | ICD-10-CM | POA: Diagnosis not present

## 2021-04-08 DIAGNOSIS — I2510 Atherosclerotic heart disease of native coronary artery without angina pectoris: Secondary | ICD-10-CM | POA: Diagnosis not present

## 2021-04-08 DIAGNOSIS — I10 Essential (primary) hypertension: Secondary | ICD-10-CM | POA: Insufficient documentation

## 2021-04-08 DIAGNOSIS — R4789 Other speech disturbances: Secondary | ICD-10-CM | POA: Diagnosis present

## 2021-04-08 DIAGNOSIS — Z87891 Personal history of nicotine dependence: Secondary | ICD-10-CM | POA: Insufficient documentation

## 2021-04-08 DIAGNOSIS — I672 Cerebral atherosclerosis: Secondary | ICD-10-CM | POA: Diagnosis not present

## 2021-04-08 DIAGNOSIS — I6621 Occlusion and stenosis of right posterior cerebral artery: Secondary | ICD-10-CM | POA: Diagnosis not present

## 2021-04-08 DIAGNOSIS — I69320 Aphasia following cerebral infarction: Secondary | ICD-10-CM | POA: Diagnosis not present

## 2021-04-08 DIAGNOSIS — I252 Old myocardial infarction: Secondary | ICD-10-CM | POA: Diagnosis not present

## 2021-04-08 DIAGNOSIS — Z8673 Personal history of transient ischemic attack (TIA), and cerebral infarction without residual deficits: Secondary | ICD-10-CM

## 2021-04-08 DIAGNOSIS — J3489 Other specified disorders of nose and nasal sinuses: Secondary | ICD-10-CM | POA: Diagnosis not present

## 2021-04-08 DIAGNOSIS — R519 Headache, unspecified: Secondary | ICD-10-CM

## 2021-04-08 DIAGNOSIS — R4781 Slurred speech: Secondary | ICD-10-CM | POA: Diagnosis not present

## 2021-04-08 DIAGNOSIS — I639 Cerebral infarction, unspecified: Secondary | ICD-10-CM | POA: Diagnosis not present

## 2021-04-08 LAB — I-STAT CHEM 8, ED
BUN: 21 mg/dL (ref 8–23)
Calcium, Ion: 1.13 mmol/L — ABNORMAL LOW (ref 1.15–1.40)
Chloride: 102 mmol/L (ref 98–111)
Creatinine, Ser: 1.1 mg/dL — ABNORMAL HIGH (ref 0.44–1.00)
Glucose, Bld: 171 mg/dL — ABNORMAL HIGH (ref 70–99)
HCT: 44 % (ref 36.0–46.0)
Hemoglobin: 15 g/dL (ref 12.0–15.0)
Potassium: 4 mmol/L (ref 3.5–5.1)
Sodium: 137 mmol/L (ref 135–145)
TCO2: 28 mmol/L (ref 22–32)

## 2021-04-08 LAB — COMPREHENSIVE METABOLIC PANEL
ALT: 11 U/L (ref 0–44)
AST: 23 U/L (ref 15–41)
Albumin: 3.7 g/dL (ref 3.5–5.0)
Alkaline Phosphatase: 46 U/L (ref 38–126)
Anion gap: 7 (ref 5–15)
BUN: 17 mg/dL (ref 8–23)
CO2: 27 mmol/L (ref 22–32)
Calcium: 9.3 mg/dL (ref 8.9–10.3)
Chloride: 102 mmol/L (ref 98–111)
Creatinine, Ser: 1.23 mg/dL — ABNORMAL HIGH (ref 0.44–1.00)
GFR, Estimated: 43 mL/min — ABNORMAL LOW (ref >60)
Glucose, Bld: 178 mg/dL — ABNORMAL HIGH (ref 70–99)
Potassium: 4 mmol/L (ref 3.5–5.1)
Sodium: 136 mmol/L (ref 135–145)
Total Bilirubin: 0.6 mg/dL (ref 0.3–1.2)
Total Protein: 6.8 g/dL (ref 6.5–8.1)

## 2021-04-08 LAB — DIFFERENTIAL
Abs Immature Granulocytes: 0.04 10*3/uL (ref 0.00–0.07)
Basophils Absolute: 0.1 10*3/uL (ref 0.0–0.1)
Basophils Relative: 1 %
Eosinophils Absolute: 0 10*3/uL (ref 0.0–0.5)
Eosinophils Relative: 0 %
Immature Granulocytes: 1 %
Lymphocytes Relative: 9 %
Lymphs Abs: 0.7 10*3/uL (ref 0.7–4.0)
Monocytes Absolute: 0.3 10*3/uL (ref 0.1–1.0)
Monocytes Relative: 3 %
Neutro Abs: 6.6 10*3/uL (ref 1.7–7.7)
Neutrophils Relative %: 86 %

## 2021-04-08 LAB — CBC
HCT: 45.1 % (ref 36.0–46.0)
Hemoglobin: 14.6 g/dL (ref 12.0–15.0)
MCH: 28.7 pg (ref 26.0–34.0)
MCHC: 32.4 g/dL (ref 30.0–36.0)
MCV: 88.6 fL (ref 80.0–100.0)
Platelets: 202 10*3/uL (ref 150–400)
RBC: 5.09 MIL/uL (ref 3.87–5.11)
RDW: 12.3 % (ref 11.5–15.5)
WBC: 7.7 10*3/uL (ref 4.0–10.5)
nRBC: 0 % (ref 0.0–0.2)

## 2021-04-08 LAB — CBG MONITORING, ED: Glucose-Capillary: 152 mg/dL — ABNORMAL HIGH (ref 70–99)

## 2021-04-08 LAB — PROTIME-INR
INR: 1.1 (ref 0.8–1.2)
Prothrombin Time: 14.3 seconds (ref 11.4–15.2)

## 2021-04-08 LAB — APTT: aPTT: 25 seconds (ref 24–36)

## 2021-04-08 MED ORDER — BUTALBITAL-APAP-CAFFEINE 50-325-40 MG PO TABS
1.0000 | ORAL_TABLET | Freq: Once | ORAL | Status: AC
  Administered 2021-04-08: 1 via ORAL
  Filled 2021-04-08: qty 1

## 2021-04-08 NOTE — Discharge Instructions (Signed)
Please follow-up with your primary doctor and your neurologist regarding your speech difficulties.  If you have worsening speech difficulties, vomiting, numbness, weakness, or other new concerning symptom, come back to ER for reassessment.

## 2021-04-08 NOTE — ED Notes (Signed)
Pt sitting up eating

## 2021-04-08 NOTE — ED Provider Notes (Signed)
MRI without any significant findings.  Patient stable for discharge.   Fredia Sorrow, MD 04/08/21 [phone]

## 2021-04-08 NOTE — ED Triage Notes (Signed)
Pt here from c/o asphasia that started around 10am, hx of several strokes , speech is better now no weakness noted

## 2021-04-08 NOTE — ED Notes (Signed)
Patient transported to [facility] 

## 2021-04-08 NOTE — ED Provider Notes (Signed)
[facility] EMERGENCY DEPARTMENT Provider Note   CSN: [id number] Arrival date & time: 04/08/21  1129     History No chief complaint on file.   Crystal Day is a 86 y.o. female.  Presenting to ER with concern for speech difficulty.  Initial for aphasia starting suddenly around 10 AM.  Per further discussion with family complained of headache this morning.  Has had issues with speech previously.  No associated numbness, weakness, vision change.  Speech in the past has generally been worse in the morning and gets better throughout the day.  Headache is currently 8 of 10 in severity.  Aching.  Frontal, throbbing.  HPI     Past Medical History:  Diagnosis Date   Anginal pain ([place]) since 2014   Arthritis    just in the hands   Cancer ([place]) 1988 or 89   basal cell carcinoma on face s/p Mohs procedure   Complication of anesthesia    nausea   History of wheezing    in spring and fall   Hyperlipidemia    Hypertension    Non-obstructive CAD    a. 09/2013 Cath/NSTEMI: nonobs dzs.   Non-STEMI (non-ST elevated myocardial infarction) ([place])    a. 09/2013 - peak trop 5.38;  b. 09/2013 Cath: nonobs dzs-->Med Rx.   Pneumonia    "in my 20's"   PONV (postoperative nausea and vomiting)    Stroke [facility]) 2009   a. 09/2015 L Thalamic Lacunar infarct, presumed to be 2/2 small vessel dzs, residuals include numbness on right side of lips and right finger tips; b. 09/2015 Echo: nl LV fxn; c. 09/2015 Carotid U/S: mild bilat ICA stenosis.    Patient Active Problem List   Diagnosis Date Noted   Aphasia 11/04/2020   Memory loss 11/04/2020   Vitamin D deficiency 09/30/2020   CVA (cerebral vascular accident) ([place]) 04/26/2019   Non-STEMI (non-ST elevated myocardial infarction) ([place])    Non-obstructive CAD    Hypertension    TIA (transient ischemic attack)    Lacunar infarct, acute ([place]) 10/03/2015   CKD (chronic kidney disease) stage 3, GFR 30-59 ml/min ([place]) 10/03/2015   Acute stroke  due to ischemia ([facility])    HLD (hyperlipidemia)    OA (osteoarthritis) of knee 12/31/2014   Syncope 09/28/2014   Hyperlipidemia 04/17/2014   Atherosclerosis of native coronary artery of native heart without angina pectoris 10/05/2013   NSTEMI (non-ST elevated myocardial infarction) ([place]) 09/29/2013   Chest pain 09/28/2013   Essential hypertension 09/28/2013   Acute renal failure ([place]) 09/28/2013    Past Surgical History:  Procedure Laterality Date   ABDOMINAL HYSTERECTOMY  1974   BREAST SURGERY Left 1976   lumpectomy   EYE SURGERY     cataract surgery, both eyes   FRACTURE SURGERY Right 1971   thumb and index finger   LEFT HEART CATHETERIZATION WITH CORONARY ANGIOGRAM N/A 09/29/2013   Procedure: LEFT HEART CATHETERIZATION WITH CORONARY ANGIOGRAM;  Surgeon: Peter M Martinique, MD;  Location: [facility] CATH LAB;  Service: Cardiovascular;  Laterality: N/A;   PARTIAL KNEE ARTHROPLASTY Right 12/31/2014   Procedure: RIGHT KNEE MEDIAL UNICOMPARTMENTAL ARTHROPLASTY;  Surgeon: Gaynelle Arabian, MD;  Location: [facility] ORS;  Service: Orthopedics;  Laterality: Right;     OB History   No obstetric history on file.     Family History  Problem Relation Age of Onset   Heart attack Father 61   Cancer Mother    Stroke Brother    Stroke Paternal Grandmother  Heart attack Maternal Grandfather     Social History   Tobacco Use   Smoking status: Former    Packs/day: 0.25    Years: 5.00    Pack years: 1.25    Types: Cigarettes    Quit date: 08/17/1981    Years since quitting: 39.6   Smokeless tobacco: Never  Substance Use Topics   Alcohol use: No   Drug use: No    Home Medications Prior to Admission medications   Medication Sig Start Date End Date Taking? Authorizing Provider  clopidogrel (PLAVIX) 75 MG tablet Take 1 tablet (75 mg total) by mouth daily. After 5 days, stop aspirin 09/30/20   Sater, Nanine Means, MD  metoprolol tartrate (LOPRESSOR) 25 MG tablet Take 25 mg by mouth 2 (two) times daily.  03/28/19   [provider]  nitroGLYCERIN (NITROSTAT) 0.4 MG SL tablet Place 1 tablet (0.4 mg total) under the tongue every 5 (five) minutes as needed for chest pain. 03/11/16   Theora Gianotti, NP  Red Yeast Rice Extract (RED YEAST RICE PO) Take 1 Dose by mouth daily.    [provider]  Vitamin D, Ergocalciferol, (DRISDOL) 1.25 MG (50000 UNIT) CAPS capsule Take 1 capsule (50,000 Units total) by mouth every 7 (seven) days. Patient taking differently: Take 50,000 Units by mouth every 7 (seven) days. To take weekly x4 weeks starting 10/07/20. Then switch to 2000U/day thereafter 09/30/20   Sater, Nanine Means, MD    Allergies    Penicillins, Tetracyclines & related, Elavil [amitriptyline], Erythromycin, and Macrodantin [nitrofurantoin macrocrystal]  Review of Systems   Review of Systems  Unable to perform ROS: Acuity of condition   Physical Exam Updated Vital Signs BP (!) 171/72 (BP Location: Left Arm)   Pulse 67   Temp 98 F (36.7 C) (Oral)   Resp 17   SpO2 100%   Physical Exam Vitals and nursing note reviewed.  Constitutional:      General: She is not in acute distress.    Appearance: She is well-developed.  HENT:     Head: Normocephalic and atraumatic.  Eyes:     Conjunctiva/sclera: Conjunctivae normal.  Cardiovascular:     Rate and Rhythm: Normal rate and regular rhythm.     Heart sounds: No murmur heard. Pulmonary:     Effort: Pulmonary effort is normal. No respiratory distress.     Breath sounds: Normal breath sounds.  Abdominal:     Palpations: Abdomen is soft.     Tenderness: There is no abdominal tenderness.  Musculoskeletal:     Cervical back: Neck supple.  Skin:    General: Skin is warm and dry.  Neurological:     Mental Status: She is alert.     Comments: AAOx3 CN 2-12 intact, speech clear visual fields intact 5/5 strength in b/l UE and LE Sensation to light touch intact in b/l UE and LE Normal FNF    ED Results / Procedures /  Treatments   Labs (all labs ordered are listed, but only abnormal results are displayed) Labs Reviewed  COMPREHENSIVE METABOLIC PANEL - Abnormal; Notable for the following components:      Result Value   Glucose, Bld 178 (*)    Creatinine, Ser 1.23 (*)    GFR, Estimated 43 (*)    All other components within normal limits  I-STAT CHEM 8, ED - Abnormal; Notable for the following components:   Creatinine, Ser 1.10 (*)    Glucose, Bld 171 (*)    Calcium, Ion  1.13 (*)    All other components within normal limits  CBG MONITORING, ED - Abnormal; Notable for the following components:   Glucose-Capillary 152 (*)    All other components within normal limits  RESP PANEL BY RT-PCR (FLU A&B, COVID) ARPGX2  PROTIME-INR  APTT  CBC  DIFFERENTIAL  ETHANOL  RAPID URINE DRUG SCREEN, HOSP PERFORMED  URINALYSIS, ROUTINE W REFLEX MICROSCOPIC    EKG EKG Interpretation  Date/Time:  Tuesday April 08 2021 12:49:52 EDT Ventricular Rate:  69 PR Interval:  178 QRS Duration: 93 QT Interval:  492 QTC Calculation: 528 R Axis:   126 Text Interpretation: Right and left arm electrode reversal, interpretation assumes no reversal Sinus rhythm Biatrial enlargement Consider left ventricular hypertrophy Probable lateral infarct, age indeterminate Prolonged QT interval Confirmed by Madalyn Rob [phone]) on 04/08/2021 1:47:01 PM  Radiology CT HEAD CODE STROKE WO CONTRAST  Result Date: 04/08/2021 CLINICAL DATA:  Code stroke.  Neuro deficit, acute, stroke suspected EXAM: CT HEAD WITHOUT CONTRAST TECHNIQUE: Contiguous axial images were obtained from the base of the skull through the vertex without intravenous contrast. COMPARISON:  September 2020, correlation made with MRI from February 2022 FINDINGS: Brain: There is no acute intracranial hemorrhage, mass effect, or edema. Multiple chronic infarcts are identified including involvement of the left parietal lobe, bilateral centrum semiovale and corona radiata,  bilateral thalamus, and bilateral cerebellum. Additional patchy low-attenuation in the supratentorial white matter probably reflects chronic microvascular ischemic changes. Prominence of the ventricles and sulci reflects generalized parenchymal volume loss. No extra-axial collection. Vascular: No hyperdense vessel. There is intracranial atherosclerotic calcification at the skull base. Skull: Unremarkable. Sinuses/Orbits: Patchy paranasal sinus mucosal thickening. No significant orbital abnormality. Other: Mastoid air cells are clear. ASPECTS ([place] Stroke Program Early CT Score) - Ganglionic level infarction (caudate, lentiform nuclei, internal capsule, insula, M1-M3 cortex): 7 - Supraganglionic infarction (M4-M6 cortex): 3 Total score (0-10 with 10 being normal): 10 IMPRESSION: There is no acute intracranial hemorrhage or evidence of acute infarction. ASPECT score is 10. Multiple chronic infarcts. These results were communicated to Dr. Curly Shores at 12:18 pm on 04/08/2021 by text page via the [facility] messaging system. Electronically Signed   By: Macy Mis M.D.   On: 04/08/2021 12:22    Procedures Procedures   Medications Ordered in ED Medications  butalbital-acetaminophen-caffeine (FIORICET) 50-325-40 MG per tablet 1 tablet (has no administration in time range)    ED Course  I have reviewed the triage vital signs and the nursing notes.  Pertinent labs & imaging results that were available during my care of the patient were reviewed by me and considered in my medical decision making (see chart for details).    MDM Rules/Calculators/A&P                           86 year old lady presents to ER with concern for sudden onset of difficulty with speech.  Concern for possibility of stroke and code stroke was initiated.  Neurology evaluated patient, CT head obtained.  Patient has had long history of intermittent aphasia.  Felt to be unlikely acute stroke today.  Neurology recommending obtaining MRI  brain.  If negative, anticipate discharge home with plan for outpatient follow-up.  While awaiting MRI, signed out to Dr. Rogene Houston.  Final Clinical Impression(s) / ED Diagnoses Final diagnoses:  Transient neurologic deficit  Difficulty with speech    Rx / DC Orders ED Discharge Orders     None  Lucrezia Starch, MD 04/08/21 [phone]

## 2021-04-08 NOTE — ED Notes (Signed)
Changed pt's purewick

## 2021-04-08 NOTE — Consult Note (Addendum)
Stroke scale neurology Consultation  ATTENDING NOTE: I reviewed above note and agree with the assessment and plan. Pt was seen and examined.   86 year old female with history of hyperlipidemia, hypertension, CAD/NSTEMI in 2015, left thalamic lacunar infarct in 2017, R CR infarct 2020, small to moderate left parietal and punctate focus right anterior pons infarcts in 09/2020 with some residual aphasia presented to ED for headache and aphasia.  In 02/2016, patient admitted for left medial anterior thalamic infarct with chronic bilateral thalamic and cerebellar lacunar infarcts.  TEE and carotid Doppler unremarkable.  MRI showed right P1 stenosis.  LDL 183, A1c 5.9, patient put on Plavix and pravastatin.  In 04/2019 patient admitted for right CR/SO infarct.  CT head and neck no significant stenosis.  EF 60 to 65%.  LDL 190, A1c 5.7.  Discharged with DAPT for 3 weeks and then aspirin alone.  In 09/2020 patient followed with Dr. Felecia Shelling at [facility] for memory problems since 07/2020 and then intermittent speech change with word finding difficulties.  MRI in 08/2020 showed no acute infarct.  However repeat MRI in 09/2020 showed subacute left small to moderate sized parietal infarct as well as right anterior pontine small infarct.  She was continued on Plavix and outpatient 14-day cardiac monitoring showed no A. fib.  Since then, patient has some residual expressive aphasia especially in the p.m. and at night.  Patient lasting well went to bed last night 10 PM.  She stated that she woke up 9 AM had bad headache and speech difficulty.  Daughter called her around 9:15 AM, had very brief interaction with patient seems that she was able to answer questions with brief answers but stated that she did not feel well.  When daughter came to her house around 10 AM found patient to have a worsening expressive aphasia along with headache.  She was sent to ER for evaluation.  Initially, patient does have worsening aphasia than her  baseline, however, after CT head patient speech much improved and nearly at baseline.  She still has some headache although some improvement.  She denies any frequent headache, migraine history, arm or leg weakness.  CT head no acute abnormality.  NIH score 3 for not able to answer orientation questions and mild aphasia.  Reviewed her most recent neuro exam with Dr. Felecia Shelling in 10/2020, current neuro examination was similar.  Patient not a tPA candidate given outside window, rapid resolving symptoms and currently near baseline.  Not IR candidate given low NIH score and likely not stroke.  Recommended MRI and MRA head to rule out stroke.  Patient had carotid Doppler in 09/2020 which was unremarkable.  Will give Fioricet for headache management.  For detailed assessment and plan, please refer to above as I have made changes wherever appropriate.   Rosalin Hawking, MD PhD Stroke Neurology 04/08/2021 3:30 PM  ADDENDUM: MRI brain showed no acute abnormality, and MRA showed moderate stenosis right P1 which was also seen in MRA in 02/2016.    Etiology for patient's symptoms likely due to recrudescence of old stroke symptoms in the setting of headache, complicated migraine.  Less likely for seizure.  Recommend to continue Plavix. Discussed with EDP, if patient headache improved, patient can be discharged from ER from neuro standpoint with close follow-up with Dr. Felecia Shelling at [facility].  Rosalin Hawking, MD PhD Stroke Neurology 04/08/2021 4:45 PM   Reason for Consult: Aphasia Referring Physician: Dr. Roslynn Amble  CC: Headache, aphasia  History is obtained from: Patient, Patient's daughter via telephone,  Chart review  HPI: Millee L Scotland is a 86 y.o. female with a medical history significant for hyperlipidemia, hypertension, cognitive changes per neurology note 11/04/2020, coronary artery disease, NSTEMI s/p cardiac catheterization in 2015, multiple strokes- 2017 left thalamic lacunar infarct, moderately large subacute left  parietal and punctate focus right anterior pons infarcts identified on MRI brain 09/28/2020, and remote scattered lacunar infarctions with some residual aphasia. She presented to the ED via POV for evaluation of confusion and aphasia worse than baseline. Per patient's daughter, Ms. Venning was last spoken to in her normal state of health at 22:00 last night. When her daughter arrived at her house this morning, Ms. Papadakis complained of a headache and stated that she was freezing but she also seemed confused with word-finding difficulties. Her daughter states that she was trying to communicate but was having trouble with word-finding more than usual than deficits from her previous stroke and decided to bring her to the hospital for further evaluation.   Patient explains initially that her daughter brought her to be evaluated but that she did not notice any change in her speech from baseline. Later, she states  "I just couldn't get my words out" and explains that since January, her speech is worse in the morning and tends to improve throughout the day. She states that this morning she had a frontal, throbbing headache with an 8/10 in severity and was sick on her stomach but that she rarely experiences headaches or stomach upset and had increased speech disturbance.   LKW: 22:00 04/08/2021 tpa given?: no, patient returned to baseline without ongoing aphasia worse than baseline speech. IR Thrombectomy? No, presentation is not concerning for LVO, patient with return to baseline during assessment. Modified Rankin Scale: 1-No significant post stroke disability and can perform usual duties with stroke symptoms  ROS: A complete ROS was performed and is negative except as noted in the HPI.  Past Medical History:  Diagnosis Date   Anginal pain ([place]) since 2014   Arthritis    just in the hands   Cancer ([place]) 1988 or 89   basal cell carcinoma on face s/p Mohs procedure   Complication of anesthesia    nausea    History of wheezing    in spring and fall   Hyperlipidemia    Hypertension    Non-obstructive CAD    a. 09/2013 Cath/NSTEMI: nonobs dzs.   Non-STEMI (non-ST elevated myocardial infarction) ([place])    a. 09/2013 - peak trop 5.38;  b. 09/2013 Cath: nonobs dzs-->Med Rx.   Pneumonia    "in my 20's"   PONV (postoperative nausea and vomiting)    Stroke [facility]) 2009   a. 09/2015 L Thalamic Lacunar infarct, presumed to be 2/2 small vessel dzs, residuals include numbness on right side of lips and right finger tips; b. 09/2015 Echo: nl LV fxn; c. 09/2015 Carotid U/S: mild bilat ICA stenosis.   Past Surgical History:  Procedure Laterality Date   ABDOMINAL HYSTERECTOMY  1974   BREAST SURGERY Left 1976   lumpectomy   EYE SURGERY     cataract surgery, both eyes   FRACTURE SURGERY Right 1971   thumb and index finger   LEFT HEART CATHETERIZATION WITH CORONARY ANGIOGRAM N/A 09/29/2013   Procedure: LEFT HEART CATHETERIZATION WITH CORONARY ANGIOGRAM;  Surgeon: Peter M Martinique, MD;  Location: [facility] CATH LAB;  Service: Cardiovascular;  Laterality: N/A;   PARTIAL KNEE ARTHROPLASTY Right 12/31/2014   Procedure: RIGHT KNEE MEDIAL UNICOMPARTMENTAL ARTHROPLASTY;  Surgeon: Gaynelle Arabian,  MD;  Location: [facility] ORS;  Service: Orthopedics;  Laterality: Right;   Family History  Problem Relation Age of Onset   Heart attack Father 21   Cancer Mother    Stroke Brother    Stroke Paternal Grandmother    Heart attack Maternal Grandfather    Social History:   reports that she quit smoking about 39 years ago. She has a 1.25 pack-year smoking history. She has never used smokeless tobacco. She reports that she does not drink alcohol and does not use drugs.  Medications No current facility-administered medications for this encounter.  Current Outpatient Medications:    clopidogrel (PLAVIX) 75 MG tablet, Take 1 tablet (75 mg total) by mouth daily. After 5 days, stop aspirin, Disp: 90 tablet, Rfl: 3   metoprolol tartrate (LOPRESSOR) 25  MG tablet, Take 25 mg by mouth 2 (two) times daily., Disp: , Rfl:    nitroGLYCERIN (NITROSTAT) 0.4 MG SL tablet, Place 1 tablet (0.4 mg total) under the tongue every 5 (five) minutes as needed for chest pain., Disp: 25 tablet, Rfl: 3   Red Yeast Rice Extract (RED YEAST RICE PO), Take 1 Dose by mouth daily., Disp: , Rfl:    Vitamin D, Ergocalciferol, (DRISDOL) 1.25 MG (50000 UNIT) CAPS capsule, Take 1 capsule (50,000 Units total) by mouth every 7 (seven) days. (Patient taking differently: Take 50,000 Units by mouth every 7 (seven) days. To take weekly x4 weeks starting 10/07/20. Then switch to 2000U/day thereafter), Disp: 13 capsule, Rfl: 1  Exam: Current vital signs: BP (!) 174/85 (BP Location: Left Arm)   Pulse 80   Temp 98 F (36.7 C) (Oral)   Resp 18   SpO2 99%  Vital signs in last 24 hours: Temp:  [98 F (36.7 C)] 98 F (36.7 C) (08/23 1145) Pulse Rate:  [80] 80 (08/23 1145) Resp:  [18] 18 (08/23 1145) BP: (174)/(85) 174/85 (08/23 1145) SpO2:  [99 %] 99 % (08/23 1145)  GENERAL: Awake, alert, in no acute distress Psych: Affect appropriate for situation, patient is calm and cooperative with examination Head: Normocephalic and atraumatic, without obvious abnormality EENT: Normal conjunctivae, dry mucous membranes, no OP obstruction LUNGS: Normal respiratory effort. Non-labored breathing on room air CV: Regular rate on telemetry, no pedal edema ABDOMEN: Soft, non-tender, non-distended Extremities: warm, well perfused, without obvious deformity  NEURO:  Mental Status: Awake, alert, and oriented to person and place.  She is unable to correctly state the year, month, or her age.  She is able to provide some details regarding presenting illness but her story is variable throughout the assessment.  Speech is without dysarthria.  Initially is able to name 4/6 objects on stroke item card but on reassessment she is able to name 5/6 (consistent with previous documentation with outpatient  neurology 11/04/2020) Reading and repetition remain intact.  Speech is non-fluent with interspersed word-finding difficulties but does steadily improve on each reassessment.   No neglect is noted Cranial Nerves:  II: PERRL 3 mm/brisk. Visual fields full.  III, IV, VI: EOMI without ptosis or gaze preference V: Sensation is intact to light touch and symmetrical to face.  VII: Face is symmetric resting and smiling.  VIII: Hearing is intact to voice IX, X: Palate elevation is symmetric.  XI: Normal sternocleidomastoid and trapezius muscle strength XII: Tongue protrudes midline without fasciculations.   Motor: 5/5 strength is all muscle groups without vertical drift on assessment.  Tone is normal. Bulk is normal.  Sensation: Intact to light touch bilaterally in upper  and lower extremities though she does report on one assessment decreased sensation to bilateral lower extremities but notes that this is chronic. On reassessment she does not note decreased sensation to bilateral lower extremities. Coordination: FTN intact bilaterally. HKS intact bilaterally. No pronator drift.  DTRs: 1+ and symmetric patellae, 2+ and symmetric biceps.  Plantars: Toes downgoing bilaterally Gait: Deferred  NIHSS: 1a Level of Conscious.: 0 1b LOC Questions: 2 1c LOC Commands: 0 2 Best Gaze: 0 3 Visual: 0 4 Facial Palsy: 0 5a Motor Arm - left: 0 5b Motor Arm - Right: 0 6a Motor Leg - Left: 0 6b Motor Leg - Right: 0 7 Limb Ataxia: 0 8 Sensory: 0 9 Best Language: 1 10 Dysarthria: 0 11 Extinct. and Inatten.: 0 TOTAL: 3  Labs I have reviewed labs in epic and the results pertinent to this consultation are: CBC    Component Value Date/Time   WBC 7.7 04/08/2021 1153   RBC 5.09 04/08/2021 1153   HGB 15.0 04/08/2021 1204   HCT 44.0 04/08/2021 1204   PLT 202 04/08/2021 1153   MCV 88.6 04/08/2021 1153   MCH 28.7 04/08/2021 1153   MCHC 32.4 04/08/2021 1153   RDW 12.3 04/08/2021 1153   LYMPHSABS 0.7  04/08/2021 1153   MONOABS 0.3 04/08/2021 1153   EOSABS 0.0 04/08/2021 1153   BASOSABS 0.1 04/08/2021 1153   CMP     Component Value Date/Time   NA 137 04/08/2021 1204   K 4.0 04/08/2021 1204   CL 102 04/08/2021 1204   CO2 27 04/08/2021 1153   GLUCOSE 171 (H) 04/08/2021 1204   BUN 21 04/08/2021 1204   CREATININE 1.10 (H) 04/08/2021 1204   CALCIUM 9.3 04/08/2021 1153   PROT 6.8 04/08/2021 1153   ALBUMIN 3.7 04/08/2021 1153   AST 23 04/08/2021 1153   ALT 11 04/08/2021 1153   ALKPHOS 46 04/08/2021 1153   BILITOT 0.6 04/08/2021 1153   GFRNONAA 43 (L) 04/08/2021 1153   GFRAA 50 (L) 04/26/2019 0016   Lipid Panel     Component Value Date/Time   CHOL 279 (H) 04/26/2019 1200   TRIG 230 (H) 04/26/2019 1200   HDL 43 04/26/2019 1200   CHOLHDL 6.5 04/26/2019 1200   VLDL 46 (H) 04/26/2019 1200   LDLCALC 190 (H) 04/26/2019 1200   Lab Results  Component Value Date   HGBA1C 5.7 (H) 04/26/2019   Imaging I have reviewed the images obtained:  CT-scan of the brain 04/08/2021: There is no acute intracranial hemorrhage or evidence of acute infarction. ASPECT score is 10. Multiple chronic infarcts.  MRI examination of the brain pending  Assessment: 86 y.o. female who presented to the ED 04/08/2021 for evaluation of headache and increasing speech disturbance from baseline.  - Examination reveals patient with mild aphasia, loss of fluency, and with complaints of headache. She is able to read and repeat phrases, name 4/6 objects initially and then 5/6 objects on reassessment, and she is oriented to self, place, and somewhat to situation. Her initial NIHSS is 3. Per outpatient neurology note in March of 2022, patient is at baseline with orientation x 1 and naming 5/6 objects. Throughout her assessment, her word-finding becomes less troublesome to patient and she states that she is back to baseline.  - CTH was obtained without evidence of acute intracranial abnormality. Due to her return to  baseline, she was not considered a candidate for thrombolytic therapy. Her presentation was not felt to be concerning for LVO with baseline aphasia, no  visual deficits, and without extremity weakness.  - Presentation is most consistent with recrudescence of stroke symptoms in the setting of an acute headache, however, will obtain MRI brain to rule out further ischemia / infarct as etiology of worsening aphasia.  - Stroke risk factors include history of stroke, HTN, HLD, NSTEMI, and CAD.   Impression: Transient worsening aphasia, concern for stroke versus recrudescence of previous stroke deficits in the setting of headache  Recommendations: - MRI brain, MRA head without contrast - Stroke prophylaxis: continue clopidogrel  - Will complete full stroke work up if MRI brain with evidence of acute infarction - Fioricet x 1 for headache management - If headache resolves, no further neurologic work up recommended at this time. If headache persists or worsens, would recommend admission for observation.   Anibal Henderson, AGAC-NP [facility] Pager: [phone]

## 2021-04-08 NOTE — ED Provider Notes (Signed)
Emergency Medicine Provider Triage Evaluation Note  Crystal Day , a 86 y.o. female  was evaluated in triage.  Pt complains of aphasia that began around 10 AM this morning. Pt reports hx of multiple strokes in the past and states this feels similar. Feels like her speech is improving however still not baseline for patient. No other complaints.   Review of Systems  Positive: + difficulty word finding Negative: - weakness, facial droop  Physical Exam  BP (!) 174/85 (BP Location: Left Arm)   Pulse 80   Temp 98 F (36.7 C) (Oral)   Resp 18   SpO2 99%  Gen:   Awake, no distress   Resp:  Normal effort  MSK:   Moves extremities without difficulty  Other:  CN intact. No weakness to BUE and BLEs. No facial droop. Difficulty with word finding.    Medical Decision Making  Medically screening exam initiated at 11:52 AM.  Appropriate orders placed.  Crystal Day was informed that the remainder of the evaluation will be completed by another provider, this initial triage assessment does not replace that evaluation, and the importance of remaining in the ED until their evaluation is complete.  11:55 AM Code stroke called in triage.    Eustaquio Maize, PA-C 04/08/21 1155    Malvin Johns, MD 04/08/21 1451

## 2021-04-10 DIAGNOSIS — I6350 Cerebral infarction due to unspecified occlusion or stenosis of unspecified cerebral artery: Secondary | ICD-10-CM | POA: Diagnosis not present

## 2021-04-10 DIAGNOSIS — I10 Essential (primary) hypertension: Secondary | ICD-10-CM | POA: Diagnosis not present

## 2021-04-10 DIAGNOSIS — J4530 Mild persistent asthma, uncomplicated: Secondary | ICD-10-CM | POA: Diagnosis not present

## 2021-04-10 DIAGNOSIS — N1832 Chronic kidney disease, stage 3b: Secondary | ICD-10-CM | POA: Diagnosis not present

## 2021-04-10 DIAGNOSIS — E7800 Pure hypercholesterolemia, unspecified: Secondary | ICD-10-CM | POA: Diagnosis not present

## 2021-04-10 DIAGNOSIS — I214 Non-ST elevation (NSTEMI) myocardial infarction: Secondary | ICD-10-CM | POA: Diagnosis not present

## 2021-04-10 DIAGNOSIS — J449 Chronic obstructive pulmonary disease, unspecified: Secondary | ICD-10-CM | POA: Diagnosis not present

## 2021-04-10 DIAGNOSIS — I2510 Atherosclerotic heart disease of native coronary artery without angina pectoris: Secondary | ICD-10-CM | POA: Diagnosis not present

## 2021-04-10 DIAGNOSIS — I63212 Cerebral infarction due to unspecified occlusion or stenosis of left vertebral arteries: Secondary | ICD-10-CM | POA: Diagnosis not present

## 2021-04-10 DIAGNOSIS — I6789 Other cerebrovascular disease: Secondary | ICD-10-CM | POA: Diagnosis not present

## 2021-04-10 DIAGNOSIS — E782 Mixed hyperlipidemia: Secondary | ICD-10-CM | POA: Diagnosis not present

## 2021-04-24 DIAGNOSIS — R739 Hyperglycemia, unspecified: Secondary | ICD-10-CM | POA: Diagnosis not present

## 2021-05-20 ENCOUNTER — Ambulatory Visit: Payer: PPO | Admitting: Neurology

## 2021-05-20 ENCOUNTER — Encounter: Payer: Self-pay | Admitting: Neurology

## 2021-05-20 VITALS — BP 178/85 | HR 65 | Ht 65.0 in | Wt 141.5 lb

## 2021-05-20 DIAGNOSIS — R4701 Aphasia: Secondary | ICD-10-CM

## 2021-05-20 DIAGNOSIS — R29818 Other symptoms and signs involving the nervous system: Secondary | ICD-10-CM | POA: Diagnosis not present

## 2021-05-20 DIAGNOSIS — I63311 Cerebral infarction due to thrombosis of right middle cerebral artery: Secondary | ICD-10-CM

## 2021-05-20 NOTE — Progress Notes (Signed)
[facility]  PATIENT: Crystal Day DOB: [birth year]  REFERRING DOCTOR OR PCP: Carol Ada, MD SOURCE: Patient, daughter, lab tests, MRI results, MRI images personally reviewed  _________________________________   HISTORICAL  CHIEF COMPLAINT:  Chief Complaint  Patient presents with   Consult    Rm 2, w daughter Adela Lank. Here for hospital f/u, saw Dr. Erlinda Hong. Pt reports doing well today. Pts daughter reports "her words still don't come out right." Pt has moved to a retirement home and has a hard time participating and doing things.     HISTORY OF PRESENT ILLNESS:  Crystal Day is a 86 y.o. woman with cognitive changes found to have an acute stroke  Update 05/20/2021 She went to the ED 04/08/2021 for speech difficulty (expressive aphasia) starting 10 am that day. This was severe with very confused speech x 4 hours (daughter said nothing made sense and no sentences). --- since then she has had a persistent milder expressive aphasia.  This mild aphasia has been present since her stroke in early February.   She also had a HA earlier that day  .  By the end of the day, she was neurologically completely back to her poststroke baseline.Marland Kitchen  MRI of the brain and MRA 04/08/2021 showed No acute findings.    Multiple chronic infarcts.   Moderate right P1 PCA stenosis.   Fetal origin of left PCA.     Carotid U/S in February 2022 shows mild proximal ICA stenosis bilaterally.  Echo in 2020 was non-contribuatary.  She has no history of arrhythmia and denies palpitations.  Clinically, she is doing better with less confusion.  She still has some aphasia.  She still has difficulty with math.     Vascular risk factors:  She has hypertension and coronary artery disease but she took herself off all of her medications.     Imaging: MRI of the brain and MRA 04/08/2021 showed No acute findings.    Multiple chronic infarcts, including the one underlying the left angular gyrus that was acute  earlier in the year.   Moderate right P1 PCA stenosis.   Fetal origin of left PCA.    MRI of the brain 09/28/2020 shows Moderately large subacute stroke in the left parietal lobe underlying the supramarginal and angular gyri.  This was not present on the 08/30/2020 MRI.  MRI of the brain from Aug 30, 2020.  It shows mild generalized cortical atrophy that is most pronounced in the medial temporal lobes.  Additionally, she has lacunar infarctions in the cerebellar hemispheres and bilateral thalamus.  There are moderate chronic microvascular ischemic changes in the hemispheres.  None of the ischemic findings were acute.    MRI of the brain 10/03/2015 showed a small acute left anteromedial thalamic lacunar infarction.  Compared to the 2007 MRI, there are additional chronic ischemic findings in the cerebellum, right thalamus and hemispheres  MRI of the brain 11/21/2005 showed a small acute left thalamic infarct and a small acute right centrum semiovale infarction   [facility] Cognitive Assessment  09/24/2020  Visuospatial/ Executive (0/5) 0  Naming (0/3) 3  Attention: Read list of digits (0/2) 1  Attention: Read list of letters (0/1) 0  Attention: Serial 7 subtraction starting at 100 (0/3) 0  Language: Repeat phrase (0/2) 1  Language : Fluency (0/1) 1  Abstraction (0/2) 0  Delayed Recall (0/5) 0  Orientation (0/6) 3  Total 9  Adjusted Score (based on education) 10     REVIEW OF SYSTEMS:  Constitutional: No fevers, chills, sweats, or change in appetite Eyes: No visual changes, double vision, eye pain Ear, nose and throat: No hearing loss, ear pain, nasal congestion, sore throat Cardiovascular: No chest pain, palpitations Respiratory:  No shortness of breath at rest or with exertion.   No wheezes GastrointestinaI: No nausea, vomiting, diarrhea, abdominal pain, fecal incontinence Genitourinary:  No dysuria, urinary retention or frequency.  No nocturia. Musculoskeletal:  No neck pain, back  pain Integumentary: No rash, pruritus, skin lesions Neurological: as above Psychiatric: No depression at this time.  No anxiety Endocrine: No palpitations, diaphoresis, change in appetite, change in weigh or increased thirst Hematologic/Lymphatic:  No anemia, purpura, petechiae. Allergic/Immunologic: No itchy/runny eyes, nasal congestion, recent allergic reactions, rashes  ALLERGIES: Allergies  Allergen Reactions   Penicillins Hives   Tetracyclines & Related Nausea Only   Elavil [Amitriptyline] Rash   Erythromycin Rash   Macrodantin [Nitrofurantoin Macrocrystal] Rash    HOME MEDICATIONS:  Current Outpatient Medications:    clopidogrel (PLAVIX) 75 MG tablet, Take 1 tablet (75 mg total) by mouth daily. After 5 days, stop aspirin (Patient taking differently: Take 75 mg by mouth in the morning.), Disp: 90 tablet, Rfl: 3   losartan (COZAAR) 50 MG tablet, Take 50 mg by mouth daily., Disp: , Rfl:    metoprolol tartrate (LOPRESSOR) 25 MG tablet, Take 25 mg by mouth 2 (two) times daily., Disp: , Rfl:    nitroGLYCERIN (NITROSTAT) 0.4 MG SL tablet, Place 1 tablet (0.4 mg total) under the tongue every 5 (five) minutes as needed for chest pain., Disp: 25 tablet, Rfl: 3   Red Yeast Rice Extract (RED YEAST RICE PO), Take 1 tablet by mouth daily., Disp: , Rfl:   PAST MEDICAL HISTORY: Past Medical History:  Diagnosis Date   Anginal pain ([place]) since 2014   Arthritis    just in the hands   Cancer ([place]) 1988 or 89   basal cell carcinoma on face s/p Mohs procedure   Complication of anesthesia    nausea   History of wheezing    in spring and fall   Hyperlipidemia    Hypertension    Non-obstructive CAD    a. 09/2013 Cath/NSTEMI: nonobs dzs.   Non-STEMI (non-ST elevated myocardial infarction) ([place])    a. 09/2013 - peak trop 5.38;  b. 09/2013 Cath: nonobs dzs-->Med Rx.   Pneumonia    "in my 20's"   PONV (postoperative nausea and vomiting)    Stroke [facility]) 2009   a. 09/2015 L Thalamic Lacunar  infarct, presumed to be 2/2 small vessel dzs, residuals include numbness on right side of lips and right finger tips; b. 09/2015 Echo: nl LV fxn; c. 09/2015 Carotid U/S: mild bilat ICA stenosis.    PAST SURGICAL HISTORY: Past Surgical History:  Procedure Laterality Date   ABDOMINAL HYSTERECTOMY  1974   BREAST SURGERY Left 1976   lumpectomy   EYE SURGERY     cataract surgery, both eyes   FRACTURE SURGERY Right 1971   thumb and index finger   LEFT HEART CATHETERIZATION WITH CORONARY ANGIOGRAM N/A 09/29/2013   Procedure: LEFT HEART CATHETERIZATION WITH CORONARY ANGIOGRAM;  Surgeon: Peter M Martinique, MD;  Location: [facility] CATH LAB;  Service: Cardiovascular;  Laterality: N/A;   PARTIAL KNEE ARTHROPLASTY Right 12/31/2014   Procedure: RIGHT KNEE MEDIAL UNICOMPARTMENTAL ARTHROPLASTY;  Surgeon: Gaynelle Arabian, MD;  Location: [facility] ORS;  Service: Orthopedics;  Laterality: Right;    FAMILY HISTORY: Family History  Problem Relation Age of Onset   Heart  attack Father 59   Cancer Mother    Stroke Brother    Stroke Paternal Grandmother    Heart attack Maternal Grandfather     SOCIAL HISTORY:  Social History   Socioeconomic History   Marital status: Widowed    Spouse name: Not on file   Number of children: Not on file   Years of education: Not on file   Highest education level: Not on file  Occupational History   Not on file  Tobacco Use   Smoking status: Former    Packs/day: 0.25    Years: 5.00    Pack years: 1.25    Types: Cigarettes    Quit date: 08/17/1981    Years since quitting: 39.7   Smokeless tobacco: Never  Substance and Sexual Activity   Alcohol use: No   Drug use: No   Sexual activity: Not on file  Other Topics Concern   Not on file  Social History Narrative   ** Merged History Encounter **       Social Determinants of Health   Financial Resource Strain: Not on file  Food Insecurity: Not on file  Transportation Needs: Not on file  Physical Activity: Not on file  Stress:  Not on file  Social Connections: Not on file  Intimate Partner Violence: Not on file     PHYSICAL EXAM  Vitals:   05/20/21 1117  BP: (!) 178/85  Pulse: 65  Weight: 141 lb 8 oz (64.2 kg)  Height: 5\' 5"  (1.651 m)   REPEAT 185/100   Body mass index is 23.55 kg/m.   General: The patient is well-developed and well-nourished and in no acute distress  HEENT:  Head is [place]/AT.  Sclera are anicteric.    Neck: No carotid bruits are noted.  The neck is nontender.  Cardiovascular: The heart has a regular rate and rhythm with a normal S1 and S2. There were no murmurs, gallops or rubs.    Skin: Extremities are without rash or  edema.  Musculoskeletal:  Back is nontender  Neurologic Exam  Mental status: The patient is alert and oriented x 1 at the time of the examination.  She is unable to do simple math.  She can read and write sentences.  Named 5/6 items.   Mild confusion with left/right now.   Can do 3 step command   Cranial nerves: Extraocular movements are full. Pupils are equal, round, and reactive to light and accomodation.  Visual fields are full.  Facial symmetry is present. There is good facial sensation to soft touch bilaterally.Facial strength is normal.  Trapezius and sternocleidomastoid strength is normal. No dysarthria is noted.  Hearing is ok.    Motor:  Muscle bulk is normal.   Tone is normal. Strength is  5 / 5 in all 4 extremities.   Sensory: Sensory testing is intact to vibration sensation in all 4 extremities.  She reported reduced touch sensation in the right hand relative to the left but symmetric sensation in the legs  Coordination: Cerebellar testing reveals good finger-nose-finger and heel-to-shin bilaterally.  Gait and station: Station is normal.   Gait is slightly wide..  She can turn 180 degrees in 4 steps.. Tandem gait is reduced. Romberg is negative.   Reflexes: Deep tendon reflexes are symmetric and normal bilaterally.   Plantar responses are  flexor.    DIAGNOSTIC DATA (LABS, IMAGING, TESTING) - I reviewed patient records, labs, notes, testing and imaging myself where available.  Lab Results  Component Value  Date   WBC 7.7 04/08/2021   HGB 15.0 04/08/2021   HCT 44.0 04/08/2021   MCV 88.6 04/08/2021   PLT 202 04/08/2021      Component Value Date/Time   NA 137 04/08/2021 1204   K 4.0 04/08/2021 1204   CL 102 04/08/2021 1204   CO2 27 04/08/2021 1153   GLUCOSE 171 (H) 04/08/2021 1204   BUN 21 04/08/2021 1204   CREATININE 1.10 (H) 04/08/2021 1204   CALCIUM 9.3 04/08/2021 1153   PROT 6.8 04/08/2021 1153   ALBUMIN 3.7 04/08/2021 1153   AST 23 04/08/2021 1153   ALT 11 04/08/2021 1153   ALKPHOS 46 04/08/2021 1153   BILITOT 0.6 04/08/2021 1153   GFRNONAA 43 (L) 04/08/2021 1153   GFRAA 50 (L) 04/26/2019 0016   Lab Results  Component Value Date   CHOL 279 (H) 04/26/2019   HDL 43 04/26/2019   LDLCALC 190 (H) 04/26/2019   TRIG 230 (H) 04/26/2019   CHOLHDL 6.5 04/26/2019   Lab Results  Component Value Date   HGBA1C 5.7 (H) 04/26/2019   No results found for: VITAMINB12 Lab Results  Component Value Date   TSH 2.197 04/27/2019       ASSESSMENT AND PLAN  Cerebrovascular accident (CVA) due to thrombosis of right middle cerebral artery (HCC)  Aphasia  Transient neurologic deficit   1.  Transient neurologic changes might have represented a TIA.  There was no evidence of acute stroke.  Continue Plavix.    2.  Speech is back to baseline.  3.   Continue vitamin D. 4.   Stay active  5.  F/u in 6 months, sooner if new or worsneing symtoms.     Ravan Schlemmer A. Felecia Shelling, MD, [facility] 70/01/2375, 2:83 PM Certified in Neurology, Clinical Neurophysiology, Sleep Medicine and Neuroimaging  [facility] [address] [place], [place] [address] [phone]

## 2021-06-18 ENCOUNTER — Other Ambulatory Visit: Payer: Self-pay | Admitting: Neurology

## 2021-11-04 ENCOUNTER — Ambulatory Visit: Payer: PPO | Admitting: Neurology

## 2021-11-04 ENCOUNTER — Encounter: Payer: Self-pay | Admitting: Neurology

## 2021-11-04 VITALS — BP 165/86 | HR 73 | Ht 65.0 in | Wt 142.0 lb

## 2021-11-04 DIAGNOSIS — R482 Apraxia: Secondary | ICD-10-CM | POA: Diagnosis not present

## 2021-11-04 DIAGNOSIS — R4701 Aphasia: Secondary | ICD-10-CM | POA: Diagnosis not present

## 2021-11-04 DIAGNOSIS — R29818 Other symptoms and signs involving the nervous system: Secondary | ICD-10-CM

## 2021-11-04 DIAGNOSIS — R413 Other amnesia: Secondary | ICD-10-CM

## 2021-11-04 MED ORDER — CLOPIDOGREL BISULFATE 75 MG PO TABS: 75.0000 mg | ORAL_TABLET | Freq: Every day | ORAL | 3 refills | Status: DC

## 2021-11-04 NOTE — Progress Notes (Signed)
? ?[facility] ? ?PATIENT: Crystal Day ?DOB: 1935/07/06 ? ?REFERRING DOCTOR OR PCP: Carol Ada, MD ?SOURCE: Patient, daughter, lab tests, MRI results, MRI images personally reviewed ? ?_________________________________ ? ? ?HISTORICAL ? ?CHIEF COMPLAINT:  ?Chief Complaint  ?Patient presents with  ? Follow-up  ?  Rm 2, w daughter. Here for yearly CVA f/u. Pt reports doing well. Not hurting per pt.   ? ? ?HISTORY OF PRESENT ILLNESS:  ?Crystal Day is a 87 y.o. woman with cognitive changes found to have an acute stroke ? ?Update  11/04/2021: ?She is living in a retirement center [employer] off [address]) ? ?She continues to hve some speech issues with expressive aphasia. She does not think she is any better than the last visit and sometimes it is worse than other times.    She doe s non-cooked breakfast and lunch and ges to the shared dining hall for dinner.   She uses the microwave.    ? ?She also has confusion.    She has more trouble with names of places and does better with people's names.   She gets very tired in the evening but has troulble falling asleep.   She feels good in general and is happy.  ? ?She has trouble with math and left/right orientation sometimes but not completely ? ?She had a stroke in Jan or Feb 2022 with speech difficulty (not on January 2022 MRI and looks a couple weeks old on Feb MRI).    Also, she went to the ED 04/08/2021 for speech difficulty (expressive aphasia) starting 10 am that day. This was severe with very confused speech x 4 hours (daughter said nothing made sense and no sentences). - Repeat MRi at that time showed no avute findings  ? ?MRI of the brain and MRA 04/08/2021 showed No acute findings.    Multiple chronic infarcts.   Moderate right P1 PCA stenosis.   Fetal origin of left PCA.   ? ? ?Carotid U/S in February 2022 shows mild proximal ICA stenosis bilaterally.  Echo in 2020 was non-contribuatary.  She has no history of arrhythmia and denies  palpitations. ? ?Clinically, she is doing better with less confusion.  She still has some aphasia.  She still has difficulty with math.   ? ? ?Vascular risk factors:  She has hypertension and coronary artery disease but she took herself off all of her medications.    ? ?Imaging: ?MRI of the brain and MRA 04/08/2021 showed No acute findings.    Multiple chronic infarcts, including the one underlying the left angular gyrus that was acute earlier in the year.   Moderate right P1 PCA stenosis.   Fetal origin of left PCA.   ? ?MRI of the brain 09/28/2020 shows Moderately large subacute stroke in the left parietal lobe underlying the supramarginal and angular gyri.  This was not present on the 08/30/2020 MRI. ? ?MRI of the brain from Aug 30, 2020.  It shows mild generalized cortical atrophy that is most pronounced in the medial temporal lobes.  Additionally, she has lacunar infarctions in the cerebellar hemispheres and bilateral thalamus.  There are moderate chronic microvascular ischemic changes in the hemispheres.  None of the ischemic findings were acute.   ? ?MRI of the brain 10/03/2015 showed a small acute left anteromedial thalamic lacunar infarction.  Compared to the 2007 MRI, there are additional chronic ischemic findings in the cerebellum, right thalamus and hemispheres ? ?MRI of the brain 11/21/2005 showed a small acute left  thalamic infarct and a small acute right centrum semiovale infarction ? ? ?[facility] Cognitive Assessment  09/24/2020  ?Visuospatial/ Executive (0/5) 0  ?Naming (0/3) 3  ?Attention: Read list of digits (0/2) 1  ?Attention: Read list of letters (0/1) 0  ?Attention: Serial 7 subtraction starting at 100 (0/3) 0  ?Language: Repeat phrase (0/2) 1  ?Language : Fluency (0/1) 1  ?Abstraction (0/2) 0  ?Delayed Recall (0/5) 0  ?Orientation (0/6) 3  ?Total 9  ?Adjusted Score (based on education) 10  ? ? ? ?REVIEW OF SYSTEMS: ?Constitutional: No fevers, chills, sweats, or change in appetite ?Eyes: No visual  changes, double vision, eye pain ?Ear, nose and throat: No hearing loss, ear pain, nasal congestion, sore throat ?Cardiovascular: No chest pain, palpitations ?Respiratory:  No shortness of breath at rest or with exertion.   No wheezes ?GastrointestinaI: No nausea, vomiting, diarrhea, abdominal pain, fecal incontinence ?Genitourinary:  No dysuria, urinary retention or frequency.  No nocturia. ?Musculoskeletal:  No neck pain, back pain ?Integumentary: No rash, pruritus, skin lesions ?Neurological: as above ?Psychiatric: No depression at this time.  No anxiety ?Endocrine: No palpitations, diaphoresis, change in appetite, change in weigh or increased thirst ?Hematologic/Lymphatic:  No anemia, purpura, petechiae. ?Allergic/Immunologic: No itchy/runny eyes, nasal congestion, recent allergic reactions, rashes ? ?ALLERGIES: ?Allergies  ?Allergen Reactions  ? Penicillins Hives  ? Tetracyclines & Related Nausea Only  ? Elavil [Amitriptyline] Rash  ? Erythromycin Rash  ? Macrodantin [Nitrofurantoin Macrocrystal] Rash  ? ? ?HOME MEDICATIONS: ? ?Current Outpatient Medications:  ?  clopidogrel (PLAVIX) 75 MG tablet, Take 1 tablet (75 mg total) by mouth daily., Disp: 90 tablet, Rfl: 3 ?  losartan (COZAAR) 50 MG tablet, Take 50 mg by mouth daily., Disp: , Rfl:  ?  metoprolol tartrate (LOPRESSOR) 25 MG tablet, Take 25 mg by mouth 2 (two) times daily., Disp: , Rfl:  ?  nitroGLYCERIN (NITROSTAT) 0.4 MG SL tablet, Place 1 tablet (0.4 mg total) under the tongue every 5 (five) minutes as needed for chest pain., Disp: 25 tablet, Rfl: 3 ?  Red Yeast Rice Extract (RED YEAST RICE PO), Take 1 tablet by mouth daily., Disp: , Rfl:  ? ?PAST MEDICAL HISTORY: ?Past Medical History:  ?Diagnosis Date  ? Anginal pain ([place]) since 2014  ? Arthritis   ? just in the hands  ? Cancer ([place]) 1988 or 89  ? basal cell carcinoma on face s/p Mohs procedure  ? Complication of anesthesia   ? nausea  ? History of wheezing   ? in spring and fall  ? Hyperlipidemia    ? Hypertension   ? Non-obstructive CAD   ? a. 09/2013 Cath/NSTEMI: nonobs dzs.  ? Non-STEMI (non-ST elevated myocardial infarction) ([place])   ? a. 09/2013 - peak trop 5.38;  b. 09/2013 Cath: nonobs dzs-->Med Rx.  ? Pneumonia   ? "in my 20's"  ? PONV (postoperative nausea and vomiting)   ? Stroke [facility]) 2009  ? a. 09/2015 L Thalamic Lacunar infarct, presumed to be 2/2 small vessel dzs, residuals include numbness on right side of lips and right finger tips; b. 09/2015 Echo: nl LV fxn; c. 09/2015 Carotid U/S: mild bilat ICA stenosis.  ? ? ?PAST SURGICAL HISTORY: ?Past Surgical History:  ?Procedure Laterality Date  ? ABDOMINAL HYSTERECTOMY  1974  ? BREAST SURGERY Left 1976  ? lumpectomy  ? EYE SURGERY    ? cataract surgery, both eyes  ? FRACTURE SURGERY Right 1971  ? thumb and index finger  ? LEFT HEART  CATHETERIZATION WITH CORONARY ANGIOGRAM N/A 09/29/2013  ? Procedure: LEFT HEART CATHETERIZATION WITH CORONARY ANGIOGRAM;  Surgeon: Peter M Martinique, MD;  Location: [facility] CATH LAB;  Service: Cardiovascular;  Laterality: N/A;  ? PARTIAL KNEE ARTHROPLASTY Right 12/31/2014  ? Procedure: RIGHT KNEE MEDIAL UNICOMPARTMENTAL ARTHROPLASTY;  Surgeon: Gaynelle Arabian, MD;  Location: [facility];  Service: Orthopedics;  Laterality: Right;  ? ? ?FAMILY HISTORY: ?Family History  ?Problem Relation Age of Onset  ? Heart attack Father 16  ? Cancer Mother   ? Stroke Brother   ? Stroke Paternal Grandmother   ? Heart attack Maternal Grandfather   ? ? ?SOCIAL HISTORY: ? ?Social History  ? ?Socioeconomic History  ? Marital status: Widowed  ?  Spouse name: Not on file  ? Number of children: Not on file  ? Years of education: Not on file  ? Highest education level: Not on file  ?Occupational History  ? Not on file  ?Tobacco Use  ? Smoking status: Former  ?  Packs/day: 0.25  ?  Years: 5.00  ?  Pack years: 1.25  ?  Types: Cigarettes  ?  Quit date: 08/17/1981  ?  Years since quitting: 40.2  ? Smokeless tobacco: Never  ?Substance and Sexual Activity  ? Alcohol use: No  ?  Drug use: No  ? Sexual activity: Not on file  ?Other Topics Concern  ? Not on file  ?Social History Narrative  ? ** Merged History Encounter **  ?    ? ?Social Determinants of Health  ? ?Financial Resource Strain: No

## 2022-03-26 DIAGNOSIS — J449 Chronic obstructive pulmonary disease, unspecified: Secondary | ICD-10-CM | POA: Diagnosis not present

## 2022-03-26 DIAGNOSIS — I10 Essential (primary) hypertension: Secondary | ICD-10-CM | POA: Diagnosis not present

## 2022-03-26 DIAGNOSIS — I2510 Atherosclerotic heart disease of native coronary artery without angina pectoris: Secondary | ICD-10-CM | POA: Diagnosis not present

## 2022-03-26 DIAGNOSIS — E782 Mixed hyperlipidemia: Secondary | ICD-10-CM | POA: Diagnosis not present

## 2022-04-22 DIAGNOSIS — E782 Mixed hyperlipidemia: Secondary | ICD-10-CM | POA: Diagnosis not present

## 2022-04-22 DIAGNOSIS — Z1331 Encounter for screening for depression: Secondary | ICD-10-CM | POA: Diagnosis not present

## 2022-04-22 DIAGNOSIS — I2510 Atherosclerotic heart disease of native coronary artery without angina pectoris: Secondary | ICD-10-CM | POA: Diagnosis not present

## 2022-04-22 DIAGNOSIS — I10 Essential (primary) hypertension: Secondary | ICD-10-CM | POA: Diagnosis not present

## 2022-04-22 DIAGNOSIS — I6350 Cerebral infarction due to unspecified occlusion or stenosis of unspecified cerebral artery: Secondary | ICD-10-CM | POA: Diagnosis not present

## 2022-04-22 DIAGNOSIS — R7303 Prediabetes: Secondary | ICD-10-CM | POA: Diagnosis not present

## 2022-04-22 DIAGNOSIS — Z0000 Encounter for general adult medical examination without abnormal findings: Secondary | ICD-10-CM | POA: Diagnosis not present

## 2022-04-22 DIAGNOSIS — N1832 Chronic kidney disease, stage 3b: Secondary | ICD-10-CM | POA: Diagnosis not present

## 2022-08-30 ENCOUNTER — Emergency Department (HOSPITAL_COMMUNITY): Payer: PPO

## 2022-08-30 ENCOUNTER — Emergency Department (HOSPITAL_COMMUNITY)
Admission: EM | Admit: 2022-08-30 | Discharge: 2022-08-31 | Disposition: A | Discharge status: Home / Self Care | Payer: PPO | Attending: Emergency Medicine | Admitting: Emergency Medicine

## 2022-08-30 ENCOUNTER — Other Ambulatory Visit: Payer: Self-pay

## 2022-08-30 ENCOUNTER — Encounter (HOSPITAL_COMMUNITY): Payer: Self-pay | Admitting: *Deleted

## 2022-08-30 ENCOUNTER — Encounter (HOSPITAL_COMMUNITY): Payer: Self-pay

## 2022-08-30 ENCOUNTER — Ambulatory Visit (HOSPITAL_COMMUNITY)
Admission: EM | Admit: 2022-08-30 | Discharge: 2022-08-30 | Disposition: A | Discharge status: Home / Self Care | Payer: PPO

## 2022-08-30 DIAGNOSIS — I2510 Atherosclerotic heart disease of native coronary artery without angina pectoris: Secondary | ICD-10-CM | POA: Diagnosis not present

## 2022-08-30 DIAGNOSIS — M791 Myalgia, unspecified site: Secondary | ICD-10-CM | POA: Diagnosis present

## 2022-08-30 DIAGNOSIS — Z79899 Other long term (current) drug therapy: Secondary | ICD-10-CM | POA: Diagnosis not present

## 2022-08-30 DIAGNOSIS — U071 COVID-19: Secondary | ICD-10-CM | POA: Diagnosis not present

## 2022-08-30 DIAGNOSIS — Z8673 Personal history of transient ischemic attack (TIA), and cerebral infarction without residual deficits: Secondary | ICD-10-CM | POA: Diagnosis not present

## 2022-08-30 DIAGNOSIS — Z7902 Long term (current) use of antithrombotics/antiplatelets: Secondary | ICD-10-CM | POA: Diagnosis not present

## 2022-08-30 DIAGNOSIS — Z85828 Personal history of other malignant neoplasm of skin: Secondary | ICD-10-CM | POA: Insufficient documentation

## 2022-08-30 DIAGNOSIS — I10 Essential (primary) hypertension: Secondary | ICD-10-CM | POA: Insufficient documentation

## 2022-08-30 DIAGNOSIS — R000 Tachycardia, unspecified: Secondary | ICD-10-CM | POA: Diagnosis not present

## 2022-08-30 DIAGNOSIS — R531 Weakness: Secondary | ICD-10-CM | POA: Diagnosis not present

## 2022-08-30 LAB — COMPREHENSIVE METABOLIC PANEL
ALT: 12 U/L (ref 0–44)
AST: 23 U/L (ref 15–41)
Albumin: 4 g/dL (ref 3.5–5.0)
Alkaline Phosphatase: 55 U/L (ref 38–126)
Anion gap: 9 (ref 5–15)
BUN: 18 mg/dL (ref 8–23)
CO2: 26 mmol/L (ref 22–32)
Calcium: 9.2 mg/dL (ref 8.9–10.3)
Chloride: 103 mmol/L (ref 98–111)
Creatinine, Ser: 1.4 mg/dL — ABNORMAL HIGH (ref 0.44–1.00)
GFR, Estimated: 37 mL/min — ABNORMAL LOW (ref >60)
Glucose, Bld: 126 mg/dL — ABNORMAL HIGH (ref 70–99)
Potassium: 3.9 mmol/L (ref 3.5–5.1)
Sodium: 138 mmol/L (ref 135–145)
Total Bilirubin: 0.8 mg/dL (ref 0.3–1.2)
Total Protein: 7.1 g/dL (ref 6.5–8.1)

## 2022-08-30 LAB — CBC WITH DIFFERENTIAL/PLATELET
Abs Immature Granulocytes: 0.01 10*3/uL (ref 0.00–0.07)
Basophils Absolute: 0.1 10*3/uL (ref 0.0–0.1)
Basophils Relative: 1 %
Eosinophils Absolute: 0.1 10*3/uL (ref 0.0–0.5)
Eosinophils Relative: 2 %
HCT: 46 % (ref 36.0–46.0)
Hemoglobin: 15 g/dL (ref 12.0–15.0)
Immature Granulocytes: 0 %
Lymphocytes Relative: 9 %
Lymphs Abs: 0.6 10*3/uL — ABNORMAL LOW (ref 0.7–4.0)
MCH: 29 pg (ref 26.0–34.0)
MCHC: 32.6 g/dL (ref 30.0–36.0)
MCV: 88.8 fL (ref 80.0–100.0)
Monocytes Absolute: 0.8 10*3/uL (ref 0.1–1.0)
Monocytes Relative: 12 %
Neutro Abs: 4.9 10*3/uL (ref 1.7–7.7)
Neutrophils Relative %: 76 %
Platelets: 170 10*3/uL (ref 150–400)
RBC: 5.18 MIL/uL — ABNORMAL HIGH (ref 3.87–5.11)
RDW: 12.3 % (ref 11.5–15.5)
WBC: 6.5 10*3/uL (ref 4.0–10.5)
nRBC: 0 % (ref 0.0–0.2)

## 2022-08-30 LAB — MAGNESIUM: Magnesium: 2 mg/dL (ref 1.7–2.4)

## 2022-08-30 LAB — RESP PANEL BY RT-PCR (RSV, FLU A&B, COVID)  RVPGX2
Influenza A by PCR: NEGATIVE
Influenza B by PCR: NEGATIVE
Resp Syncytial Virus by PCR: NEGATIVE
SARS Coronavirus 2 by RT PCR: POSITIVE — AB

## 2022-08-30 LAB — TROPONIN I (HIGH SENSITIVITY)
Troponin I (High Sensitivity): 15 ng/L (ref <18)
Troponin I (High Sensitivity): 19 ng/L — ABNORMAL HIGH (ref <18)

## 2022-08-30 NOTE — ED Triage Notes (Signed)
Pt is low energy, , feels like something is in her throat  , body aches , light headed fells like she going  faint x 1day

## 2022-08-30 NOTE — ED Provider Triage Note (Signed)
Emergency Medicine Provider Triage Evaluation Note  TAYLON LOUISON , a 88 y.o. female  was evaluated in triage.  Pt complains of generalized body aches, felt somewhat dizzy, went to urgent care to be evaluated however sent here for further evaluation.  Feels like she is about to be coming down with the flu.  They noted her blood pressure to be elevated, feeling dizzy however now just feels like there is something stuck in her throat but does not recall what she last ate.  Review of Systems  Positive: Myalgias, dizzy Negative: Abdominal pain  Physical Exam  Ht '5\' 5"'$  (1.651 m)   Wt 64.4 kg   BMI 23.63 kg/m  Gen:   Awake, no distress   Resp:  Normal effort  MSK:   Moves extremities without difficulty  Other:  Facial asymmetry, no dysarthria.  Lungs are clear to auscultation.  Medical Decision Making  Medically screening exam initiated at 8:13 PM.  Appropriate orders placed.  Jeweliana L Jurczyk was informed that the remainder of the evaluation will be completed by another provider, this initial triage assessment does not replace that evaluation, and the importance of remaining in the ED until their evaluation is complete.     Janeece Fitting, PA-C 08/30/22 2026

## 2022-08-30 NOTE — ED Provider Notes (Signed)
Here with daughter Reports fatigue, lightheaded and dizzy since last night Feels like she is going to pass out  Patient also states she's never had this before, and "feels like something is wrong"  Hx HTN, has not taken meds yet today BP here is 189/73 No headache, vision changes, chest pain, shortness of breath, abdominal pain  I have redirected her to the emergency department for higher level of care. Patient and daughter understand urgent care does not have proper resources to evaluate this. Declined transport. D/C via POV   Lyna Laningham, Vernice Jefferson 08/30/22 1857

## 2022-08-30 NOTE — ED Notes (Signed)
Patient is being discharged from the Urgent Care and sent to the Emergency Department via car . Per PA Rebbca Rising, patient is in need of higher level of care due to higher level of care. Patient is aware and verbalizes understanding of plan of care.  Vitals:   08/30/22 1812  BP: (!) 189/73  Pulse: (!) 104  Resp: 18  Temp: 98.1 F (36.7 C)  SpO2: 95%

## 2022-08-30 NOTE — ED Triage Notes (Signed)
Pt called daughter today and stated she feels sick.  States body aches, sore throat, low energy and dizziness.  Per daughter, pt has aphasia and memory loss from previous stroke.

## 2022-08-31 MED ORDER — PAXLOVID (150/100) 10 X 150 MG & 10 X 100MG PO TBPK: 2.0000 | ORAL_TABLET | Freq: Two times a day (BID) | ORAL | 0 refills | Status: AC

## 2022-08-31 NOTE — Discharge Instructions (Addendum)
Be sure to take all of your blood pressure medicines Please hold your red yeast for the next 5 days while taking this COVID medicine

## 2022-08-31 NOTE — ED Provider Notes (Signed)
[facility] EMERGENCY DEPARTMENT Provider Note   CSN: [id number] Arrival date & time: 08/30/22  1902     History  Chief Complaint  Patient presents with   Generalized Body Aches    Crystal Day is a 88 y.o. female.  The history is provided by the patient and a relative.   Patient with history of CAD, hypertension presents with flulike illness.  For the past 2 days she has felt feverish, intermittent dizziness, sore throat and bodyaches.  She has had mild cough.  No vomiting. She reports mildly short of breath.  No active chest pain is reported  Patient also reports fatigue. Patient lives independently in an apartment Patient reports she is ambulatory without difficulty Past Medical History:  Diagnosis Date   Anginal pain ([place]) since 2014   Arthritis    just in the hands   Cancer ([place]) 1988 or 89   basal cell carcinoma on face s/p Mohs procedure   Complication of anesthesia    nausea   History of wheezing    in spring and fall   Hyperlipidemia    Hypertension    Non-obstructive CAD    a. 09/2013 Cath/NSTEMI: nonobs dzs.   Non-STEMI (non-ST elevated myocardial infarction) ([place])    a. 09/2013 - peak trop 5.38;  b. 09/2013 Cath: nonobs dzs-->Med Rx.   Pneumonia    "in my 20's"   PONV (postoperative nausea and vomiting)    Stroke [facility]) 2009   a. 09/2015 L Thalamic Lacunar infarct, presumed to be 2/2 small vessel dzs, residuals include numbness on right side of lips and right finger tips; b. 09/2015 Echo: nl LV fxn; c. 09/2015 Carotid U/S: mild bilat ICA stenosis.    Home Medications Prior to Admission medications   Medication Sig Start Date End Date Taking? Authorizing Provider  clopidogrel (PLAVIX) 75 MG tablet Take 1 tablet (75 mg total) by mouth daily. 11/04/21   Sater, Nanine Means, MD  losartan (COZAAR) 50 MG tablet Take 50 mg by mouth daily. 03/27/21   [provider]  metoprolol tartrate (LOPRESSOR) 25 MG tablet Take 25 mg by mouth 2 (two)  times daily. 03/28/19   [provider]  nirmatrelvir & ritonavir (PAXLOVID, 150/100,) 10 x 150 MG & 10 x '100MG'$  TBPK Take 2 tablets by mouth 2 (two) times daily for 5 days. 08/31/22 09/05/22 Yes Ripley Fraise, MD  nitroGLYCERIN (NITROSTAT) 0.4 MG SL tablet Place 1 tablet (0.4 mg total) under the tongue every 5 (five) minutes as needed for chest pain. 03/11/16   Theora Gianotti, NP  Red Yeast Rice Extract (RED YEAST RICE PO) Take 1 tablet by mouth daily.    [provider]      Allergies    Penicillins, Tetracyclines & related, Elavil [amitriptyline], Erythromycin, and Macrodantin [nitrofurantoin macrocrystal]    Review of Systems   Review of Systems  Constitutional:  Positive for fatigue.  HENT:  Positive for sore throat.     Physical Exam Updated Vital Signs BP (!) 204/86   Pulse 99   Temp 98.1 F (36.7 C) (Oral)   Resp 20   Ht 1.651 m ('5\' 5"'$ )   Wt 64.4 kg   SpO2 97%   BMI 23.63 kg/m  Physical Exam CONSTITUTIONAL: Elderly but no acute distress, appears very active HEAD: Normocephalic/atraumatic EYES: EOMI/PERRL ENMT: Mucous membranes moist, no stridor or dysphonia NECK: supple no meningeal signs CV: S1/S2 noted, no murmurs/rubs/gallops noted LUNGS: Lungs are clear to auscultation bilaterally, no apparent  distress ABDOMEN: soft NEURO: Pt is awake/alert/appropriate, moves all extremitiesx4.  No facial droop.   EXTREMITIES: full ROM SKIN: warm, color normal PSYCH: no abnormalities of mood noted, alert and oriented to situation  ED Results / Procedures / Treatments   Labs (all labs ordered are listed, but only abnormal results are displayed) Labs Reviewed  RESP PANEL BY RT-PCR (RSV, FLU A&B, COVID)  [id number] - Abnormal; Notable for the following components:      Result Value   SARS Coronavirus 2 by RT PCR POSITIVE (*)    All other components within normal limits  CBC WITH DIFFERENTIAL/PLATELET - Abnormal; Notable for the following components:    RBC 5.18 (*)    Lymphs Abs 0.6 (*)    All other components within normal limits  COMPREHENSIVE METABOLIC PANEL - Abnormal; Notable for the following components:   Glucose, Bld 126 (*)    Creatinine, Ser 1.40 (*)    GFR, Estimated 37 (*)    All other components within normal limits  TROPONIN I (HIGH SENSITIVITY) - Abnormal; Notable for the following components:   Troponin I (High Sensitivity) 19 (*)    All other components within normal limits  MAGNESIUM  TROPONIN I (HIGH SENSITIVITY)    EKG EKG Interpretation  Date/Time:  Sunday August 30 2022 19:51:06 EST Ventricular Rate:  103 PR Interval:  154 QRS Duration: 84 QT Interval:  350 QTC Calculation: 458 R Axis:   71 Text Interpretation: Sinus tachycardia Biatrial enlargement Nonspecific ST abnormality Abnormal ECG No significant change since last tracing Confirmed by Ripley Fraise [phone]) on 08/31/2022 3:08:49 AM  Radiology DG Chest Portable 1 View  Result Date: 08/30/2022 CLINICAL DATA:  Weakness EXAM: PORTABLE CHEST 1 VIEW COMPARISON:  10/27/2018 FINDINGS: Cardiac shadows within normal limits. Aortic calcifications are noted. The lungs are hyperinflated but clear. No acute bony abnormality is noted. IMPRESSION: Hyperinflation without acute abnormality. Electronically Signed   By: Inez Catalina M.D.   On: 08/30/2022 20:21    Procedures Procedures    Medications Ordered in ED Medications - No data to display  ED Course/ Medical Decision Making/ A&P Clinical Course as of 08/31/22 0355  Mon Aug 31, 2022  0309 SARS Coronavirus 2 by RT PCR(!): POSITIVE Patient found to have COVID-19 [DW]    Clinical Course User Index [DW] Ripley Fraise, MD                             Medical Decision Making Amount and/or Complexity of Data Reviewed Labs:  Decision-making details documented in ED Course.  Risk Prescription drug management.   Patient presents with fatigue, myalgias, flulike illness.  She was found to be positive  for COVID-19.  I personally visualized the chest x-ray is negative. She has no hypoxia, no added lung sounds.  She is in no acute distress. She does have elevated blood pressure, but this is likely due to fat she has been in the ER for several hours and likely needs her medications. She has no signs of hypertensive emergency Patient is safe for outpatient management.  She has been vaccinated previously. She would like to start Paxlovid I reviewed her med list, advised her to hold the red yeast rice until completing the course.  She has been given renal dosing of Paxlovid Troponin is only mildly elevated, unlikely of any significance as she has no chest pain and no signs of ACS       Final Clinical Impression(s) /  ED Diagnoses Final diagnoses:  COVID-19  Primary hypertension    Rx / DC Orders ED Discharge Orders          Ordered    nirmatrelvir & ritonavir (PAXLOVID, 150/100,) 10 x 150 MG & 10 x '100MG'$  TBPK  2 times daily        08/31/22 0331              Ripley Fraise, MD 08/31/22 [phone]

## 2022-09-11 DIAGNOSIS — R0981 Nasal congestion: Secondary | ICD-10-CM | POA: Diagnosis not present

## 2022-09-11 DIAGNOSIS — U071 COVID-19: Secondary | ICD-10-CM | POA: Diagnosis not present

## 2022-09-11 DIAGNOSIS — Z8616 Personal history of COVID-19: Secondary | ICD-10-CM | POA: Diagnosis not present

## 2022-09-11 DIAGNOSIS — J069 Acute upper respiratory infection, unspecified: Secondary | ICD-10-CM | POA: Diagnosis not present

## 2022-09-11 DIAGNOSIS — R52 Pain, unspecified: Secondary | ICD-10-CM | POA: Diagnosis not present

## 2022-11-05 ENCOUNTER — Ambulatory Visit: Payer: PPO | Admitting: Neurology

## 2022-12-08 DIAGNOSIS — Z7902 Long term (current) use of antithrombotics/antiplatelets: Secondary | ICD-10-CM | POA: Diagnosis not present

## 2022-12-08 DIAGNOSIS — I10 Essential (primary) hypertension: Secondary | ICD-10-CM | POA: Diagnosis not present

## 2022-12-08 DIAGNOSIS — I679 Cerebrovascular disease, unspecified: Secondary | ICD-10-CM | POA: Diagnosis not present

## 2022-12-08 DIAGNOSIS — I2510 Atherosclerotic heart disease of native coronary artery without angina pectoris: Secondary | ICD-10-CM | POA: Diagnosis not present

## 2022-12-08 DIAGNOSIS — E782 Mixed hyperlipidemia: Secondary | ICD-10-CM | POA: Diagnosis not present

## 2022-12-15 DIAGNOSIS — R0602 Shortness of breath: Secondary | ICD-10-CM | POA: Diagnosis not present

## 2022-12-15 DIAGNOSIS — I10 Essential (primary) hypertension: Secondary | ICD-10-CM | POA: Diagnosis not present

## 2022-12-15 DIAGNOSIS — F439 Reaction to severe stress, unspecified: Secondary | ICD-10-CM | POA: Diagnosis not present

## 2023-01-21 DIAGNOSIS — L821 Other seborrheic keratosis: Secondary | ICD-10-CM | POA: Diagnosis not present

## 2023-01-21 DIAGNOSIS — D492 Neoplasm of unspecified behavior of bone, soft tissue, and skin: Secondary | ICD-10-CM | POA: Diagnosis not present

## 2023-01-21 DIAGNOSIS — C44311 Basal cell carcinoma of skin of nose: Secondary | ICD-10-CM | POA: Diagnosis not present

## 2023-02-09 DIAGNOSIS — D225 Melanocytic nevi of trunk: Secondary | ICD-10-CM | POA: Diagnosis not present

## 2023-02-09 DIAGNOSIS — L821 Other seborrheic keratosis: Secondary | ICD-10-CM | POA: Diagnosis not present

## 2023-02-09 DIAGNOSIS — L814 Other melanin hyperpigmentation: Secondary | ICD-10-CM | POA: Diagnosis not present

## 2023-02-09 DIAGNOSIS — Z7189 Other specified counseling: Secondary | ICD-10-CM | POA: Diagnosis not present

## 2023-02-09 DIAGNOSIS — C44629 Squamous cell carcinoma of skin of left upper limb, including shoulder: Secondary | ICD-10-CM | POA: Diagnosis not present

## 2023-02-09 DIAGNOSIS — C44529 Squamous cell carcinoma of skin of other part of trunk: Secondary | ICD-10-CM | POA: Diagnosis not present

## 2023-02-09 DIAGNOSIS — D492 Neoplasm of unspecified behavior of bone, soft tissue, and skin: Secondary | ICD-10-CM | POA: Diagnosis not present

## 2023-02-09 DIAGNOSIS — C44311 Basal cell carcinoma of skin of nose: Secondary | ICD-10-CM | POA: Diagnosis not present

## 2023-02-11 DIAGNOSIS — F419 Anxiety disorder, unspecified: Secondary | ICD-10-CM | POA: Diagnosis not present

## 2023-02-15 HISTORY — PX: OTHER SURGICAL HISTORY: SHX169

## 2023-03-03 DIAGNOSIS — C44311 Basal cell carcinoma of skin of nose: Secondary | ICD-10-CM | POA: Diagnosis not present

## 2023-03-16 DIAGNOSIS — D045 Carcinoma in situ of skin of trunk: Secondary | ICD-10-CM | POA: Diagnosis not present

## 2023-03-16 DIAGNOSIS — D0462 Carcinoma in situ of skin of left upper limb, including shoulder: Secondary | ICD-10-CM | POA: Diagnosis not present

## 2023-04-15 ENCOUNTER — Encounter: Payer: Self-pay | Admitting: Neurology

## 2023-04-15 ENCOUNTER — Ambulatory Visit: Payer: PPO | Admitting: Neurology

## 2023-04-15 VITALS — BP 138/76 | HR 75 | Ht 68.0 in | Wt 138.0 lb

## 2023-04-15 DIAGNOSIS — I63311 Cerebral infarction due to thrombosis of right middle cerebral artery: Secondary | ICD-10-CM | POA: Diagnosis not present

## 2023-04-15 DIAGNOSIS — I10 Essential (primary) hypertension: Secondary | ICD-10-CM

## 2023-04-15 DIAGNOSIS — R413 Other amnesia: Secondary | ICD-10-CM | POA: Diagnosis not present

## 2023-04-15 DIAGNOSIS — F01A0 Vascular dementia, mild, without behavioral disturbance, psychotic disturbance, mood disturbance, and anxiety: Secondary | ICD-10-CM | POA: Diagnosis not present

## 2023-04-15 DIAGNOSIS — R4701 Aphasia: Secondary | ICD-10-CM

## 2023-04-15 DIAGNOSIS — R482 Apraxia: Secondary | ICD-10-CM | POA: Diagnosis not present

## 2023-04-15 MED ORDER — CLOPIDOGREL BISULFATE 75 MG PO TABS: 75.0000 mg | ORAL_TABLET | Freq: Every day | ORAL | 4 refills | Status: AC

## 2023-04-15 NOTE — Progress Notes (Signed)
[facility]  PATIENT: Crystal Day DOB: 1934-12-29  REFERRING DOCTOR OR PCP: Merri Brunette, MD SOURCE: Patient, daughter, lab tests, MRI results, MRI images personally reviewed  _________________________________   HISTORICAL  CHIEF COMPLAINT:  Chief Complaint  Patient presents with   Room 11    Pt is here with her Daughter and great grand kids. Pt's daughter states that pt's memory hasn't changed.     HISTORY OF PRESENT ILLNESS:  Crystal Day is a 88 y.o. woman with cognitive changes found to have an acute stroke  Update  04/15/23: She is living in a retirement center ([facility] [address]) - she could not recal lthe name  She has memory difficulty and also continues to have some speech issues with expressive aphasia. She often does not speak when more than just a couple people because she makes so many errors.     She has more trouble with names of places and does better with people's names.     She does occasional simple chores   She uses the microwave but no cooking   She also has confusion.    She has trouble falling asleep at night but naps during the day  She feels good in general.  She gets angry quickly which never happened before the stroke.     She has trouble with math and left/right orientation sometimes but not completely  She had a stroke in Jan or Feb 2022 with speech difficulty (not on January 2022 MRI and looks a couple weeks old on Feb MRI).    Also, she went to the ED 04/08/2021 for speech difficulty (expressive aphasia) starting 10 am that day. This was severe with very confused speech x 4 hours (daughter said nothing made sense and no sentences). - Repeat MRi at that time showed no avute findings   MRI of the brain and MRA 04/08/2021 showed No acute findings.    Multiple chronic infarcts.   Moderate right P1 PCA stenosis.   Fetal origin of left PCA.     Carotid U/S in February 2022 shows mild proximal ICA stenosis bilaterally.  Echo in  2020 was non-contribuatary.  She has no history of arrhythmia and denies palpitations.  Clinically, she is doing better with less confusion.  She still has some aphasia.  She still has difficulty with math.     Vascular risk factors:  She has hypertension and coronary artery disease but she took herself off all of her medications.     Imaging: MRI of the brain and MRA 04/08/2021 showed No acute findings.    Multiple chronic infarcts, including the one underlying the left angular gyrus that was acute earlier in the year.   Moderate right P1 PCA stenosis.   Fetal origin of left PCA.    MRI of the brain 09/28/2020 shows Moderately large subacute stroke in the left parietal lobe underlying the supramarginal and angular gyri.  This was not present on the 08/30/2020 MRI.  MRI of the brain from Aug 30, 2020.  It shows mild generalized cortical atrophy that is most pronounced in the medial temporal lobes.  Additionally, she has lacunar infarctions in the cerebellar hemispheres and bilateral thalamus.  There are moderate chronic microvascular ischemic changes in the hemispheres.  None of the ischemic findings were acute.    MRI of the brain 10/03/2015 showed a small acute left anteromedial thalamic lacunar infarction.  Compared to the 2007 MRI, there are additional chronic ischemic findings in the cerebellum, right  thalamus and hemispheres  MRI of the brain 11/21/2005 showed a small acute left thalamic infarct and a small acute right centrum semiovale infarction      04/15/2023    3:58 PM 09/24/2020   11:20 AM  Montreal Cognitive Assessment   Visuospatial/ Executive (0/5) 3 0  Naming (0/3) 2 3  Attention: Read list of digits (0/2) 2 1  Attention: Read list of letters (0/1) 0 0  Attention: Serial 7 subtraction starting at 100 (0/3) 0 0  Language: Repeat phrase (0/2) 1 1  Language : Fluency (0/1) 0 1  Abstraction (0/2) 1 0  Delayed Recall (0/5) 0 0  Orientation (0/6) 3 3  Total 12 9  Adjusted Score  (based on education) 13 10     REVIEW OF SYSTEMS: Constitutional: No fevers, chills, sweats, or change in appetite Eyes: No visual changes, double vision, eye pain Ear, nose and throat: No hearing loss, ear pain, nasal congestion, sore throat Cardiovascular: No chest pain, palpitations Respiratory:  No shortness of breath at rest or with exertion.   No wheezes GastrointestinaI: No nausea, vomiting, diarrhea, abdominal pain, fecal incontinence Genitourinary:  No dysuria, urinary retention or frequency.  No nocturia. Musculoskeletal:  No neck pain, back pain Integumentary: No rash, pruritus, skin lesions Neurological: as above Psychiatric: No depression at this time.  No anxiety Endocrine: No palpitations, diaphoresis, change in appetite, change in weigh or increased thirst Hematologic/Lymphatic:  No anemia, purpura, petechiae. Allergic/Immunologic: No itchy/runny eyes, nasal congestion, recent allergic reactions, rashes  ALLERGIES: Allergies  Allergen Reactions   Penicillins Hives   Tetracyclines & Related Nausea Only   Elavil [Amitriptyline] Rash   Erythromycin Rash   Macrodantin [Nitrofurantoin Macrocrystal] Rash    HOME MEDICATIONS:  Current Outpatient Medications:    escitalopram (LEXAPRO) 5 MG tablet, Take 5 mg by mouth daily., Disp: , Rfl:    losartan (COZAAR) 50 MG tablet, Take 50 mg by mouth daily., Disp: , Rfl:    metoprolol tartrate (LOPRESSOR) 25 MG tablet, Take 25 mg by mouth 2 (two) times daily. Extended Release, Disp: , Rfl:    clopidogrel (PLAVIX) 75 MG tablet, Take 1 tablet (75 mg total) by mouth daily., Disp: 90 tablet, Rfl: 4   nitroGLYCERIN (NITROSTAT) 0.4 MG SL tablet, Place 1 tablet (0.4 mg total) under the tongue every 5 (five) minutes as needed for chest pain. (Patient not taking: Reported on 04/15/2023), Disp: 25 tablet, Rfl: 3   Red Yeast Rice Extract (RED YEAST RICE PO), Take 1 tablet by mouth daily. (Patient not taking: Reported on 04/15/2023), Disp: ,  Rfl:   PAST MEDICAL HISTORY: Past Medical History:  Diagnosis Date   Anginal pain ([facility]) since 2014   Arthritis    just in the hands   Cancer ([facility]) 1988 or 89   basal cell carcinoma on face s/p Mohs procedure   Complication of anesthesia    nausea   History of wheezing    in spring and fall   Hyperlipidemia    Hypertension    Non-obstructive CAD    a. 09/2013 Cath/NSTEMI: nonobs dzs.   Non-STEMI (non-ST elevated myocardial infarction) ([facility])    a. 09/2013 - peak trop 5.38;  b. 09/2013 Cath: nonobs dzs-->Med Rx.   Pneumonia    "in my 20's"   PONV (postoperative nausea and vomiting)    Stroke [facility]) 2009   a. 09/2015 L Thalamic Lacunar infarct, presumed to be 2/2 small vessel dzs, residuals include numbness on right side of lips and right finger  tips; b. 09/2015 Echo: nl LV fxn; c. 09/2015 Carotid U/S: mild bilat ICA stenosis.    PAST SURGICAL HISTORY: Past Surgical History:  Procedure Laterality Date   ABDOMINAL HYSTERECTOMY  08/17/1972   BREAST SURGERY Left 08/17/1974   lumpectomy   EYE SURGERY     cataract surgery, both eyes   FRACTURE SURGERY Right 08/17/1969   thumb and index finger   LEFT HEART CATHETERIZATION WITH CORONARY ANGIOGRAM N/A 09/29/2013   Procedure: LEFT HEART CATHETERIZATION WITH CORONARY ANGIOGRAM;  Surgeon: Peter M Swaziland, MD;  Location: [facility] CATH LAB;  Service: Cardiovascular;  Laterality: N/A;   PARTIAL KNEE ARTHROPLASTY Right 12/31/2014   Procedure: RIGHT KNEE MEDIAL UNICOMPARTMENTAL ARTHROPLASTY;  Surgeon: Ollen Gross, MD;  Location: [facility] ORS;  Service: Orthopedics;  Laterality: Right;   skin cancer removal  02/2023    FAMILY HISTORY: Family History  Problem Relation Age of Onset   Heart attack Father 67   Cancer Mother    Stroke Brother    Stroke Paternal Grandmother    Heart attack Maternal Grandfather     SOCIAL HISTORY:  Social History   Socioeconomic History   Marital status: Widowed    Spouse name: Not on file   Number of children: Not on  file   Years of education: Not on file   Highest education level: Not on file  Occupational History   Not on file  Tobacco Use   Smoking status: Former    Current packs/day: 0.00    Average packs/day: 0.3 packs/day for 5.0 years (1.3 ttl pk-yrs)    Types: Cigarettes    Start date: 08/17/1976    Quit date: 08/17/1981    Years since quitting: 41.6   Smokeless tobacco: Never  Substance and Sexual Activity   Alcohol use: No   Drug use: No   Sexual activity: Not on file  Other Topics Concern   Not on file  Social History Narrative   ** Merged History Encounter **       Social Determinants of Health   Financial Resource Strain: Not on file  Food Insecurity: Not on file  Transportation Needs: Not on file  Physical Activity: Not on file  Stress: Not on file  Social Connections: Not on file  Intimate Partner Violence: Not on file     PHYSICAL EXAM  Vitals:   04/15/23 1556  BP: 138/76  Pulse: 75  Weight: 138 lb (62.6 kg)  Height: 5\' 8"  (1.727 m)   REPEAT 185/100   Body mass index is 20.98 kg/m.   General: The patient is well-developed and well-nourished and in no acute distress  HEENT:  Head is [place]/AT.  Sclera are anicteric.    Neck: No carotid bruits are noted.  The neck is nontender.  Cardiovascular: The heart has a regular rate and rhythm with a normal S1 and S2. There were no murmurs, gallops or rubs.    Skin: Extremities are without rash or  edema.  Musculoskeletal:  Back is nontender  Neurologic Exam  Mental status: The patient is alert and oriented x 1 at the time of the examination.  See the [facility] cognitive assessment results above.  Additional testing: She can read and write sentences.  Mild to moderate expressive aphasia during conversation..  She named most of the items I pointed to around the room but not all.   Mild confusion with left/right and identifying fingers.     Can do 3 step command   Cranial nerves: Extraocular movements are full. Pupils  are equal, round, and reactive to light and accomodation.  Visual fields are full.  Facial symmetry is present. There is good facial sensation to soft touch bilaterally.Facial strength is normal.  Trapezius and sternocleidomastoid strength is normal. No dysarthria is noted.  Hearing is ok.    Motor:  Muscle bulk is normal.   Tone is normal. Strength is  5 / 5 in all 4 extremities.   Coordination: Cerebellar testing reveals good finger-nose-finger and heel-to-shin bilaterally.  Gait and station: Station is normal.   Gait is slightly wide..  Her turn was steady.  Tandem gait is reduced but normal for age. Romberg is negative.   Reflexes: Deep tendon reflexes are symmetric and normal bilaterally.      DIAGNOSTIC DATA (LABS, IMAGING, TESTING) - I reviewed patient records, labs, notes, testing and imaging myself where available.  Lab Results  Component Value Date   WBC 6.5 08/30/2022   HGB 15.0 08/30/2022   HCT 46.0 08/30/2022   MCV 88.8 08/30/2022   PLT 170 08/30/2022      Component Value Date/Time   NA 138 08/30/2022 2022   K 3.9 08/30/2022 2022   CL 103 08/30/2022 2022   CO2 26 08/30/2022 2022   GLUCOSE 126 (H) 08/30/2022 2022   BUN 18 08/30/2022 2022   CREATININE 1.40 (H) 08/30/2022 2022   CALCIUM 9.2 08/30/2022 2022   PROT 7.1 08/30/2022 2022   ALBUMIN 4.0 08/30/2022 2022   AST 23 08/30/2022 2022   ALT 12 08/30/2022 2022   ALKPHOS 55 08/30/2022 2022   BILITOT 0.8 08/30/2022 2022   GFRNONAA 37 (L) 08/30/2022 2022   GFRAA 50 (L) 04/26/2019 0016   Lab Results  Component Value Date   CHOL 279 (H) 04/26/2019   HDL 43 04/26/2019   LDLCALC 190 (H) 04/26/2019   TRIG 230 (H) 04/26/2019   CHOLHDL 6.5 04/26/2019   Lab Results  Component Value Date   HGBA1C 5.7 (H) 04/26/2019   No results found for: "VITAMINB12" Lab Results  Component Value Date   TSH 2.197 04/27/2019       ASSESSMENT AND PLAN  Cerebrovascular accident (CVA) due to thrombosis of right middle  cerebral artery (HCC)  Mild vascular dementia without behavioral disturbance, psychotic disturbance, mood disturbance, or anxiety (HCC)  Aphasia  Memory loss  Apraxia  Essential hypertension   1.  She appears to be mostly stable compared to 2022.  She has persistent aphasia, reduced executive function, reduced memory and other cognitive impairments but no new cognitive findings.  Her symptoms most likely due to vascular dementia from the strokes.  We will continue Plavix and she should continue blood pressure medication.      2.  If speech therapy is at the level at her senior apartment, adjacent to assisted living/SNF, then we could request this for her.  The daughter will look into this.   3.   Continue vitamin D. 4.   Stay physically and cognitively active  5.  If her PCP is willing to write the Plavix, then we can just make the follow-up as needed.  I did not schedule a follow-up but could see her if there are new or worsening symptoms.    42-minute office visit with the majority of the time spent face-to-face for history and physical, discussion/counseling and decision-making.  Additional time with cognitive testing, record review and documentation.  This visit is part of a comprehensive longitudinal care medical relationship regarding the patients primary diagnosis of aphasia/stroke and related concerns.  Yulissa Needham A. Epimenio Foot, MD, [facility] 04/15/2023, 5:41 PM Certified in Neurology, Clinical Neurophysiology, Sleep Medicine and Neuroimaging  [facility] [address] [place], [place] [address] [phone]

## 2023-05-05 DIAGNOSIS — J449 Chronic obstructive pulmonary disease, unspecified: Secondary | ICD-10-CM | POA: Diagnosis not present

## 2023-05-05 DIAGNOSIS — E782 Mixed hyperlipidemia: Secondary | ICD-10-CM | POA: Diagnosis not present

## 2023-05-05 DIAGNOSIS — R7303 Prediabetes: Secondary | ICD-10-CM | POA: Diagnosis not present

## 2023-05-05 DIAGNOSIS — I2510 Atherosclerotic heart disease of native coronary artery without angina pectoris: Secondary | ICD-10-CM | POA: Diagnosis not present

## 2023-05-05 DIAGNOSIS — I679 Cerebrovascular disease, unspecified: Secondary | ICD-10-CM | POA: Diagnosis not present

## 2023-05-05 DIAGNOSIS — I10 Essential (primary) hypertension: Secondary | ICD-10-CM | POA: Diagnosis not present

## 2023-05-05 DIAGNOSIS — F01B0 Vascular dementia, moderate, without behavioral disturbance, psychotic disturbance, mood disturbance, and anxiety: Secondary | ICD-10-CM | POA: Diagnosis not present

## 2023-05-05 DIAGNOSIS — Z0000 Encounter for general adult medical examination without abnormal findings: Secondary | ICD-10-CM | POA: Diagnosis not present

## 2023-05-05 DIAGNOSIS — F419 Anxiety disorder, unspecified: Secondary | ICD-10-CM | POA: Diagnosis not present

## 2023-05-05 DIAGNOSIS — N1832 Chronic kidney disease, stage 3b: Secondary | ICD-10-CM | POA: Diagnosis not present

## 2023-05-05 DIAGNOSIS — Z1331 Encounter for screening for depression: Secondary | ICD-10-CM | POA: Diagnosis not present

## 2023-12-13 DIAGNOSIS — J209 Acute bronchitis, unspecified: Secondary | ICD-10-CM | POA: Diagnosis not present

## 2023-12-13 DIAGNOSIS — J329 Chronic sinusitis, unspecified: Secondary | ICD-10-CM | POA: Diagnosis not present

## 2023-12-13 DIAGNOSIS — R058 Other specified cough: Secondary | ICD-10-CM | POA: Diagnosis not present

## 2024-01-09 ENCOUNTER — Inpatient Hospital Stay (HOSPITAL_COMMUNITY)
Admission: EM | Admit: 2024-01-09 | Discharge: 2024-01-14 | DRG: 535 | Disposition: A | Discharge status: Skilled Nursing Facility | Attending: Internal Medicine | Admitting: Internal Medicine

## 2024-01-09 ENCOUNTER — Other Ambulatory Visit: Payer: Self-pay

## 2024-01-09 ENCOUNTER — Emergency Department (HOSPITAL_COMMUNITY)

## 2024-01-09 ENCOUNTER — Encounter (HOSPITAL_COMMUNITY): Payer: Self-pay

## 2024-01-09 DIAGNOSIS — Z7902 Long term (current) use of antithrombotics/antiplatelets: Secondary | ICD-10-CM

## 2024-01-09 DIAGNOSIS — I69351 Hemiplegia and hemiparesis following cerebral infarction affecting right dominant side: Secondary | ICD-10-CM | POA: Diagnosis not present

## 2024-01-09 DIAGNOSIS — E785 Hyperlipidemia, unspecified: Secondary | ICD-10-CM | POA: Diagnosis present

## 2024-01-09 DIAGNOSIS — Z87891 Personal history of nicotine dependence: Secondary | ICD-10-CM | POA: Diagnosis not present

## 2024-01-09 DIAGNOSIS — D849 Immunodeficiency, unspecified: Secondary | ICD-10-CM | POA: Diagnosis not present

## 2024-01-09 DIAGNOSIS — R41841 Cognitive communication deficit: Secondary | ICD-10-CM | POA: Diagnosis not present

## 2024-01-09 DIAGNOSIS — S32501A Unspecified fracture of right pubis, initial encounter for closed fracture: Secondary | ICD-10-CM | POA: Diagnosis not present

## 2024-01-09 DIAGNOSIS — S3289XA Fracture of other parts of pelvis, initial encounter for closed fracture: Secondary | ICD-10-CM | POA: Diagnosis not present

## 2024-01-09 DIAGNOSIS — I6381 Other cerebral infarction due to occlusion or stenosis of small artery: Secondary | ICD-10-CM | POA: Diagnosis not present

## 2024-01-09 DIAGNOSIS — Z79899 Other long term (current) drug therapy: Secondary | ICD-10-CM | POA: Diagnosis not present

## 2024-01-09 DIAGNOSIS — S32511A Fracture of superior rim of right pubis, initial encounter for closed fracture: Secondary | ICD-10-CM | POA: Diagnosis not present

## 2024-01-09 DIAGNOSIS — I69320 Aphasia following cerebral infarction: Secondary | ICD-10-CM | POA: Diagnosis not present

## 2024-01-09 DIAGNOSIS — M47816 Spondylosis without myelopathy or radiculopathy, lumbar region: Secondary | ICD-10-CM | POA: Diagnosis not present

## 2024-01-09 DIAGNOSIS — E440 Moderate protein-calorie malnutrition: Secondary | ICD-10-CM | POA: Diagnosis not present

## 2024-01-09 DIAGNOSIS — Z681 Body mass index (BMI) 19 or less, adult: Secondary | ICD-10-CM | POA: Diagnosis not present

## 2024-01-09 DIAGNOSIS — S32509A Unspecified fracture of unspecified pubis, initial encounter for closed fracture: Secondary | ICD-10-CM | POA: Diagnosis present

## 2024-01-09 DIAGNOSIS — S32591A Other specified fracture of right pubis, initial encounter for closed fracture: Secondary | ICD-10-CM | POA: Diagnosis not present

## 2024-01-09 DIAGNOSIS — F01A0 Vascular dementia, mild, without behavioral disturbance, psychotic disturbance, mood disturbance, and anxiety: Secondary | ICD-10-CM

## 2024-01-09 DIAGNOSIS — N1832 Chronic kidney disease, stage 3b: Secondary | ICD-10-CM | POA: Diagnosis not present

## 2024-01-09 DIAGNOSIS — F419 Anxiety disorder, unspecified: Secondary | ICD-10-CM | POA: Diagnosis not present

## 2024-01-09 DIAGNOSIS — I252 Old myocardial infarction: Secondary | ICD-10-CM

## 2024-01-09 DIAGNOSIS — M1990 Unspecified osteoarthritis, unspecified site: Secondary | ICD-10-CM | POA: Diagnosis not present

## 2024-01-09 DIAGNOSIS — Z823 Family history of stroke: Secondary | ICD-10-CM

## 2024-01-09 DIAGNOSIS — N183 Chronic kidney disease, stage 3 unspecified: Secondary | ICD-10-CM | POA: Diagnosis present

## 2024-01-09 DIAGNOSIS — R54 Age-related physical debility: Secondary | ICD-10-CM | POA: Diagnosis present

## 2024-01-09 DIAGNOSIS — R4701 Aphasia: Secondary | ICD-10-CM | POA: Diagnosis not present

## 2024-01-09 DIAGNOSIS — Z85828 Personal history of other malignant neoplasm of skin: Secondary | ICD-10-CM | POA: Diagnosis not present

## 2024-01-09 DIAGNOSIS — Y92099 Unspecified place in other non-institutional residence as the place of occurrence of the external cause: Secondary | ICD-10-CM

## 2024-01-09 DIAGNOSIS — Z9181 History of falling: Secondary | ICD-10-CM | POA: Diagnosis not present

## 2024-01-09 DIAGNOSIS — Z8249 Family history of ischemic heart disease and other diseases of the circulatory system: Secondary | ICD-10-CM | POA: Diagnosis not present

## 2024-01-09 DIAGNOSIS — Z880 Allergy status to penicillin: Secondary | ICD-10-CM | POA: Diagnosis not present

## 2024-01-09 DIAGNOSIS — Z7401 Bed confinement status: Secondary | ICD-10-CM | POA: Diagnosis not present

## 2024-01-09 DIAGNOSIS — N1831 Chronic kidney disease, stage 3a: Secondary | ICD-10-CM

## 2024-01-09 DIAGNOSIS — Z90710 Acquired absence of both cervix and uterus: Secondary | ICD-10-CM

## 2024-01-09 DIAGNOSIS — S32591D Other specified fracture of right pubis, subsequent encounter for fracture with routine healing: Secondary | ICD-10-CM | POA: Diagnosis not present

## 2024-01-09 DIAGNOSIS — Z8673 Personal history of transient ischemic attack (TIA), and cerebral infarction without residual deficits: Secondary | ICD-10-CM

## 2024-01-09 DIAGNOSIS — R2689 Other abnormalities of gait and mobility: Secondary | ICD-10-CM | POA: Diagnosis not present

## 2024-01-09 DIAGNOSIS — D72829 Elevated white blood cell count, unspecified: Secondary | ICD-10-CM | POA: Diagnosis not present

## 2024-01-09 DIAGNOSIS — R1312 Dysphagia, oropharyngeal phase: Secondary | ICD-10-CM | POA: Diagnosis not present

## 2024-01-09 DIAGNOSIS — E43 Unspecified severe protein-calorie malnutrition: Secondary | ICD-10-CM | POA: Diagnosis not present

## 2024-01-09 DIAGNOSIS — W19XXXA Unspecified fall, initial encounter: Secondary | ICD-10-CM | POA: Diagnosis not present

## 2024-01-09 DIAGNOSIS — M25552 Pain in left hip: Secondary | ICD-10-CM | POA: Diagnosis not present

## 2024-01-09 DIAGNOSIS — Z888 Allergy status to other drugs, medicaments and biological substances status: Secondary | ICD-10-CM | POA: Diagnosis not present

## 2024-01-09 DIAGNOSIS — R9082 White matter disease, unspecified: Secondary | ICD-10-CM | POA: Diagnosis not present

## 2024-01-09 DIAGNOSIS — I10 Essential (primary) hypertension: Secondary | ICD-10-CM | POA: Diagnosis not present

## 2024-01-09 DIAGNOSIS — J929 Pleural plaque without asbestos: Secondary | ICD-10-CM | POA: Diagnosis not present

## 2024-01-09 DIAGNOSIS — R079 Chest pain, unspecified: Secondary | ICD-10-CM | POA: Diagnosis not present

## 2024-01-09 DIAGNOSIS — S0990XA Unspecified injury of head, initial encounter: Secondary | ICD-10-CM | POA: Diagnosis not present

## 2024-01-09 DIAGNOSIS — M6281 Muscle weakness (generalized): Secondary | ICD-10-CM | POA: Diagnosis not present

## 2024-01-09 DIAGNOSIS — I69328 Other speech and language deficits following cerebral infarction: Secondary | ICD-10-CM | POA: Diagnosis not present

## 2024-01-09 DIAGNOSIS — I2510 Atherosclerotic heart disease of native coronary artery without angina pectoris: Secondary | ICD-10-CM | POA: Diagnosis not present

## 2024-01-09 DIAGNOSIS — I129 Hypertensive chronic kidney disease with stage 1 through stage 4 chronic kidney disease, or unspecified chronic kidney disease: Secondary | ICD-10-CM | POA: Diagnosis not present

## 2024-01-09 DIAGNOSIS — S199XXA Unspecified injury of neck, initial encounter: Secondary | ICD-10-CM | POA: Diagnosis not present

## 2024-01-09 DIAGNOSIS — F0150 Vascular dementia without behavioral disturbance: Secondary | ICD-10-CM | POA: Diagnosis not present

## 2024-01-09 DIAGNOSIS — Z96651 Presence of right artificial knee joint: Secondary | ICD-10-CM | POA: Diagnosis not present

## 2024-01-09 DIAGNOSIS — Z881 Allergy status to other antibiotic agents status: Secondary | ICD-10-CM

## 2024-01-09 DIAGNOSIS — M85851 Other specified disorders of bone density and structure, right thigh: Secondary | ICD-10-CM | POA: Diagnosis not present

## 2024-01-09 LAB — CBG MONITORING, ED: Glucose-Capillary: 147 mg/dL — ABNORMAL HIGH (ref 70–99)

## 2024-01-09 LAB — URINALYSIS, W/ REFLEX TO CULTURE (INFECTION SUSPECTED)
Bilirubin Urine: NEGATIVE
Glucose, UA: NEGATIVE mg/dL
Ketones, ur: NEGATIVE mg/dL
Leukocytes,Ua: NEGATIVE
Nitrite: NEGATIVE
Protein, ur: NEGATIVE mg/dL
Specific Gravity, Urine: 1.011 (ref 1.005–1.030)
pH: 5 (ref 5.0–8.0)

## 2024-01-09 LAB — COMPREHENSIVE METABOLIC PANEL WITH GFR
ALT: 14 U/L (ref 0–44)
AST: 29 U/L (ref 15–41)
Albumin: 3.6 g/dL (ref 3.5–5.0)
Alkaline Phosphatase: 40 U/L (ref 38–126)
Anion gap: 12 (ref 5–15)
BUN: 19 mg/dL (ref 8–23)
CO2: 26 mmol/L (ref 22–32)
Calcium: 9.5 mg/dL (ref 8.9–10.3)
Chloride: 102 mmol/L (ref 98–111)
Creatinine, Ser: 1.22 mg/dL — ABNORMAL HIGH (ref 0.44–1.00)
GFR, Estimated: 43 mL/min — ABNORMAL LOW (ref >60)
Glucose, Bld: 136 mg/dL — ABNORMAL HIGH (ref 70–99)
Potassium: 4 mmol/L (ref 3.5–5.1)
Sodium: 140 mmol/L (ref 135–145)
Total Bilirubin: 1 mg/dL (ref 0.0–1.2)
Total Protein: 6.6 g/dL (ref 6.5–8.1)

## 2024-01-09 LAB — CBC WITH DIFFERENTIAL/PLATELET
Abs Immature Granulocytes: 0.05 10*3/uL (ref 0.00–0.07)
Basophils Absolute: 0 10*3/uL (ref 0.0–0.1)
Basophils Relative: 0 %
Eosinophils Absolute: 0 10*3/uL (ref 0.0–0.5)
Eosinophils Relative: 0 %
HCT: 44.4 % (ref 36.0–46.0)
Hemoglobin: 14.5 g/dL (ref 12.0–15.0)
Immature Granulocytes: 1 %
Lymphocytes Relative: 4 %
Lymphs Abs: 0.4 10*3/uL — ABNORMAL LOW (ref 0.7–4.0)
MCH: 28.9 pg (ref 26.0–34.0)
MCHC: 32.7 g/dL (ref 30.0–36.0)
MCV: 88.4 fL (ref 80.0–100.0)
Monocytes Absolute: 0.6 10*3/uL (ref 0.1–1.0)
Monocytes Relative: 6 %
Neutro Abs: 9.9 10*3/uL — ABNORMAL HIGH (ref 1.7–7.7)
Neutrophils Relative %: 89 %
Platelets: 156 10*3/uL (ref 150–400)
RBC: 5.02 MIL/uL (ref 3.87–5.11)
RDW: 12.9 % (ref 11.5–15.5)
WBC: 11 10*3/uL — ABNORMAL HIGH (ref 4.0–10.5)
nRBC: 0 % (ref 0.0–0.2)

## 2024-01-09 LAB — CK: Total CK: 130 U/L (ref 38–234)

## 2024-01-09 MED ORDER — ESCITALOPRAM OXALATE 10 MG PO TABS
5.0000 mg | ORAL_TABLET | Freq: Every day | ORAL | Status: DC
  Administered 2024-01-10 – 2024-01-14 (×5): 5 mg via ORAL
  Filled 2024-01-09 (×5): qty 1

## 2024-01-09 MED ORDER — SODIUM CHLORIDE 0.9 % IV SOLN: INTRAVENOUS | Status: AC

## 2024-01-09 MED ORDER — ACETAMINOPHEN 325 MG PO TABS
650.0000 mg | ORAL_TABLET | Freq: Once | ORAL | Status: AC
  Administered 2024-01-09: 650 mg via ORAL
  Filled 2024-01-09: qty 2

## 2024-01-09 MED ORDER — ACETAMINOPHEN 325 MG PO TABS: 650.0000 mg | ORAL_TABLET | Freq: Four times a day (QID) | ORAL | Status: DC | PRN

## 2024-01-09 MED ORDER — FENTANYL CITRATE PF 50 MCG/ML IJ SOSY: 12.5000 ug | PREFILLED_SYRINGE | INTRAMUSCULAR | Status: DC | PRN

## 2024-01-09 MED ORDER — METOPROLOL SUCCINATE ER 25 MG PO TB24
25.0000 mg | ORAL_TABLET | Freq: Every day | ORAL | Status: DC
  Administered 2024-01-10 – 2024-01-14 (×5): 25 mg via ORAL
  Filled 2024-01-09 (×5): qty 1

## 2024-01-09 MED ORDER — CLOPIDOGREL BISULFATE 75 MG PO TABS
75.0000 mg | ORAL_TABLET | Freq: Every day | ORAL | Status: DC
  Administered 2024-01-10 – 2024-01-14 (×5): 75 mg via ORAL
  Filled 2024-01-09 (×5): qty 1

## 2024-01-09 MED ORDER — HYDROCODONE-ACETAMINOPHEN 5-325 MG PO TABS
1.0000 | ORAL_TABLET | ORAL | Status: DC | PRN
  Administered 2024-01-10 (×2): 1 via ORAL
  Administered 2024-01-11: 2 via ORAL
  Administered 2024-01-12: 1 via ORAL
  Administered 2024-01-14: 2 via ORAL
  Filled 2024-01-09: qty 2
  Filled 2024-01-09: qty 1
  Filled 2024-01-09: qty 2
  Filled 2024-01-09 (×2): qty 1

## 2024-01-09 MED ORDER — ONDANSETRON HCL 4 MG PO TABS: 4.0000 mg | ORAL_TABLET | Freq: Four times a day (QID) | ORAL | Status: DC | PRN

## 2024-01-09 MED ORDER — ACETAMINOPHEN 650 MG RE SUPP: 650.0000 mg | Freq: Four times a day (QID) | RECTAL | Status: DC | PRN

## 2024-01-09 MED ORDER — ONDANSETRON HCL 4 MG/2ML IJ SOLN: 4.0000 mg | Freq: Four times a day (QID) | INTRAMUSCULAR | Status: DC | PRN

## 2024-01-09 NOTE — Assessment & Plan Note (Signed)
 Chronic as a result of CVA

## 2024-01-09 NOTE — Plan of Care (Signed)
   Problem: Health Behavior/Discharge Planning: Goal: Ability to manage health-related needs will improve Outcome: Progressing

## 2024-01-09 NOTE — ED Notes (Signed)
 Pt in bed, family at bedside, states that pt is at baseline

## 2024-01-09 NOTE — ED Notes (Signed)
 CCMD called and patient placed on monitor

## 2024-01-09 NOTE — Assessment & Plan Note (Signed)
 Chronic stable Monitor for sundowning

## 2024-01-09 NOTE — Subjective & Objective (Addendum)
 Came from AL with a fall Was too weak to get up form the toilet Felt a bit dizzy prior to getting up  [facility] against the wall  At baseline walks independently  Hx  of CVA and dementia, therefore has difficulty providing hx Denies hitting head or LOC

## 2024-01-09 NOTE — Assessment & Plan Note (Signed)
-  chronic avoid nephrotoxic medications such as NSAIDs, Vanco Zosyn combo,  avoid hypotension, continue to follow renal function

## 2024-01-09 NOTE — Assessment & Plan Note (Signed)
 Pain control Will need PT OT eval and probably SNF placement

## 2024-01-09 NOTE — ED Notes (Signed)
 Patient transported to CT

## 2024-01-09 NOTE — H&P (Signed)
 Crystal Day:[id number] DOB: 20-May-1935 DOA: 01/09/2024     PCP: Faustina Hood, MD     Patient arrived to ER on 01/09/24 at 1758 Referred by Attending Nolberto Batty, DO   Patient coming from:    From facility  [place]    Chief Complaint:   Chief Complaint  Patient presents with   Fall    HPI: Crystal Day is a 89 y.o. female with medical history significant of aphasia, vascular dementia, CVA, hypertension, anxiety, HTN, HLD    Presented with   fall [place]ame from [place] with a fall Was too weak to get up form the toilet Felt a bit dizzy prior to getting up  Marvell Slider against the wall  At baseline walks independently  Hx  of CVA and dementia, therefore has difficulty providing hx Denies hitting head or LOC    Family feels her feet got tangled up as soon as the family left no syncope. She managed to get up tired to use commode, could not get up for hours finally family found her  Denies significant ETOH intake   Does not smoke    Regarding pertinent Chronic problems:    Hyperlipidemia -  not on statins   Lipid Panel     Component Value Date/Time   CHOL 279 (H) 04/26/2019 1200   TRIG 230 (H) 04/26/2019 1200   HDL 43 04/26/2019 1200   CHOLHDL 6.5 04/26/2019 1200   VLDL 46 (H) 04/26/2019 1200   LDLCALC 190 (H) 04/26/2019 1200     HTN on Cozaar metoprolol     last echo  Recent Results (from the past 81191 hours)  ECHOCARDIOGRAM COMPLETE   Collection Time: 04/26/19  1:26 PM  Result Value   BP 167/87   Narrative     ECHOCARDIOGRAM REPORT       IMPRESSIONS    1. The left ventricle has normal systolic function with an ejection fraction of 60-65%. The cavity size was normal. There is mildly increased left ventricular wall thickness. Left ventricular diastolic Doppler parameters are consistent with impaired  relaxation. Elevated left ventricular end-diastolic pressure The E/e' is >47. No evidence of left ventricular regional wall motion abnormalities.  2.  The right ventricle has normal systolic function. The cavity was normal. There is no increase in right ventricular wall thickness.  3. The mitral valve is abnormal. Mild thickening of the mitral valve leaflet. Mild calcification of the mitral valve leaflet.  4. The tricuspid valve is grossly normal.  5. The aortic valve is tricuspid. No stenosis of the aortic valve.  6. The aorta is normal unless otherwise noted.  SUMMARY           Hx of CVA last 2022 -  on  Plavix      CKD stage IIIa   baseline Cr 1.4 Estimated Creatinine Clearance: 31.4 mL/min (A) (by C-G formula based on SCr of 1.22 mg/dL (H)).  Lab Results  Component Value Date   CREATININE 1.22 (H) 01/09/2024   CREATININE 1.40 (H) 08/30/2022   CREATININE 1.10 (H) 04/08/2021   Lab Results  Component Value Date   NA 140 01/09/2024   CL 102 01/09/2024   K 4.0 01/09/2024   CO2 26 01/09/2024   BUN 19 01/09/2024   CREATININE 1.22 (H) 01/09/2024   GFRNONAA 43 (L) 01/09/2024   CALCIUM  9.5 01/09/2024   ALBUMIN 3.6 01/09/2024   GLUCOSE 136 (H) 01/09/2024      While in ER: Clinical Course as of 01/09/24  2047  Sun Jan 09, 2024  1922 Hip XR shows fracture of R inferior pubic ramus. [VK]  1940 Cr at baseline, normal CK. Mild leukocytosis. UA is pending. [VK]  1941 No acute traumatic injury on CT imaging. [VK]  1953 Patient's UA is pending at this time. States she normally walks independently and was having too much pain to walk at home. Will plan for admission for pain control and PT eval. [VK]  2032 Rare bacteria, otherwise urine negative. Low suspicion for infection. [VK]    Clinical Course User Index [VK] Nolberto Batty, DO       Lab Orders         Comprehensive metabolic panel         CBC with Differential         CK         Urinalysis, w/ Reflex to Culture (Infection Suspected) -Urine, Clean Catch         CBG monitoring, ED     Pelvis shows fracture of right inferior pubis ramus CT HEAD/ neck   NON acute     CXR -  NON acute   Following Medications were ordered in ER: Medications  acetaminophen  (TYLENOL ) tablet 650 mg (650 mg Oral Given 01/09/24 2003)    _______     ED Triage Vitals  Encounter Vitals Group     BP 01/09/24 1805 (!) 185/95     Systolic BP Percentile --      Diastolic BP Percentile --      Pulse Rate 01/09/24 1805 68     Resp 01/09/24 1805 18     Temp 01/09/24 1805 98 F (36.7 C)     Temp Source 01/09/24 1805 Oral     SpO2 01/09/24 1805 100 %     Weight 01/09/24 1802 135 lb (61.2 kg)     Height 01/09/24 1802 5\' 10"  (1.778 m)     Head Circumference --      Peak Flow --      Pain Score 01/09/24 1802 8     Pain Loc --      Pain Education --      Exclude from Growth Chart --   ZOXW(96)@     _________________________________________ Significant initial  Findings: Abnormal Labs Reviewed  COMPREHENSIVE METABOLIC PANEL WITH GFR - Abnormal; Notable for the following components:      Result Value   Glucose, Bld 136 (*)    Creatinine, Ser 1.22 (*)    GFR, Estimated 43 (*)    All other components within normal limits  CBC WITH DIFFERENTIAL/PLATELET - Abnormal; Notable for the following components:   WBC 11.0 (*)    Neutro Abs 9.9 (*)    Lymphs Abs 0.4 (*)    All other components within normal limits  URINALYSIS, W/ REFLEX TO CULTURE (INFECTION SUSPECTED) - Abnormal; Notable for the following components:   Hgb urine dipstick SMALL (*)    Bacteria, UA RARE (*)    All other components within normal limits  CBG MONITORING, ED - Abnormal; Notable for the following components:   Glucose-Capillary 147 (*)    All other components within normal limits       Cardiac Panel (last 3 results) Recent Labs    01/09/24 1836  CKTOTAL 130  If you can I get to their  ECG: Ordered Personally reviewed and interpreted by me showing: HR : 70 Rhythm:  NSR,  Prominent P waves, nondiagnostic Borderline T abnormalities,    no  evidence of ischemic changes QTC 455   The recent  clinical data is shown below. Vitals:   01/09/24 1815 01/09/24 1948 01/09/24 2000 01/09/24 2003  BP: (!) 190/84 (!) 180/79 (!) 132/108   Pulse: 67   90  Resp: 18 20 15 16   Temp:      TempSrc:      SpO2: 100% 100% 91% 93%  Weight:      Height:        WBC     Component Value Date/Time   WBC 11.0 (H) 01/09/2024 1836   LYMPHSABS 0.4 (L) 01/09/2024 1836   MONOABS 0.6 01/09/2024 1836   EOSABS 0.0 01/09/2024 1836   BASOSABS 0.0 01/09/2024 1836      UA   no evidence of UTI      Urine analysis:    Component Value Date/Time   COLORURINE YELLOW 01/09/2024 1957   APPEARANCEUR CLEAR 01/09/2024 1957   LABSPEC 1.011 01/09/2024 1957   PHURINE 5.0 01/09/2024 1957   GLUCOSEU NEGATIVE 01/09/2024 1957   HGBUR SMALL (A) 01/09/2024 1957   BILIRUBINUR NEGATIVE 01/09/2024 1957   KETONESUR NEGATIVE 01/09/2024 1957   PROTEINUR NEGATIVE 01/09/2024 1957   UROBILINOGEN 0.2 12/26/2014 1030   NITRITE NEGATIVE 01/09/2024 1957   LEUKOCYTESUR NEGATIVE 01/09/2024 1957     __________________________________________________________ Recent Labs  Lab 01/09/24 1836  NA 140  K 4.0  CO2 26  GLUCOSE 136*  BUN 19  CREATININE 1.22*  CALCIUM  9.5    Cr   stable,    Lab Results  Component Value Date   CREATININE 1.22 (H) 01/09/2024   CREATININE 1.40 (H) 08/30/2022   CREATININE 1.10 (H) 04/08/2021    Recent Labs  Lab 01/09/24 1836  AST 29  ALT 14  ALKPHOS 40  BILITOT 1.0  PROT 6.6  ALBUMIN 3.6   Lab Results  Component Value Date   CALCIUM  9.5 01/09/2024        Plt: Lab Results  Component Value Date   PLT 156 01/09/2024    Recent Labs  Lab 01/09/24 1836  WBC 11.0*  NEUTROABS 9.9*  HGB 14.5  HCT 44.4  MCV 88.4  PLT 156    HG/HCT  stable,       Component Value Date/Time   HGB 14.5 01/09/2024 1836   HCT 44.4 01/09/2024 1836   MCV 88.4 01/09/2024 1836    _______________________________________________ Hospitalist was called for admission for   Closed fracture of ramus  of right pubis,   Fall,    The following Work up has been ordered so far:  Orders Placed This Encounter  Procedures   CT Head Wo Contrast   CT Cervical Spine Wo Contrast   DG Hip Unilat With Pelvis 2-3 Views Right   DG Chest Port 1 View   Comprehensive metabolic panel   CBC with Differential   CK   Urinalysis, w/ Reflex to Culture (Infection Suspected) -Urine, Clean Catch   Vital signs   Catherize if unable to void   Consult to hospitalist   CBG monitoring, ED   EKG 12-Lead     OTHER Significant initial  Findings:  labs showing:     DM  labs:  HbA1C: No results for input(s): "HGBA1C" in the last 8760 hours.     CBG (last 3)  Recent Labs    01/09/24 1832  GLUCAP 147*          Cultures: No results found for: "SDES", "SPECREQUEST", "CULT", "REPTSTATUS"   Radiological Exams on Admission: CT  Head Wo Contrast Result Date: 01/09/2024 CLINICAL DATA:  Fall, trauma EXAM: CT HEAD WITHOUT CONTRAST CT CERVICAL SPINE WITHOUT CONTRAST TECHNIQUE: Multidetector CT imaging of the head and cervical spine was performed following the standard protocol without intravenous contrast. Multiplanar CT image reconstructions of the cervical spine were also generated. RADIATION DOSE REDUCTION: This exam was performed according to the departmental dose-optimization program which includes automated exposure control, adjustment of the mA and/or kV according to patient size and/or use of iterative reconstruction technique. COMPARISON:  04/08/2021 FINDINGS: CT HEAD FINDINGS Brain: No evidence of acute infarction, hemorrhage, hydrocephalus, extra-axial collection or mass lesion/mass effect. Periventricular and deep white matter hypodensity. Multiple lacunar infarctions of the basal ganglia, corona radiata, and thalami as well as the cerebellar hemisphere series. Vascular: No hyperdense vessel or unexpected calcification. Skull: Normal. Negative for fracture or focal lesion. Sinuses/Orbits: No acute finding.  Other: None. CT CERVICAL SPINE FINDINGS Alignment: Degenerative straightening of the normal cervical lordosis. Skull base and vertebrae: No acute fracture. No primary bone lesion or focal pathologic process. Soft tissues and spinal canal: No prevertebral fluid or swelling. No visible canal hematoma. Disc levels: Mild-to-moderate multilevel cervical disc degenerative disease, worst from C5-C7 Upper chest: Negative. Other: None. IMPRESSION: 1. No acute intracranial pathology. Small-vessel white matter disease and multiple lacunar infarctions. 2. No fracture or static subluxation of the cervical spine. 3. Mild-to-moderate multilevel cervical disc degenerative disease, worst from C5-C7. Electronically Signed   By: Fredricka Jenny M.D.   On: 01/09/2024 19:38   CT Cervical Spine Wo Contrast Result Date: 01/09/2024 CLINICAL DATA:  Fall, trauma EXAM: CT HEAD WITHOUT CONTRAST CT CERVICAL SPINE WITHOUT CONTRAST TECHNIQUE: Multidetector CT imaging of the head and cervical spine was performed following the standard protocol without intravenous contrast. Multiplanar CT image reconstructions of the cervical spine were also generated. RADIATION DOSE REDUCTION: This exam was performed according to the departmental dose-optimization program which includes automated exposure control, adjustment of the mA and/or kV according to patient size and/or use of iterative reconstruction technique. COMPARISON:  04/08/2021 FINDINGS: CT HEAD FINDINGS Brain: No evidence of acute infarction, hemorrhage, hydrocephalus, extra-axial collection or mass lesion/mass effect. Periventricular and deep white matter hypodensity. Multiple lacunar infarctions of the basal ganglia, corona radiata, and thalami as well as the cerebellar hemisphere series. Vascular: No hyperdense vessel or unexpected calcification. Skull: Normal. Negative for fracture or focal lesion. Sinuses/Orbits: No acute finding. Other: None. CT CERVICAL SPINE FINDINGS Alignment:  Degenerative straightening of the normal cervical lordosis. Skull base and vertebrae: No acute fracture. No primary bone lesion or focal pathologic process. Soft tissues and spinal canal: No prevertebral fluid or swelling. No visible canal hematoma. Disc levels: Mild-to-moderate multilevel cervical disc degenerative disease, worst from C5-C7 Upper chest: Negative. Other: None. IMPRESSION: 1. No acute intracranial pathology. Small-vessel white matter disease and multiple lacunar infarctions. 2. No fracture or static subluxation of the cervical spine. 3. Mild-to-moderate multilevel cervical disc degenerative disease, worst from C5-C7. Electronically Signed   By: Fredricka Jenny M.D.   On: 01/09/2024 19:38   DG Hip Unilat With Pelvis 2-3 Views Right Result Date: 01/09/2024 CLINICAL DATA:  Pain after fall EXAM: DG HIP (WITH OR WITHOUT PELVIS) 3V RIGHT COMPARISON:  None Available. FINDINGS: There is a fracture of the right inferior pubic ramus. No additional fracture or dislocation. Preserved joint spaces. Osteopenia. Vascular calcifications in the pelvis. Degenerative changes of the visualized portions of the lumbar spine. Overlapping cardiac leads. If there is further concern of hip fracture additional cross-sectional imaging study  could be considered as clinically appropriate further delineation in the setting of osteopenia. IMPRESSION: Fracture of the right inferior pubic ramus.  Osteopenia. Electronically Signed   By: Adrianna Horde M.D.   On: 01/09/2024 19:18   DG Chest Port 1 View Result Date: 01/09/2024 CLINICAL DATA:  Pain after fall EXAM: PORTABLE CHEST 1 VIEW COMPARISON:  X-ray 08/30/2022. FINDINGS: Hyperinflation. No consolidation, pneumothorax or effusion. Normal cardiopericardial silhouette with a calcified aorta. Apical pleural thickening. Overlapping cardiac leads. The left inferior costophrenic angle is clipped off the edge of the film. Degenerative changes along the spine. If there is further concern  of the sequela of trauma additional CT workup as clinically appropriate for further delineation. IMPRESSION: Hyperinflation.  Chronic changes.  No acute cardiopulmonary disease. Electronically Signed   By: Adrianna Horde M.D.   On: 01/09/2024 19:17   _______________________________________________________________________________________________________ Latest  Blood pressure (!) 132/108, pulse 90, temperature 98 F (36.7 C), temperature source Oral, resp. rate 16, height 5\' 10"  (1.778 m), weight 61.2 kg, SpO2 93%.   Vitals  labs and radiology finding personally reviewed  Review of Systems:    Pertinent positives include: fall  Constitutional:  No weight loss, night sweats, Fevers, chills, fatigue, weight loss  HEENT:  No headaches, Difficulty swallowing,Tooth/dental problems,Sore throat,  No sneezing, itching, ear ache, nasal congestion, post nasal drip,  Cardio-vascular:  No chest pain, Orthopnea, PND, anasarca, dizziness, palpitations.no Bilateral lower extremity swelling  GI:  No heartburn, indigestion, abdominal pain, nausea, vomiting, diarrhea, change in bowel habits, loss of appetite, melena, blood in stool, hematemesis Resp:  no shortness of breath at rest. No dyspnea on exertion, No excess mucus, no productive cough, No non-productive cough, No coughing up of blood.No change in color of mucus.No wheezing. Skin:  no rash or lesions. No jaundice GU:  no dysuria, change in color of urine, no urgency or frequency. No straining to urinate.  No flank pain.  Musculoskeletal:  No joint pain or no joint swelling. No decreased range of motion. No back pain.  Psych:  No change in mood or affect. No depression or anxiety. No memory loss.  Neuro: no localizing neurological complaints, no tingling, no weakness, no double vision, no gait abnormality, no slurred speech, no confusion  All systems reviewed and apart from HOPI all are  negative _______________________________________________________________________________________________ Past Medical History:   Past Medical History:  Diagnosis Date   Anginal pain (HCC) since 2014   Arthritis    just in the hands   Cancer (HCC) 1988 or 89   basal cell carcinoma on face s/p Mohs procedure   Complication of anesthesia    nausea   History of wheezing    in spring and fall   Hyperlipidemia    Hypertension    Non-obstructive CAD    a. 09/2013 Cath/NSTEMI: nonobs dzs.   Non-STEMI (non-ST elevated myocardial infarction) (HCC)    a. 09/2013 - peak trop 5.38;  b. 09/2013 Cath: nonobs dzs-->Med Rx.   Pneumonia    "in my 20's"   PONV (postoperative nausea and vomiting)    Stroke [facility]) 2009   a. 09/2015 L Thalamic Lacunar infarct, presumed to be 2/2 small vessel dzs, residuals include numbness on right side of lips and right finger tips; b. 09/2015 Echo: nl LV fxn; c. 09/2015 Carotid U/S: mild bilat ICA stenosis.    Past Surgical History:  Procedure Laterality Date   ABDOMINAL HYSTERECTOMY  08/17/1972   BREAST SURGERY Left 08/17/1974   lumpectomy   EYE SURGERY  cataract surgery, both eyes   FRACTURE SURGERY Right 08/17/1969   thumb and index finger   LEFT HEART CATHETERIZATION WITH CORONARY ANGIOGRAM N/A 09/29/2013   Procedure: LEFT HEART CATHETERIZATION WITH CORONARY ANGIOGRAM;  Surgeon: Peter M Swaziland, MD;  Location: [facility] CATH LAB;  Service: Cardiovascular;  Laterality: N/A;   PARTIAL KNEE ARTHROPLASTY Right 12/31/2014   Procedure: RIGHT KNEE MEDIAL UNICOMPARTMENTAL ARTHROPLASTY;  Surgeon: Liliane Rei, MD;  Location: [facility] ORS;  Service: Orthopedics;  Laterality: Right;   skin cancer removal  02/2023    Social History:  Ambulatory   independently       reports that she quit smoking about 42 years ago. Her smoking use included cigarettes. She started smoking about 47 years ago. She has a 1.3 pack-year smoking history. She has never used smokeless tobacco. She reports  that she does not drink alcohol and does not use drugs.   Family History:   Family History  Problem Relation Age of Onset   Heart attack Father 81   Cancer Mother    Stroke Brother    Stroke Paternal Grandmother    Heart attack Maternal Grandfather    ______________________________________________________________________________________________ Allergies: Allergies  Allergen Reactions   Penicillins Hives   Tetracyclines & Related Nausea Only   Elavil [Amitriptyline] Rash   Erythromycin Rash   Macrodantin [Nitrofurantoin Macrocrystal] Rash     Prior to Admission medications   Medication Sig Start Date End Date Taking? Authorizing Provider  albuterol  (VENTOLIN  HFA) 108 (90 Base) MCG/ACT inhaler Inhale 2 puffs into the lungs every 4 (four) hours as needed.   Yes [provider]  clopidogrel  (PLAVIX ) 75 MG tablet Take 1 tablet (75 mg total) by mouth daily. 04/15/23  Yes Sater, Sherida Dimmer, MD  escitalopram (LEXAPRO) 5 MG tablet Take 5 mg by mouth daily.   Yes [provider]  losartan (COZAAR) 50 MG tablet Take 50 mg by mouth daily. 03/27/21  Yes [provider]  metoprolol  succinate (TOPROL -XL) 25 MG 24 hr tablet Take 25 mg by mouth daily. 11/30/23  Yes [provider]  cefdinir (OMNICEF) 300 MG capsule Take 300 mg by mouth 2 (two) times daily. Patient not taking: Reported on 01/09/2024 12/14/23   [provider]  metoprolol  tartrate (LOPRESSOR ) 25 MG tablet Take 25 mg by mouth 2 (two) times daily. Extended Release Patient not taking: Reported on 01/09/2024 03/28/19   [provider]  nitroGLYCERIN  (NITROSTAT ) 0.4 MG SL tablet Place 1 tablet (0.4 mg total) under the tongue every 5 (five) minutes as needed for chest pain. Patient not taking: Reported on 04/15/2023 03/11/16   Florette Hurry, NP  predniSONE (DELTASONE) 20 MG tablet Take 20 mg by mouth daily. Patient not taking: Reported on 01/09/2024    [provider]  Red  Yeast Rice Extract (RED YEAST RICE PO) Take 1 tablet by mouth daily. Patient not taking: Reported on 04/15/2023    [provider]    ___________________________________________________________________________________________________ Physical Exam:    01/09/2024    8:03 PM 01/09/2024    8:00 PM 01/09/2024    7:48 PM  Vitals with BMI  Systolic  132 180  Diastolic  962 79  Pulse 90       1. General:  in No  Acute distress    Chronically ill  -appearing 2. Psychological: Alert and  Oriented 3. Head/ENT:    Dry Mucous Membranes  Head Non traumatic, neck supple                          Poor Dentition 4. SKIN:  decreased Skin turgor,  Skin clean Dry and intact no rash    5. Heart: Regular rate and rhythm no  Murmur, no Rub or gallop 6. Lungs:  , no wheezes or crackles   7. Abdomen: Soft,  non-tender, Non distended  bowel sounds present 8. Lower extremities: no clubbing, cyanosis, no  edema 9. Neurologically Grossly intact, moving all 4 extremities equally  10. MSK: Normal range of motion    Chart has been reviewed  ______________________________________________________________________________________________  Assessment/Plan  89 y.o. female with medical history significant of aphasia, vascular dementia, CVA, hypertension, anxiety, HTN, HLD   Admitted for   Closed fracture of ramus of right pubis  Fall,    Present on Admission:  Pubic bone fracture (HCC)  Aphasia  CKD (chronic kidney disease) stage 3, GFR 30-59 ml/min (HCC)  Vascular dementia (HCC)     Aphasia Chronic as a result of CVA  CKD (chronic kidney disease) stage 3, GFR 30-59 ml/min (HCC)  -chronic avoid nephrotoxic medications such as NSAIDs, Vanco Zosyn combo,  avoid hypotension, continue to follow renal function   History of CVA (cerebrovascular accident) Continue Plavix  75 mg po q day  Pubic bone fracture (HCC) Pain control Will need PT OT eval and probably SNF  placement  Vascular dementia (HCC) Chronic stable Monitor for sundowning   Other plan as per orders.  DVT prophylaxis:  SCD       Code Status:    Code Status: Prior FULL CODE   as per patient   I had personally discussed CODE STATUS with patient and family   ACP   none   Family Communication:   Family  at  Bedside  plan of care was discussed  with  Daughter,   Diet  heart healthy   Disposition Plan:     likely will need placement for rehabilitation                             Following barriers for discharge:                                                        Pain controlled with PO medications                                     Consult Orders  (From admission, onward)           Start     Ordered   01/09/24 2003  Consult to hospitalist  Pg by Landon Pinion  Once       Provider:  (Not yet assigned)  Question Answer Comment  Place call to: [facility]   Reason for Consult Admit      01/09/24 2002                               Would benefit from PT/OT eval prior to DC  Ordered  Transition of care consulted                                     Consults called: none     Admission status:  ED Disposition     ED Disposition  Admit   Condition  --   Comment  Hospital Area: [facility] [[id number]]  Level of Care: Telemetry Medical [104]  May admit patient to [facility] or [facility] if equivalent level of care is available:: No  Covid Evaluation: Asymptomatic - no recent exposure (last 10 days) testing not required  Diagnosis: Pubic bone fracture [facility]) [[id number]]  Admitting Physician: Marriah Sanderlin [[id number]]  Attending Physician: Kellie Chisolm [[id number]]  Certification:: I certify this patient will need inpatient services for at least 2 midnights  Expected Medical Readiness: 01/12/2024            inpatient     I Expect 2 midnight stay secondary to severity of patient's current illness  need for inpatient interventions justified by the following:     Severe lab/radiological/exam abnormalities including:    Closed fracture of ramus of right pubis,   Fall,   and extensive comorbidities including:  Dementia   That are currently affecting medical management.   I expect  patient to be hospitalized for 2 midnights requiring inpatient medical care.  Patient is at high risk for adverse outcome (such as loss of life or disability) if not treated.  Indication for inpatient stay as follows:   severe pain requiring acute inpatient management,    Need for  IV fluids, , IV pain medications,     Level of care     tele  For 12H   Freeda Spivey 01/09/2024, 9:47 PM    [facility]     after 2 AM please page floor coverage   If 7AM-7PM, please contact the day team taking care of the patient using [email or web address]

## 2024-01-09 NOTE — ED Provider Notes (Signed)
 [place] [facility]ERGENCY DEPARTMENT AT [facility] Provider Note   CSN: [id number] Arrival date & time: 01/09/24  1758     History  Chief Complaint  Patient presents with  . Fall    Crystal Day is a 89 y.o. female.  Patient is an 89 year old female with a past medical history of hypertension, prior CVA, vascular dementia presenting to the emergency department after a fall.  History is somewhat limited due to patient's history of aphasia and dementia but she reports that she fell today in the bathroom at the sink.  She states that she was on the toilet and unable to get up.  She states that she was there all afternoon.  She denies hitting her head or losing consciousness.  She states that she did fall on her bottom and is having pain across her hips, worse on the right side compared to the left.  She states that she was dizzy prior to falling.  Denies any chest pain or shortness of breath.  Denies any vomiting or diarrhea.  She states that she was unable to take her medications this morning but her daughter reports that she did take her medications just prior to arrival in the ED.  The history is provided by the patient and a relative. The history is limited by the condition of the patient (Level 5 caveat for dementia).  Fall       Home Medications Prior to Admission medications   Medication Sig Start Date End Date Taking? Authorizing Provider  clopidogrel  (PLAVIX ) 75 MG tablet Take 1 tablet (75 mg total) by mouth daily. 04/15/23   Sater, Sherida Dimmer, MD  escitalopram (LEXAPRO) 5 MG tablet Take 5 mg by mouth daily.    [provider]  losartan (COZAAR) 50 MG tablet Take 50 mg by mouth daily. 03/27/21   [provider]  metoprolol  tartrate (LOPRESSOR ) 25 MG tablet Take 25 mg by mouth 2 (two) times daily. Extended Release 03/28/19   [provider]  nitroGLYCERIN  (NITROSTAT ) 0.4 MG SL tablet Place 1 tablet (0.4 mg total) under the tongue every 5 (five)  minutes as needed for chest pain. Patient not taking: Reported on 04/15/2023 03/11/16   Florette Hurry, NP  Red Yeast Rice Extract (RED YEAST RICE PO) Take 1 tablet by mouth daily. Patient not taking: Reported on 04/15/2023    [provider]      Allergies    Penicillins, Tetracyclines & related, Elavil [amitriptyline], Erythromycin, and Macrodantin [nitrofurantoin macrocrystal]    Review of Systems   Review of Systems  Physical Exam Updated Vital Signs BP (!) 185/95 (BP Location: Right Arm)   Pulse 68   Temp 98 F (36.7 C) (Oral)   Resp 18   Ht 5\' 10"  (1.778 m)   Wt 61.2 kg   SpO2 100%   BMI 19.37 kg/m  Physical Exam Vitals and nursing note reviewed.  Constitutional:      General: She is not in acute distress.    Appearance: Normal appearance.  HENT:     Head: Normocephalic and atraumatic.     Nose: Nose normal.     Mouth/Throat:     Mouth: Mucous membranes are moist.     Pharynx: Oropharynx is clear.  Eyes:     Extraocular Movements: Extraocular movements intact.     Conjunctiva/sclera: Conjunctivae normal.     Pupils: Pupils are equal, round, and reactive to light.  Neck:     Comments: No midline neck tenderness  Cardiovascular:     Rate and Rhythm: Normal rate and regular rhythm.     Heart sounds: Normal heart sounds.  Pulmonary:     Effort: Pulmonary effort is normal.     Breath sounds: Normal breath sounds.  Abdominal:     General: Abdomen is flat.     Palpations: Abdomen is soft.     Tenderness: There is no abdominal tenderness.  Musculoskeletal:     Cervical back: Normal range of motion and neck supple.     Comments: No midline back tenderness No bony tenderness to bilateral UE Pelvis stable, tenderness across anterior pelvis Tenderness to palpation of R hip with increased pain with internal/external rotation No tenderness to remainder of RLE or LLE  Skin:    General: Skin is warm and dry.     Comments: Abrasions to mid back   Neurological:     General: No focal deficit present.     Mental Status: She is alert. Mental status is at baseline.  Psychiatric:        Mood and Affect: Mood normal.        Behavior: Behavior normal.     ED Results / Procedures / Treatments   Labs (all labs ordered are listed, but only abnormal results are displayed) Labs Reviewed  COMPREHENSIVE METABOLIC PANEL WITH GFR - Abnormal; Notable for the following components:      Result Value   Glucose, Bld 136 (*)    Creatinine, Ser 1.22 (*)    GFR, Estimated 43 (*)    All other components within normal limits  CBC WITH DIFFERENTIAL/PLATELET - Abnormal; Notable for the following components:   WBC 11.0 (*)    Neutro Abs 9.9 (*)    Lymphs Abs 0.4 (*)    All other components within normal limits  CBG MONITORING, ED - Abnormal; Notable for the following components:   Glucose-Capillary 147 (*)    All other components within normal limits  CK  URINALYSIS, W/ REFLEX TO CULTURE (INFECTION SUSPECTED)    EKG EKG Interpretation Date/Time:  Sunday Jan 09 2024 18:10:01 EDT Ventricular Rate:  70 PR Interval:  164 QRS Duration:  98 QT Interval:  421 QTC Calculation: 455 R Axis:   51  Text Interpretation: Sinus rhythm Prominent P waves, nondiagnostic Borderline T abnormalities, lateral leads No significant change since last tracing Confirmed by Celesta Coke ([id number]) on 01/09/2024 6:11:23 PM  Radiology CT Head Wo Contrast Result Date: 01/09/2024 CLINICAL DATA:  Fall, trauma EXAM: CT HEAD WITHOUT CONTRAST CT CERVICAL SPINE WITHOUT CONTRAST TECHNIQUE: Multidetector CT imaging of the head and cervical spine was performed following the standard protocol without intravenous contrast. Multiplanar CT image reconstructions of the cervical spine were also generated. RADIATION DOSE REDUCTION: This exam was performed according to the departmental dose-optimization program which includes automated exposure control, adjustment of the mA and/or kV  according to patient size and/or use of iterative reconstruction technique. COMPARISON:  04/08/2021 FINDINGS: CT HEAD FINDINGS Brain: No evidence of acute infarction, hemorrhage, hydrocephalus, extra-axial collection or mass lesion/mass effect. Periventricular and deep white matter hypodensity. Multiple lacunar infarctions of the basal ganglia, corona radiata, and thalami as well as the cerebellar hemisphere series. Vascular: No hyperdense vessel or unexpected calcification. Skull: Normal. Negative for fracture or focal lesion. Sinuses/Orbits: No acute finding. Other: None. CT CERVICAL SPINE FINDINGS Alignment: Degenerative straightening of the normal cervical lordosis. Skull base and vertebrae: No acute fracture. No primary bone lesion or focal pathologic process. Soft tissues and spinal canal: No  prevertebral fluid or swelling. No visible canal hematoma. Disc levels: Mild-to-moderate multilevel cervical disc degenerative disease, worst from C5-C7 Upper chest: Negative. Other: None. IMPRESSION: 1. No acute intracranial pathology. Small-vessel white matter disease and multiple lacunar infarctions. 2. No fracture or static subluxation of the cervical spine. 3. Mild-to-moderate multilevel cervical disc degenerative disease, worst from C5-C7. Electronically Signed   By: Fredricka Jenny M.D.   On: 01/09/2024 19:38   CT Cervical Spine Wo Contrast Result Date: 01/09/2024 CLINICAL DATA:  Fall, trauma EXAM: CT HEAD WITHOUT CONTRAST CT CERVICAL SPINE WITHOUT CONTRAST TECHNIQUE: Multidetector CT imaging of the head and cervical spine was performed following the standard protocol without intravenous contrast. Multiplanar CT image reconstructions of the cervical spine were also generated. RADIATION DOSE REDUCTION: This exam was performed according to the departmental dose-optimization program which includes automated exposure control, adjustment of the mA and/or kV according to patient size and/or use of iterative  reconstruction technique. COMPARISON:  04/08/2021 FINDINGS: CT HEAD FINDINGS Brain: No evidence of acute infarction, hemorrhage, hydrocephalus, extra-axial collection or mass lesion/mass effect. Periventricular and deep white matter hypodensity. Multiple lacunar infarctions of the basal ganglia, corona radiata, and thalami as well as the cerebellar hemisphere series. Vascular: No hyperdense vessel or unexpected calcification. Skull: Normal. Negative for fracture or focal lesion. Sinuses/Orbits: No acute finding. Other: None. CT CERVICAL SPINE FINDINGS Alignment: Degenerative straightening of the normal cervical lordosis. Skull base and vertebrae: No acute fracture. No primary bone lesion or focal pathologic process. Soft tissues and spinal canal: No prevertebral fluid or swelling. No visible canal hematoma. Disc levels: Mild-to-moderate multilevel cervical disc degenerative disease, worst from C5-C7 Upper chest: Negative. Other: None. IMPRESSION: 1. No acute intracranial pathology. Small-vessel white matter disease and multiple lacunar infarctions. 2. No fracture or static subluxation of the cervical spine. 3. Mild-to-moderate multilevel cervical disc degenerative disease, worst from C5-C7. Electronically Signed   By: Fredricka Jenny M.D.   On: 01/09/2024 19:38   DG Hip Unilat With Pelvis 2-3 Views Right Result Date: 01/09/2024 CLINICAL DATA:  Pain after fall EXAM: DG HIP (WITH OR WITHOUT PELVIS) 3V RIGHT COMPARISON:  None Available. FINDINGS: There is a fracture of the right inferior pubic ramus. No additional fracture or dislocation. Preserved joint spaces. Osteopenia. Vascular calcifications in the pelvis. Degenerative changes of the visualized portions of the lumbar spine. Overlapping cardiac leads. If there is further concern of hip fracture additional cross-sectional imaging study could be considered as clinically appropriate further delineation in the setting of osteopenia. IMPRESSION: Fracture of the right  inferior pubic ramus.  Osteopenia. Electronically Signed   By: Adrianna Horde M.D.   On: 01/09/2024 19:18   DG Chest Port 1 View Result Date: 01/09/2024 CLINICAL DATA:  Pain after fall EXAM: PORTABLE CHEST 1 VIEW COMPARISON:  X-ray 08/30/2022. FINDINGS: Hyperinflation. No consolidation, pneumothorax or effusion. Normal cardiopericardial silhouette with a calcified aorta. Apical pleural thickening. Overlapping cardiac leads. The left inferior costophrenic angle is clipped off the edge of the film. Degenerative changes along the spine. If there is further concern of the sequela of trauma additional CT workup as clinically appropriate for further delineation. IMPRESSION: Hyperinflation.  Chronic changes.  No acute cardiopulmonary disease. Electronically Signed   By: Adrianna Horde M.D.   On: 01/09/2024 19:17    Procedures Procedures    Medications Ordered in ED Medications  acetaminophen  (TYLENOL ) tablet 650 mg (has no administration in time range)    ED Course/ Medical Decision Making/ A&P Clinical Course as of 01/09/24 2032  Paulene Boron Jan 09, 2024  1922 Hip XR shows fracture of R inferior pubic ramus. [VK]  1940 Cr at baseline, normal CK. Mild leukocytosis. UA is pending. [VK]  1941 No acute traumatic injury on CT imaging. [VK]  1953 Patient's UA is pending at this time. States she normally walks independently and was having too much pain to walk at home. Will plan for admission for pain control and PT eval. [VK]  2032 Rare bacteria, otherwise urine negative. Low suspicion for infection. [VK]    Clinical Course User Index [VK] Kingsley, Deundra Furber K, DO                                 Medical Decision Making This patient presents to the ED with chief complaint(s) of fall with pertinent past medical history of vascular dementia, CVA with mild aphasia at baseline, hypertension which further complicates the presenting complaint. The complaint involves an extensive differential diagnosis and also  carries with it a high risk of complications and morbidity.    The differential diagnosis includes due to patient's age and fall with dementia, concern for ICH, mass effect, cervical spine fracture, pelvis fracture, hip fracture, concern for possible syncopal fall, arrhythmia, anemia, dehydration, infection, electrolyte abnormality, possible rhabdo with her prolonged downtime  Additional history obtained: Additional history obtained from family and EMS  Records reviewed outpatient neurology records  ED Course and Reassessment: On patient's arrival she is hemodynamically stable in no acute distress.  She does have tenderness over pelvis and some abrasions to her back, no other traumatic injury seen on exam.  Did report dizziness prior to the fall concerning for possible syncopal fall.  Patient will have head CT, C-spine, chest and pelvis x-ray as well as labs including urine.  EKG on arrival showed normal sinus rhythm without acute ischemic changes.  She declined any pain medication at this time and will be closely reassessed.  Independent labs interpretation:  The following labs were independently interpreted: mild leukocytosis, otherwise within normal range, UA pending  Independent visualization of imaging: - I independently visualized the following imaging with scope of interpretation limited to determining acute life threatening conditions related to emergency care: CTH/C-spine, CXR, R hip XR, which revealed R pubic ramus fx, no other traumatic injury  Consultation: - Consulted or discussed management/test interpretation w/ external professional: hospitalist  Consideration for admission or further workup: patient requires admission for management of her fx Social Determinants of health: N/A    Amount and/or Complexity of Data Reviewed Labs: ordered. Radiology: ordered.  Risk OTC drugs. Decision regarding hospitalization.          Final Clinical Impression(s) / ED  Diagnoses Final diagnoses:  Closed fracture of ramus of right pubis, initial encounter [facility])  Fall, initial encounter    Rx / DC Orders ED Discharge Orders     None         Kingsley, Theone Bowell K, DO 01/09/24 2003

## 2024-01-09 NOTE — Assessment & Plan Note (Signed)
Continue Plavix 75 mg po q day

## 2024-01-09 NOTE — ED Triage Notes (Signed)
 Pt to er room number 25 via PT[facility], per ems pt fell about 5 hrs ago and is c/o pain in her L hip.  States that pt lives at home and is normally confused.  Pt states that she fell against the wall in the hall.  Pt c/o buttocks pain.

## 2024-01-10 DIAGNOSIS — S32509A Unspecified fracture of unspecified pubis, initial encounter for closed fracture: Secondary | ICD-10-CM | POA: Diagnosis not present

## 2024-01-10 LAB — COMPREHENSIVE METABOLIC PANEL WITH GFR
ALT: 9 U/L (ref 0–44)
AST: 19 U/L (ref 15–41)
Albumin: 2.9 g/dL — ABNORMAL LOW (ref 3.5–5.0)
Alkaline Phosphatase: 31 U/L — ABNORMAL LOW (ref 38–126)
Anion gap: 4 — ABNORMAL LOW (ref 5–15)
BUN: 18 mg/dL (ref 8–23)
CO2: 29 mmol/L (ref 22–32)
Calcium: 8.3 mg/dL — ABNORMAL LOW (ref 8.9–10.3)
Chloride: 105 mmol/L (ref 98–111)
Creatinine, Ser: 1.23 mg/dL — ABNORMAL HIGH (ref 0.44–1.00)
GFR, Estimated: 43 mL/min — ABNORMAL LOW (ref >60)
Glucose, Bld: 103 mg/dL — ABNORMAL HIGH (ref 70–99)
Potassium: 3.7 mmol/L (ref 3.5–5.1)
Sodium: 138 mmol/L (ref 135–145)
Total Bilirubin: 1.2 mg/dL (ref 0.0–1.2)
Total Protein: 5.3 g/dL — ABNORMAL LOW (ref 6.5–8.1)

## 2024-01-10 LAB — CBC
HCT: 36.2 % (ref 36.0–46.0)
Hemoglobin: 12.1 g/dL (ref 12.0–15.0)
MCH: 29.3 pg (ref 26.0–34.0)
MCHC: 33.4 g/dL (ref 30.0–36.0)
MCV: 87.7 fL (ref 80.0–100.0)
Platelets: 136 10*3/uL — ABNORMAL LOW (ref 150–400)
RBC: 4.13 MIL/uL (ref 3.87–5.11)
RDW: 13.2 % (ref 11.5–15.5)
WBC: 7.5 10*3/uL (ref 4.0–10.5)
nRBC: 0 % (ref 0.0–0.2)

## 2024-01-10 LAB — PHOSPHORUS: Phosphorus: 2.9 mg/dL (ref 2.5–4.6)

## 2024-01-10 LAB — MAGNESIUM: Magnesium: 1.9 mg/dL (ref 1.7–2.4)

## 2024-01-10 MED ORDER — VITAMIN D 25 MCG (1000 UNIT) PO TABS
1000.0000 [IU] | ORAL_TABLET | Freq: Every day | ORAL | Status: DC
  Administered 2024-01-10 – 2024-01-14 (×5): 1000 [IU] via ORAL
  Filled 2024-01-10 (×5): qty 1

## 2024-01-10 MED ORDER — ADULT MULTIVITAMIN W/MINERALS CH
1.0000 | ORAL_TABLET | Freq: Every day | ORAL | Status: DC
  Administered 2024-01-10 – 2024-01-14 (×5): 1 via ORAL
  Filled 2024-01-10 (×5): qty 1

## 2024-01-10 MED ORDER — ALBUTEROL SULFATE (2.5 MG/3ML) 0.083% IN NEBU: 3.0000 mL | INHALATION_SOLUTION | RESPIRATORY_TRACT | Status: DC | PRN

## 2024-01-10 MED ORDER — LOSARTAN POTASSIUM 50 MG PO TABS
50.0000 mg | ORAL_TABLET | Freq: Every day | ORAL | Status: DC
  Administered 2024-01-10 – 2024-01-12 (×3): 50 mg via ORAL
  Filled 2024-01-10 (×3): qty 1

## 2024-01-10 MED ORDER — ENOXAPARIN SODIUM 30 MG/0.3ML IJ SOSY
30.0000 mg | PREFILLED_SYRINGE | INTRAMUSCULAR | Status: DC
  Administered 2024-01-10 – 2024-01-11 (×2): 30 mg via SUBCUTANEOUS
  Filled 2024-01-10 (×2): qty 0.3

## 2024-01-10 MED ORDER — ENSURE ENLIVE PO LIQD
237.0000 mL | Freq: Two times a day (BID) | ORAL | Status: DC
  Administered 2024-01-10 – 2024-01-13 (×7): 237 mL via ORAL

## 2024-01-10 NOTE — Evaluation (Signed)
 Physical Therapy Evaluation Patient Details Name: Crystal Day MRN: [id number] DOB: 13-Aug-1935 Today's Date: 01/10/2024  History of Present Illness  89 y.o. female presents to [facility] hospital on 01/09/2024 after a fall. Imaging with findings of R inferior pubic ramus fx. PMH includes aphasia, vascular dementia, CVA, hypertension, anxiety, HTN, HLD.  Clinical Impression  Pt presents to PT with deficits in functional mobility, strength, power, balance, cognition, endurance. Pt requires physical assistance to perform bed mobility and to transfer out of bed at this time, with poor tolerance for weightbearing through RLE due to pain. Pt often demonstrates a posterior lean in sitting and standing, but is able to correct some with verbal and visual cueing. Pt remains at a high risk for falls and has insufficient caregiver support available to return to [facility] currently. Patient will benefit from continued inpatient follow up therapy, <3 hours/day. Acute PT to follow.        If plan is discharge home, recommend the following: A lot of help with walking and/or transfers;A lot of help with bathing/dressing/bathroom;Assistance with cooking/housework;Direct supervision/assist for medications management;Direct supervision/assist for financial management;Assist for transportation;Help with stairs or ramp for entrance;Supervision due to cognitive status   Can travel by private vehicle   No    Equipment Recommendations Wheelchair (measurements PT) (TBD pending progress)  Recommendations for Other Services       Functional Status Assessment Patient has had a recent decline in their functional status and demonstrates the ability to make significant improvements in function in a reasonable and predictable amount of time.     Precautions / Restrictions Precautions Precautions: Fall Restrictions Weight Bearing Restrictions Per Provider Order: Yes RLE Weight Bearing Per Provider Order: Weight bearing as  tolerated Other Position/Activity Restrictions: no documented WB restrictions. OT received verbal order from Dr. Lilyan Remedies this AM for WBAT      Mobility  Bed Mobility Overal bed mobility: Needs Assistance Bed Mobility: Supine to Sit     Supine to sit: Mod assist, HOB elevated, Used rails          Transfers Overall transfer level: Needs assistance Equipment used: Rolling walker (2 wheels) Transfers: Sit to/from Stand, Bed to chair/wheelchair/BSC Sit to Stand: Min assist Stand pivot transfers: Min assist         General transfer comment: verbal cues for hand placement, tactile cueing to facilitate trunk flexion, assist to power up into standing. tendence for posterior lean    Ambulation/Gait                  Stairs            Wheelchair Mobility     Tilt Bed    Modified Rankin (Stroke Patients Only)       Balance Overall balance assessment: Needs assistance Sitting-balance support: No upper extremity supported, Feet supported Sitting balance-Leahy Scale: Poor Sitting balance - Comments: posterior lean initially   Standing balance support: Bilateral upper extremity supported Standing balance-Leahy Scale: Poor Standing balance comment: posterior lean                             Pertinent Vitals/Pain Pain Assessment Pain Assessment: Faces Faces Pain Scale: Hurts whole lot Pain Location: pelvis Pain Descriptors / Indicators: Grimacing Pain Intervention(s): Monitored during session    Home Living Family/patient expects to be discharged to:: Skilled nursing facility  Additional Comments: pt lives in Independent Living at [facility].    Prior Function Prior Level of Function : Needs assist             Mobility Comments: ambulatory without DME ADLs Comments: pt has assistance for transportation and for cooking, can microwave or prepare cold meals for herself     Extremity/Trunk Assessment   Upper  Extremity Assessment Upper Extremity Assessment: Defer to OT evaluation    Lower Extremity Assessment Lower Extremity Assessment: Generalized weakness;RLE deficits/detail RLE Deficits / Details: grossly 3/5, pain limiting tolerance for mobility    Cervical / Trunk Assessment Cervical / Trunk Assessment: Normal  Communication   Communication Communication: No apparent difficulties    Cognition Arousal: Alert Behavior During Therapy: WFL for tasks assessed/performed   PT - Cognitive impairments: History of cognitive impairments                       PT - Cognition Comments: pt with dementia, unable to independently recall her DOB but does follow commands well during session Following commands: Intact       Cueing Cueing Techniques: Verbal cues, Tactile cues, Visual cues     General Comments General comments (skin integrity, edema, etc.): VSS on RA    Exercises     Assessment/Plan    PT Assessment Patient needs continued PT services  PT Problem List Decreased strength;Decreased range of motion;Decreased activity tolerance;Decreased balance;Decreased mobility;Decreased cognition;Decreased knowledge of use of DME;Decreased safety awareness;Decreased knowledge of precautions;Pain       PT Treatment Interventions DME instruction;Gait training;Functional mobility training;Therapeutic activities;Therapeutic exercise;Balance training;Neuromuscular re-education;Cognitive remediation;Patient/family education    PT Goals (Current goals can be found in the Care Plan section)  Acute Rehab PT Goals Patient Stated Goal: to return to prior level of function PT Goal Formulation: With patient/family Time For Goal Achievement: 01/24/24 Potential to Achieve Goals: Fair    Frequency Min 2X/week     Co-evaluation               AM-PAC PT "6 Clicks" Mobility  Outcome Measure Help needed turning from your back to your side while in a flat bed without using bedrails?: A  Lot Help needed moving from lying on your back to sitting on the side of a flat bed without using bedrails?: A Lot Help needed moving to and from a bed to a chair (including a wheelchair)?: A Lot Help needed standing up from a chair using your arms (e.g., wheelchair or bedside chair)?: A Lot Help needed to walk in hospital room?: Total Help needed climbing 3-5 steps with a railing? : Total 6 Click Score: 10    End of Session Equipment Utilized During Treatment: Gait belt Activity Tolerance: Patient tolerated treatment well Patient left: in chair;with call bell/phone within reach;with chair alarm set;with family/visitor present Nurse Communication: Mobility status PT Visit Diagnosis: Other abnormalities of gait and mobility (R26.89);Muscle weakness (generalized) (M62.81);Pain Pain - Right/Left: Right Pain - part of body: Hip    Time: 1352-1415 PT Time Calculation (min) (ACUTE ONLY): 23 min   Charges:   PT Evaluation $PT Eval Low Complexity: 1 Low   PT General Charges $$ ACUTE PT VISIT: 1 Visit         Rexie Catena, PT, DPT Acute Rehabilitation Office [phone]   Rexie Catena 01/10/2024, 3:17 PM

## 2024-01-10 NOTE — Discharge Instructions (Signed)

## 2024-01-10 NOTE — Progress Notes (Addendum)
 Initial Nutrition Assessment  DOCUMENTATION CODES:   Not applicable  INTERVENTION:   Encourage PO intake Room service with assist  Liberalize diet to promote PO intake Assist with feeding pt during meal times Ensure Enlive po BID, each supplement provides 350 kcal and 20 grams of protein. MVI with minerals daily 1,000 units Vitamin D  daily  High calorie, high protein handout in AVS  NUTRITION DIAGNOSIS:   Increased nutrient needs related to hip fracture as evidenced by estimated needs.   GOAL:   Patient will meet greater than or equal to 90% of their needs   MONITOR:   PO intake, Supplement acceptance  REASON FOR ASSESSMENT:   Consult Assessment of nutrition requirement/status  ASSESSMENT:  89 y.o female with PMH of aphasia, dementia, CVA, HTN, anxiety, HLD, CKD IIIa. Presents from assisted living with a fall. Found to have R inferior pubic ramus fx.  RD working remotely. History obtained through chart review.   Per H&P pt denies loss of appetite or weight loss. Reviewed weight history and weight has been trending down since 2021 however, relatively stable in the last year. At baseline was walking independently.  Pt with 1 meal documented of 75% consumed. RD will add ONS for increased energy needs related to pt's fracture.   Awaiting PT/OT evaluation, likely needs SNF.   Admit weight: 61.2 kg  Current weight: 61.2 kg    Average Meal Intake: 5/26: 75% intake x 1 recorded meals  Nutritionally Relevant Medications: Scheduled Meds:  cholecalciferol  1,000 Units Oral Daily   clopidogrel   75 mg Oral Daily   enoxaparin  (LOVENOX ) injection  30 mg Subcutaneous Q24H   escitalopram  5 mg Oral Daily   feeding supplement  237 mL Oral BID BM   losartan  50 mg Oral Daily   metoprolol  succinate  25 mg Oral Daily   multivitamin with minerals  1 tablet Oral Daily    Labs Reviewed: Creatinine 1.23 Calcium  8.3 AP 31 Albumin 2.9 Total protein 5.3 GFR 43 Vitamin D   16.2  CBG ranges from 103-136 mg/dL over the last 24 hours HgbA1c 5.7 (2020)  NUTRITION - FOCUSED PHYSICAL EXAM: - Deferred to follow up   Diet Order:   Diet Order             Diet regular Room service appropriate? Yes with Assist; Fluid consistency: Thin  Diet effective 1400                   EDUCATION NEEDS:   Not appropriate for education at this time  Skin:  Skin Assessment: Reviewed RN Assessment  Last BM:  01/09/2024  Height:   Ht Readings from Last 1 Encounters:  01/09/24 5\' 10"  (1.778 m)    Weight:   Wt Readings from Last 1 Encounters:  01/09/24 61.2 kg    BMI:  Body mass index is 19.37 kg/m.  Estimated Nutritional Needs:   Kcal:  1600-1800 kcal  Protein:  80-100 gm  Fluid:  >1.6L/day   Frederik Jansky, RD Registered Dietitian  See Amion for more information

## 2024-01-10 NOTE — Progress Notes (Signed)
 Physical Therapy Treatment Patient Details Name: Crystal Day MRN: [id number] DOB: 04/27/1935 Today's Date: 01/10/2024   History of Present Illness 89 y.o. female presents to [facility] hospital on 01/09/2024 after a fall. Imaging with findings of R inferior pubic ramus fx. PMH includes aphasia, vascular dementia, CVA, hypertension, anxiety, HTN, HLD.    PT Comments  PT returns to assist pt from chair back to bed. Pt demonstrates improved initiation of trunk flexion during tranfers, requiring reduced physical assistance to ascend into standing. Pt remains limited by pain in R hip/groin. PT encourages continued frequent mobilization with staff assistance. Patient will benefit from continued inpatient follow up therapy, <3 hours/day.    If plan is discharge home, recommend the following: A lot of help with walking and/or transfers;A lot of help with bathing/dressing/bathroom;Assistance with cooking/housework;Direct supervision/assist for medications management;Direct supervision/assist for financial management;Assist for transportation;Help with stairs or ramp for entrance;Supervision due to cognitive status   Can travel by private vehicle     No  Equipment Recommendations  Wheelchair (measurements PT)    Recommendations for Other Services       Precautions / Restrictions Precautions Precautions: Fall Recall of Precautions/Restrictions: Impaired Restrictions Weight Bearing Restrictions Per Provider Order: Yes RLE Weight Bearing Per Provider Order: Weight bearing as tolerated Other Position/Activity Restrictions: no documented WB restrictions. OT received verbal order from Dr. Lilyan Remedies this AM for WBAT     Mobility  Bed Mobility Overal bed mobility: Needs Assistance Bed Mobility: Sit to Supine     Supine to sit: Mod assist, HOB elevated, Used rails Sit to supine: Mod assist, Used rails, HOB elevated        Transfers Overall transfer level: Needs assistance Equipment used: Rolling  walker (2 wheels) Transfers: Sit to/from Stand, Bed to chair/wheelchair/BSC Sit to Stand: Min assist Stand pivot transfers: Min assist Step pivot transfers: Min assist       General transfer comment: verbal cues for hand placement, tactile cueing to facilitate trunk flexion, assist to power up into standing. tendence for posterior lean    Ambulation/Gait                   Stairs             Wheelchair Mobility     Tilt Bed    Modified Rankin (Stroke Patients Only)       Balance Overall balance assessment: Needs assistance Sitting-balance support: No upper extremity supported, Feet supported Sitting balance-Leahy Scale: Fair Sitting balance - Comments: posterior lean initially   Standing balance support: Bilateral upper extremity supported, Reliant on assistive device for balance Standing balance-Leahy Scale: Poor Standing balance comment: posterior lean                            Communication Communication Communication: No apparent difficulties  Cognition Arousal: Alert Behavior During Therapy: WFL for tasks assessed/performed   PT - Cognitive impairments: History of cognitive impairments                       PT - Cognition Comments: pt with dementia, unable to independently recall her DOB but does follow commands well during session Following commands: Intact      Cueing Cueing Techniques: Verbal cues, Tactile cues, Visual cues  Exercises      General Comments General comments (skin integrity, edema, etc.): VSS on RA      Pertinent Vitals/Pain Pain Assessment Pain Assessment: Faces Faces Pain  Scale: Hurts whole lot Pain Location: pelvis Pain Descriptors / Indicators: Grimacing Pain Intervention(s): Limited activity within patient's tolerance    Home Living Family/patient expects to be discharged to:: Skilled nursing facility                   Additional Comments: pt lives in Independent Living at  [facility].    Prior Function            PT Goals (current goals can now be found in the care plan section) Acute Rehab PT Goals Patient Stated Goal: to return to prior level of function PT Goal Formulation: With patient/family Time For Goal Achievement: 01/24/24 Potential to Achieve Goals: Fair Progress towards PT goals: Progressing toward goals    Frequency    Min 2X/week      PT Plan      Co-evaluation              AM-PAC PT "6 Clicks" Mobility   Outcome Measure  Help needed turning from your back to your side while in a flat bed without using bedrails?: A Lot Help needed moving from lying on your back to sitting on the side of a flat bed without using bedrails?: A Lot Help needed moving to and from a bed to a chair (including a wheelchair)?: A Little Help needed standing up from a chair using your arms (e.g., wheelchair or bedside chair)?: A Little Help needed to walk in hospital room?: A Lot Help needed climbing 3-5 steps with a railing? : Total 6 Click Score: 13    End of Session Equipment Utilized During Treatment: Gait belt Activity Tolerance: Patient tolerated treatment well Patient left: in bed;with call bell/phone within reach;with bed alarm set;with family/visitor present Nurse Communication: Mobility status PT Visit Diagnosis: Other abnormalities of gait and mobility (R26.89);Muscle weakness (generalized) (M62.81);Pain Pain - Right/Left: Right Pain - part of body: Hip     Time: 1610-9604 PT Time Calculation (min) (ACUTE ONLY): 12 min  Charges:    $Therapeutic Activity: 8-22 mins PT General Charges $$ ACUTE PT VISIT: 1 Visit                     Rexie Catena, PT, DPT Acute Rehabilitation Office [phone]    Rexie Catena 01/10/2024, 5:19 PM

## 2024-01-10 NOTE — TOC CAGE-AID Note (Signed)
 Transition of Care [facility]) - CAGE-AID Screening   Patient Details  Name: Crystal Day MRN: [id number] Date of Birth: 10-Apr-1935  [facility] CM/SW Contact:    [name] [name], LCSW Phone Number: 01/10/2024, 11:06 AM   Clinical Narrative:    CAGE-AID Screening: Substance Abuse Screening unable to be completed due to: : Patient unable to participate (dementia)

## 2024-01-10 NOTE — Progress Notes (Signed)
 PROGRESS NOTE    Crystal Day  ZOX:[id number] DOB: 1934-09-08 DOA: 01/09/2024 PCP: Faustina Hood, MD   Brief Narrative:  Crystal Day is a 89 y.o. female with medical history significant of aphasia, vascular dementia, CVA, hypertension, anxiety, HTN, HLD who was brought into the ED after a fall at assisted living facility, she was too weak to get up from the toilet, felt dizzy prior to fall.  At baseline, walks independently.  Denies hitting head or LOC. Family feels her feet got tangled up as soon as the family left no syncope.   Assessment & Plan:   Principal Problem:   Pubic bone fracture (HCC) Active Problems:   CKD (chronic kidney disease) stage 3, GFR 30-59 ml/min (HCC)   Aphasia   History of CVA (cerebrovascular accident)   Vascular dementia (HCC)  Fall/generalized weakness/right pubic ramus fracture: Pain controlled.  PT OT assessment pending.  Will likely require SNF.  Essential hypertension: Slightly elevated.  Resume losartan.  Per med rec done by pharmacy, patient is not taking metoprolol .  History of CVA leading to aphasia: Continue Plavix .  Vascular dementia: Stable.  Avoid delirium.  CKD stage IIIb: Baseline creatinine around 1.2 and currently at baseline.  DVT prophylaxis: SCDs Start: 01/09/24 2144   Code Status: Full Code  Family Communication:  None present at bedside.  Plan of care discussed with patient in length and he/she verbalized understanding and agreed with it.  Status is: Inpatient Remains inpatient appropriate because: Pending PT OT evaluation, will likely require SNF.   Estimated body mass index is 19.37 kg/m as calculated from the following:   Height as of this encounter: 5\' 10"  (1.778 m).   Weight as of this encounter: 61.2 kg.    Nutritional Assessment: Body mass index is 19.37 kg/m.Aaron Aas Seen by dietician.  I agree with the assessment and plan as outlined below: Nutrition Status:        . Skin Assessment: I have examined the  patient's skin and I agree with the wound assessment as performed by the wound care RN as outlined below:    Consultants:  None  Procedures:  None  Antimicrobials:  Anti-infectives (From admission, onward)    None         Subjective: Patient seen and examined, she is complaining of pubic pain which is mild but otherwise no complaints.  Patient is very pleasant and she is alert and oriented to self and place.  Objective: Vitals:   01/09/24 2003 01/09/24 2100 01/09/24 2205 01/10/24 0345  BP:  (!) 145/70 (!) 143/72 (!) 144/66  Pulse: 90 72 63 68  Resp: 16 18 17 18   Temp:  98.1 F (36.7 C) 98.2 F (36.8 C) 98.6 F (37 C)  TempSrc:  Oral Oral Oral  SpO2: 93% 100% 97% 96%  Weight:      Height:        Intake/Output Summary (Last 24 hours) at 01/10/2024 0833 Last data filed at 01/10/2024 0500 Gross per 24 hour  Intake 516.01 ml  Output 700 ml  Net -183.99 ml   Filed Weights   01/09/24 1802  Weight: 61.2 kg    Examination:  General exam: Appears calm and comfortable  Respiratory system: Clear to auscultation. Respiratory effort normal. Cardiovascular system: S1 & S2 heard, RRR. No JVD, murmurs, rubs, gallops or clicks. No pedal edema. Gastrointestinal system: Abdomen is nondistended, soft and nontender. No organomegaly or masses felt. Normal bowel sounds heard. Central nervous system: Alert and oriented x 2. No  focal neurological deficits. Extremities: Symmetric 5 x 5 power. Skin: No rashes, lesions or ulcers  Data Reviewed: I have personally reviewed following labs and imaging studies  CBC: Recent Labs  Lab 01/09/24 1836 01/10/24 0644  WBC 11.0* 7.5  NEUTROABS 9.9*  --   HGB 14.5 12.1  HCT 44.4 36.2  MCV 88.4 87.7  PLT 156 136*   Basic Metabolic Panel: Recent Labs  Lab 01/09/24 1836 01/10/24 0644  NA 140 138  K 4.0 3.7  CL 102 105  CO2 26 29  GLUCOSE 136* 103*  BUN 19 18  CREATININE 1.22* 1.23*  CALCIUM  9.5 8.3*  MG  --  1.9  PHOS  --  2.9    GFR: Estimated Creatinine Clearance: 31.1 mL/min (A) (by C-G formula based on SCr of 1.23 mg/dL (H)). Liver Function Tests: Recent Labs  Lab 01/09/24 1836 01/10/24 0644  AST 29 19  ALT 14 9  ALKPHOS 40 31*  BILITOT 1.0 1.2  PROT 6.6 5.3*  ALBUMIN 3.6 2.9*   No results for input(s): "LIPASE", "AMYLASE" in the last 168 hours. No results for input(s): "AMMONIA" in the last 168 hours. Coagulation Profile: No results for input(s): "INR", "PROTIME" in the last 168 hours. Cardiac Enzymes: Recent Labs  Lab 01/09/24 1836  CKTOTAL 130   BNP (last 3 results) No results for input(s): "PROBNP" in the last 8760 hours. HbA1C: No results for input(s): "HGBA1C" in the last 72 hours. CBG: Recent Labs  Lab 01/09/24 1832  GLUCAP 147*   Lipid Profile: No results for input(s): "CHOL", "HDL", "LDLCALC", "TRIG", "CHOLHDL", "LDLDIRECT" in the last 72 hours. Thyroid  Function Tests: No results for input(s): "TSH", "T4TOTAL", "FREET4", "T3FREE", "THYROIDAB" in the last 72 hours. Anemia Panel: No results for input(s): "VITAMINB12", "FOLATE", "FERRITIN", "TIBC", "IRON", "RETICCTPCT" in the last 72 hours. Sepsis Labs: No results for input(s): "PROCALCITON", "LATICACIDVEN" in the last 168 hours.  No results found for this or any previous visit (from the past 240 hours).   Radiology Studies: CT Head Wo Contrast Result Date: 01/09/2024 CLINICAL DATA:  Fall, trauma EXAM: CT HEAD WITHOUT CONTRAST CT CERVICAL SPINE WITHOUT CONTRAST TECHNIQUE: Multidetector CT imaging of the head and cervical spine was performed following the standard protocol without intravenous contrast. Multiplanar CT image reconstructions of the cervical spine were also generated. RADIATION DOSE REDUCTION: This exam was performed according to the departmental dose-optimization program which includes automated exposure control, adjustment of the mA and/or kV according to patient size and/or use of iterative reconstruction technique.  COMPARISON:  04/08/2021 FINDINGS: CT HEAD FINDINGS Brain: No evidence of acute infarction, hemorrhage, hydrocephalus, extra-axial collection or mass lesion/mass effect. Periventricular and deep white matter hypodensity. Multiple lacunar infarctions of the basal ganglia, corona radiata, and thalami as well as the cerebellar hemisphere series. Vascular: No hyperdense vessel or unexpected calcification. Skull: Normal. Negative for fracture or focal lesion. Sinuses/Orbits: No acute finding. Other: None. CT CERVICAL SPINE FINDINGS Alignment: Degenerative straightening of the normal cervical lordosis. Skull base and vertebrae: No acute fracture. No primary bone lesion or focal pathologic process. Soft tissues and spinal canal: No prevertebral fluid or swelling. No visible canal hematoma. Disc levels: Mild-to-moderate multilevel cervical disc degenerative disease, worst from C5-C7 Upper chest: Negative. Other: None. IMPRESSION: 1. No acute intracranial pathology. Small-vessel white matter disease and multiple lacunar infarctions. 2. No fracture or static subluxation of the cervical spine. 3. Mild-to-moderate multilevel cervical disc degenerative disease, worst from C5-C7. Electronically Signed   By: Fredricka Jenny M.D.   On:  01/09/2024 19:38   CT Cervical Spine Wo Contrast Result Date: 01/09/2024 CLINICAL DATA:  Fall, trauma EXAM: CT HEAD WITHOUT CONTRAST CT CERVICAL SPINE WITHOUT CONTRAST TECHNIQUE: Multidetector CT imaging of the head and cervical spine was performed following the standard protocol without intravenous contrast. Multiplanar CT image reconstructions of the cervical spine were also generated. RADIATION DOSE REDUCTION: This exam was performed according to the departmental dose-optimization program which includes automated exposure control, adjustment of the mA and/or kV according to patient size and/or use of iterative reconstruction technique. COMPARISON:  04/08/2021 FINDINGS: CT HEAD FINDINGS Brain: No  evidence of acute infarction, hemorrhage, hydrocephalus, extra-axial collection or mass lesion/mass effect. Periventricular and deep white matter hypodensity. Multiple lacunar infarctions of the basal ganglia, corona radiata, and thalami as well as the cerebellar hemisphere series. Vascular: No hyperdense vessel or unexpected calcification. Skull: Normal. Negative for fracture or focal lesion. Sinuses/Orbits: No acute finding. Other: None. CT CERVICAL SPINE FINDINGS Alignment: Degenerative straightening of the normal cervical lordosis. Skull base and vertebrae: No acute fracture. No primary bone lesion or focal pathologic process. Soft tissues and spinal canal: No prevertebral fluid or swelling. No visible canal hematoma. Disc levels: Mild-to-moderate multilevel cervical disc degenerative disease, worst from C5-C7 Upper chest: Negative. Other: None. IMPRESSION: 1. No acute intracranial pathology. Small-vessel white matter disease and multiple lacunar infarctions. 2. No fracture or static subluxation of the cervical spine. 3. Mild-to-moderate multilevel cervical disc degenerative disease, worst from C5-C7. Electronically Signed   By: Fredricka Jenny M.D.   On: 01/09/2024 19:38   DG Hip Unilat With Pelvis 2-3 Views Right Result Date: 01/09/2024 CLINICAL DATA:  Pain after fall EXAM: DG HIP (WITH OR WITHOUT PELVIS) 3V RIGHT COMPARISON:  None Available. FINDINGS: There is a fracture of the right inferior pubic ramus. No additional fracture or dislocation. Preserved joint spaces. Osteopenia. Vascular calcifications in the pelvis. Degenerative changes of the visualized portions of the lumbar spine. Overlapping cardiac leads. If there is further concern of hip fracture additional cross-sectional imaging study could be considered as clinically appropriate further delineation in the setting of osteopenia. IMPRESSION: Fracture of the right inferior pubic ramus.  Osteopenia. Electronically Signed   By: Adrianna Horde M.D.   On:  01/09/2024 19:18   DG Chest Port 1 View Result Date: 01/09/2024 CLINICAL DATA:  Pain after fall EXAM: PORTABLE CHEST 1 VIEW COMPARISON:  X-ray 08/30/2022. FINDINGS: Hyperinflation. No consolidation, pneumothorax or effusion. Normal cardiopericardial silhouette with a calcified aorta. Apical pleural thickening. Overlapping cardiac leads. The left inferior costophrenic angle is clipped off the edge of the film. Degenerative changes along the spine. If there is further concern of the sequela of trauma additional CT workup as clinically appropriate for further delineation. IMPRESSION: Hyperinflation.  Chronic changes.  No acute cardiopulmonary disease. Electronically Signed   By: Adrianna Horde M.D.   On: 01/09/2024 19:17    Scheduled Meds:  clopidogrel   75 mg Oral Daily   escitalopram  5 mg Oral Daily   metoprolol  succinate  25 mg Oral Daily   Continuous Infusions:   LOS: 1 day   Modena Andes, MD [facility]  01/10/2024, 8:33 AM   *Please note that this is a verbal dictation therefore any spelling or grammatical errors are due to the "[employer] One" system interpretation.  Please page via Amion and do not message via secure chat for urgent patient care matters. Secure chat can be used for non urgent patient care matters.  How to contact the [facility] Attending or Consulting  provider 7A - 7P or covering provider during after hours 7P -7A, for this patient?  Check the care team in [facility] and look for a) attending/consulting [facility] provider listed and b) the [facility] team listed. Page or secure chat 7A-7P. Log into [email or web address] and use 's universal password to access. If you do not have the password, please contact the hospital operator. Locate the [facility] provider you are looking for under [facility] and page to a number that you can be directly reached. If you still have difficulty reaching the provider, please page the [facility] (Director on Call) for the Hospitalists listed on amion for  assistance.

## 2024-01-10 NOTE — Progress Notes (Signed)
 OT Cancellation Note  Patient Details Name: AMANII SNETHEN MRN: [id number] DOB: 01-03-1935   Cancelled Treatment:    Reason Eval/Treat Not Completed: Other (comment) (waiting weight bearing clarification) Attempting to contact MD to get clarification of weight bearing precautions / ortho consult so that therapy can evaluate the patient for the R pubic ramus fx.   Neomia Banner 01/10/2024, 9:26 AM

## 2024-01-10 NOTE — Evaluation (Signed)
 Occupational Therapy Evaluation Patient Details Name: Crystal Day MRN: [id number] DOB: 02/24/1935 Today's Date: 01/10/2024   History of Present Illness   89 y.o. female presents to [facility] on 01/09/2024 after a fall. Imaging with findings of R inferior pubic ramus fx. PMH includes aphasia, vascular dementia, CVA, hypertension, anxiety, HTN, HLD.     Clinical Impressions PT admitted with R inferior pubic ramus fx non operative. Pt currently with functional limitiations due to the deficits listed below (see OT problem list). Pt living in indep living facility prior to admission. Pt at this time requires (A) for all mobility (bed, bed<>Bsc) Pt could benefit from some continued rehab prior to d/c.  Pt will benefit from skilled OT to increase their independence and safety with adls and balance to allow discharge skilled inpatient follow up therapy, <3 hours/day. .    If plan is discharge home, recommend the following:   Two people to help with walking and/or transfers;Two people to help with bathing/dressing/bathroom     Functional Status Assessment   Patient has had a recent decline in their functional status and demonstrates the ability to make significant improvements in function in a reasonable and predictable amount of time.     Equipment Recommendations   Wheelchair cushion (measurements OT);Wheelchair (measurements OT);Hospital bed;BSC/3in1     Recommendations for Other Services         Precautions/Restrictions   Precautions Precautions: Fall Restrictions Weight Bearing Restrictions Per Provider Order: Yes RLE Weight Bearing Per Provider Order: Weight bearing as tolerated LLE Weight Bearing Per Provider Order: Weight bearing as tolerated Other Position/Activity Restrictions: Dr Lilyan Remedies verbal order WBAT and to complete evaluations 5/26 at 9:30am verbalized will place order with rounding.     Mobility Bed Mobility Overal bed mobility: Needs Assistance Bed  Mobility: Rolling, Supine to Sit, Sit to Supine Rolling: Mod assist   Supine to sit: Max assist Sit to supine: Max assist   General bed mobility comments: pt able to progress bil LE toward EOB. HOb >40 degrees using bil UE on bed rail and OT using pad able to pivot to eob sitting. pt with strong posterior lean initially. pt attempting to sit posteriorly R due to R discomfort in the pelvis. pt powered up into standing with weight shifted onto L LE. pt returning to supine required bil LE and pad to pivot onto bed surface    Transfers Overall transfer level: Needs assistance Equipment used: 1 person hand held assist Transfers: Sit to/from Stand Sit to Stand: Max assist Stand pivot transfers: Max assist         General transfer comment: pt benefits from transfers toward L side      Balance Overall balance assessment: Needs assistance   Sitting balance-Leahy Scale: Poor       Standing balance-Leahy Scale: Poor                             ADL either performed or assessed with clinical judgement   ADL Overall ADL's : Needs assistance/impaired Eating/Feeding: Modified independent;Bed level   Grooming: Modified independent;Bed level               Lower Body Dressing: Total assistance   Toilet Transfer: Maximal assistance;Stand-pivot;BSC/3in1 Toilet Transfer Details (indicate cue type and reason): unable to void           General ADL Comments: pt transfered from bed to [facility] and unable to void. pt return to supine in  bed per pt request. pt reports decreased pain in supin position.     Vision Baseline Vision/History: 0 No visual deficits       Perception         Praxis         Pertinent Vitals/Pain Pain Assessment Pain Assessment: Faces Faces Pain Scale: Hurts worst Pain Location: pelvis Pain Descriptors / Indicators: Grimacing, Discomfort, Guarding Pain Intervention(s): Monitored during session, Premedicated before session, Repositioned,  Limited activity within patient's tolerance (states it dont hurt if i dont move)     Extremity/Trunk Assessment Upper Extremity Assessment Upper Extremity Assessment: Overall WFL for tasks assessed   Lower Extremity Assessment Lower Extremity Assessment: Defer to PT evaluation   Cervical / Trunk Assessment Cervical / Trunk Assessment: Normal   Communication Communication Communication: No apparent difficulties   Cognition Arousal: Alert Behavior During Therapy: WFL for tasks assessed/performed Cognition: History of cognitive impairments             OT - Cognition Comments: pt able to verbalize being on the ground for extended time, currently at hospital but other details provided were not accurate.                 Following commands: Intact       Cueing  General Comments   Cueing Techniques: Gestural cues;Visual cues;Verbal cues  RA VSS   Exercises     Shoulder Instructions      Home Living Family/patient expects to be discharged to:: Skilled nursing facility                                        Prior Functioning/Environment Prior Level of Function : Independent/Modified Independent                    OT Problem List: Decreased activity tolerance;Impaired balance (sitting and/or standing);Decreased cognition;Decreased safety awareness;Decreased knowledge of use of DME or AE;Decreased knowledge of precautions;Cardiopulmonary status limiting activity;Pain   OT Treatment/Interventions: Self-care/ADL training;Therapeutic exercise;Energy conservation;DME and/or AE instruction;Manual therapy;Modalities;Therapeutic activities;Patient/family education;Balance training      OT Goals(Current goals can be found in the care plan section)   Acute Rehab OT Goals Patient Stated Goal: to lay down OT Goal Formulation: Patient unable to participate in goal setting Time For Goal Achievement: 01/24/24 Potential to Achieve Goals: Good   OT  Frequency:  Min 2X/week    Co-evaluation              AM-PAC OT "6 Clicks" Daily Activity     Outcome Measure Help from another person eating meals?: None Help from another person taking care of personal grooming?: None Help from another person toileting, which includes using toliet, bedpan, or urinal?: A Lot Help from another person bathing (including washing, rinsing, drying)?: A Lot Help from another person to put on and taking off regular upper body clothing?: A Little Help from another person to put on and taking off regular lower body clothing?: A Lot 6 Click Score: 17   End of Session Equipment Utilized During Treatment: Gait belt Nurse Communication: Mobility status;Precautions;Weight bearing status  Activity Tolerance: Patient tolerated treatment well Patient left: in bed;with call bell/phone within reach;with bed alarm set;with SCD's reapplied  OT Visit Diagnosis: Unsteadiness on feet (R26.81);Muscle weakness (generalized) (M62.81);Pain Pain - Right/Left: Right Pain - part of body:  (pelvis)  Time: 0932-1000 OT Time Calculation (min): 28 min Charges:  OT General Charges $OT Visit: 1 Visit OT Evaluation $OT Eval Moderate Complexity: 1 Mod OT Treatments $Self Care/Home Management : 8-22 mins   Crystal Day, OTR/L  Acute Rehabilitation Services Office: [phone] .   Crystal Day 01/10/2024, 10:03 AM

## 2024-01-11 DIAGNOSIS — S32509A Unspecified fracture of unspecified pubis, initial encounter for closed fracture: Secondary | ICD-10-CM | POA: Diagnosis not present

## 2024-01-11 LAB — BASIC METABOLIC PANEL WITH GFR
Anion gap: 6 (ref 5–15)
BUN: 20 mg/dL (ref 8–23)
CO2: 28 mmol/L (ref 22–32)
Calcium: 8.7 mg/dL — ABNORMAL LOW (ref 8.9–10.3)
Chloride: 106 mmol/L (ref 98–111)
Creatinine, Ser: 1.14 mg/dL — ABNORMAL HIGH (ref 0.44–1.00)
GFR, Estimated: 47 mL/min — ABNORMAL LOW (ref >60)
Glucose, Bld: 114 mg/dL — ABNORMAL HIGH (ref 70–99)
Potassium: 3.9 mmol/L (ref 3.5–5.1)
Sodium: 140 mmol/L (ref 135–145)

## 2024-01-11 MED ORDER — ENOXAPARIN SODIUM 40 MG/0.4ML IJ SOSY
40.0000 mg | PREFILLED_SYRINGE | INTRAMUSCULAR | Status: DC
  Administered 2024-01-12 – 2024-01-14 (×3): 40 mg via SUBCUTANEOUS
  Filled 2024-01-11 (×3): qty 0.4

## 2024-01-11 NOTE — Progress Notes (Signed)
       RE:  Crystal Day   Date of Birth:  [birth year]  Date:   01/11/2024    To Whom It May Concern:  Please be advised that the above-named patient will require a short-term nursing home stay - anticipated 30 days or less for rehabilitation and strengthening.  The plan is for return home.

## 2024-01-11 NOTE — TOC Progression Note (Signed)
 Transition of Care [facility]) - Progression Note    Patient Details  Name: WILLARD MADRIGAL MRN: [id number] Date of Birth: [birth year]  [facility]) CM/SW Contact  Katrinka Parr, [place] Phone Number: 01/11/2024, 3:27 PM  Clinical Narrative:     Met with pt to discuss SNF recommendation. CSW explained medicare coverage and auth process. Pt agreeable to SNF. She does state that her daughter handles everything for her and consented ot CSW contacting daughter. Fl2 completed and bed requests sent in hub. Voicemail left with daughter requesting return call.   Expected Discharge Plan: Skilled Nursing Facility Barriers to Discharge: Insurance Authorization, SNF Pending bed offer                     Social Determinants of Health (SDOH) Interventions SDOH Screenings   Food Insecurity: No Food Insecurity (01/09/2024)  Housing: Low Risk  (01/09/2024)  Transportation Needs: No Transportation Needs (01/09/2024)  Utilities: At Risk (01/09/2024)  Social Connections: Moderately Integrated (01/09/2024)  Tobacco Use: Medium Risk (01/09/2024)    Readmission Risk Interventions     No data to display

## 2024-01-11 NOTE — Progress Notes (Signed)
 Mobility Specialist Progress Note:    01/11/24 1005  Mobility  Activity Transferred from bed to chair  Level of Assistance Moderate assist, patient does 50-74%  Assistive Device Front wheel walker  Distance Ambulated (ft) 3 ft  RLE Weight Bearing Per Provider Order WBAT  LLE Weight Bearing Per Provider Order WBAT  Activity Response Tolerated well  Mobility Referral Yes  Mobility visit 1 Mobility  Mobility Specialist Start Time (ACUTE ONLY) [id number]  Mobility Specialist Stop Time (ACUTE ONLY) 0957  Mobility Specialist Time Calculation (min) (ACUTE ONLY) 14 min   Pt received in bed agreeable to mobility. Pt required ModA for bed mobility and STS, contact guard for transfer. Pt was able to put some weight through her RLE but not a lot d/t strong pain. Pt was able to take shuffling steps towards the chair. Left in chair w/ call bell and personal belongings in reach. All needs met. Chair alarm on. RN and NT aware.  Inetta Manes Mobility Specialist  Please contact vis Secure Chat or  Rehab Office [phone]

## 2024-01-11 NOTE — Progress Notes (Signed)
 PROGRESS NOTE    Crystal Day  ZOX:[id number] DOB: 05/03/1935 DOA: 01/09/2024 PCP: Faustina Hood, MD   Brief Narrative:  Crystal Day is a 89 y.o. female with medical history significant of aphasia, vascular dementia, CVA, hypertension, anxiety, HTN, HLD who was brought into the ED after a fall at assisted living facility, she was too weak to get up from the toilet, felt dizzy prior to fall.  At baseline, walks independently.  Denies hitting head or LOC. Family feels her feet got tangled up as soon as the family left no syncope.   Assessment & Plan:   Principal Problem:   Pubic bone fracture (HCC) Active Problems:   CKD (chronic kidney disease) stage 3, GFR 30-59 ml/min (HCC)   Aphasia   History of CVA (cerebrovascular accident)   Vascular dementia (HCC)  Fall/generalized weakness/right pubic ramus fracture: Pain controlled.  PT OT assessment completed, SNF recommended as expected, pending placement, medically stable.  Per TOC, the earliest she can be discharged will be tomorrow.  Essential hypertension: Slightly elevated only at times, continue current dose of losartan.  Patient was not taking metoprolol  PTA.  History of CVA leading to aphasia: Continue Plavix .  Vascular dementia: Stable.  Avoid delirium.  CKD stage IIIb: Baseline creatinine around 1.2 and currently at baseline.  DVT prophylaxis: enoxaparin  (LOVENOX ) injection 30 mg Start: 01/10/24 1030 SCDs Start: 01/09/24 2144   Code Status: Full Code  Family Communication:  None present at bedside.  Plan of care discussed with patient in length and he/she verbalized understanding and agreed with it.  Status is: Inpatient Remains inpatient appropriate because: Medically stable, pending placement to SNF.  Estimated body mass index is 19.37 kg/m as calculated from the following:   Height as of this encounter: 5\' 10"  (1.778 m).   Weight as of this encounter: 61.2 kg.    Nutritional Assessment: Body mass index is  19.37 kg/m.Aaron Aas Seen by dietician.  I agree with the assessment and plan as outlined below: Nutrition Status: Nutrition Problem: Increased nutrient needs Etiology: hip fracture Signs/Symptoms: estimated needs Interventions: Refer to RD note for recommendations  . Skin Assessment: I have examined the patient's skin and I agree with the wound assessment as performed by the wound care RN as outlined below:    Consultants:  None  Procedures:  None  Antimicrobials:  Anti-infectives (From admission, onward)    None         Subjective: Seen and examined.  Other than groin pain, no other complaint but groin pain is improving as well.  She is very pleasant.  Objective: Vitals:   01/10/24 1635 01/10/24 2031 01/11/24 0606 01/11/24 0805  BP: 131/60 (!) 157/78 (!) 187/83 (!) 196/75  Pulse: 66 64 70 64  Resp: 17 18 17 17   Temp: 97.7 F (36.5 C) 97.7 F (36.5 C) 98.2 F (36.8 C) 98 F (36.7 C)  TempSrc: Oral Oral Oral Oral  SpO2: 98% 97% 97% 95%  Weight:      Height:        Intake/Output Summary (Last 24 hours) at 01/11/2024 1013 Last data filed at 01/11/2024 0500 Gross per 24 hour  Intake 220 ml  Output 1150 ml  Net -930 ml   Filed Weights   01/09/24 1802  Weight: 61.2 kg    Examination:  General exam: Appears calm and comfortable  Respiratory system: Clear to auscultation. Respiratory effort normal. Cardiovascular system: S1 & S2 heard, RRR. No JVD, murmurs, rubs, gallops or clicks. No pedal edema.  Gastrointestinal system: Abdomen is nondistended, soft and nontender. No organomegaly or masses felt. Normal bowel sounds heard. Central nervous system: Alert and oriented x 2. No focal neurological deficits. Extremities: Symmetric 5 x 5 power. Skin: No rashes, lesions or ulcers.    Data Reviewed: I have personally reviewed following labs and imaging studies  CBC: Recent Labs  Lab 01/09/24 1836 01/10/24 0644  WBC 11.0* 7.5  NEUTROABS 9.9*  --   HGB 14.5 12.1   HCT 44.4 36.2  MCV 88.4 87.7  PLT 156 136*   Basic Metabolic Panel: Recent Labs  Lab 01/09/24 1836 01/10/24 0644 01/11/24 0505  NA 140 138 140  K 4.0 3.7 3.9  CL 102 105 106  CO2 26 29 28   GLUCOSE 136* 103* 114*  BUN 19 18 20   CREATININE 1.22* 1.23* 1.14*  CALCIUM  9.5 8.3* 8.7*  MG  --  1.9  --   PHOS  --  2.9  --    GFR: Estimated Creatinine Clearance: 33.6 mL/min (A) (by C-G formula based on SCr of 1.14 mg/dL (H)). Liver Function Tests: Recent Labs  Lab 01/09/24 1836 01/10/24 0644  AST 29 19  ALT 14 9  ALKPHOS 40 31*  BILITOT 1.0 1.2  PROT 6.6 5.3*  ALBUMIN 3.6 2.9*   No results for input(s): "LIPASE", "AMYLASE" in the last 168 hours. No results for input(s): "AMMONIA" in the last 168 hours. Coagulation Profile: No results for input(s): "INR", "PROTIME" in the last 168 hours. Cardiac Enzymes: Recent Labs  Lab 01/09/24 1836  CKTOTAL 130   BNP (last 3 results) No results for input(s): "PROBNP" in the last 8760 hours. HbA1C: No results for input(s): "HGBA1C" in the last 72 hours. CBG: Recent Labs  Lab 01/09/24 1832  GLUCAP 147*   Lipid Profile: No results for input(s): "CHOL", "HDL", "LDLCALC", "TRIG", "CHOLHDL", "LDLDIRECT" in the last 72 hours. Thyroid  Function Tests: No results for input(s): "TSH", "T4TOTAL", "FREET4", "T3FREE", "THYROIDAB" in the last 72 hours. Anemia Panel: No results for input(s): "VITAMINB12", "FOLATE", "FERRITIN", "TIBC", "IRON", "RETICCTPCT" in the last 72 hours. Sepsis Labs: No results for input(s): "PROCALCITON", "LATICACIDVEN" in the last 168 hours.  No results found for this or any previous visit (from the past 240 hours).   Radiology Studies: CT Head Wo Contrast Result Date: 01/09/2024 CLINICAL DATA:  Fall, trauma EXAM: CT HEAD WITHOUT CONTRAST CT CERVICAL SPINE WITHOUT CONTRAST TECHNIQUE: Multidetector CT imaging of the head and cervical spine was performed following the standard protocol without intravenous  contrast. Multiplanar CT image reconstructions of the cervical spine were also generated. RADIATION DOSE REDUCTION: This exam was performed according to the departmental dose-optimization program which includes automated exposure control, adjustment of the mA and/or kV according to patient size and/or use of iterative reconstruction technique. COMPARISON:  04/08/2021 FINDINGS: CT HEAD FINDINGS Brain: No evidence of acute infarction, hemorrhage, hydrocephalus, extra-axial collection or mass lesion/mass effect. Periventricular and deep white matter hypodensity. Multiple lacunar infarctions of the basal ganglia, corona radiata, and thalami as well as the cerebellar hemisphere series. Vascular: No hyperdense vessel or unexpected calcification. Skull: Normal. Negative for fracture or focal lesion. Sinuses/Orbits: No acute finding. Other: None. CT CERVICAL SPINE FINDINGS Alignment: Degenerative straightening of the normal cervical lordosis. Skull base and vertebrae: No acute fracture. No primary bone lesion or focal pathologic process. Soft tissues and spinal canal: No prevertebral fluid or swelling. No visible canal hematoma. Disc levels: Mild-to-moderate multilevel cervical disc degenerative disease, worst from C5-C7 Upper chest: Negative. Other: None. IMPRESSION: 1.  No acute intracranial pathology. Small-vessel white matter disease and multiple lacunar infarctions. 2. No fracture or static subluxation of the cervical spine. 3. Mild-to-moderate multilevel cervical disc degenerative disease, worst from C5-C7. Electronically Signed   By: Fredricka Jenny M.D.   On: 01/09/2024 19:38   CT Cervical Spine Wo Contrast Result Date: 01/09/2024 CLINICAL DATA:  Fall, trauma EXAM: CT HEAD WITHOUT CONTRAST CT CERVICAL SPINE WITHOUT CONTRAST TECHNIQUE: Multidetector CT imaging of the head and cervical spine was performed following the standard protocol without intravenous contrast. Multiplanar CT image reconstructions of the cervical  spine were also generated. RADIATION DOSE REDUCTION: This exam was performed according to the departmental dose-optimization program which includes automated exposure control, adjustment of the mA and/or kV according to patient size and/or use of iterative reconstruction technique. COMPARISON:  04/08/2021 FINDINGS: CT HEAD FINDINGS Brain: No evidence of acute infarction, hemorrhage, hydrocephalus, extra-axial collection or mass lesion/mass effect. Periventricular and deep white matter hypodensity. Multiple lacunar infarctions of the basal ganglia, corona radiata, and thalami as well as the cerebellar hemisphere series. Vascular: No hyperdense vessel or unexpected calcification. Skull: Normal. Negative for fracture or focal lesion. Sinuses/Orbits: No acute finding. Other: None. CT CERVICAL SPINE FINDINGS Alignment: Degenerative straightening of the normal cervical lordosis. Skull base and vertebrae: No acute fracture. No primary bone lesion or focal pathologic process. Soft tissues and spinal canal: No prevertebral fluid or swelling. No visible canal hematoma. Disc levels: Mild-to-moderate multilevel cervical disc degenerative disease, worst from C5-C7 Upper chest: Negative. Other: None. IMPRESSION: 1. No acute intracranial pathology. Small-vessel white matter disease and multiple lacunar infarctions. 2. No fracture or static subluxation of the cervical spine. 3. Mild-to-moderate multilevel cervical disc degenerative disease, worst from C5-C7. Electronically Signed   By: Fredricka Jenny M.D.   On: 01/09/2024 19:38   DG Hip Unilat With Pelvis 2-3 Views Right Result Date: 01/09/2024 CLINICAL DATA:  Pain after fall EXAM: DG HIP (WITH OR WITHOUT PELVIS) 3V RIGHT COMPARISON:  None Available. FINDINGS: There is a fracture of the right inferior pubic ramus. No additional fracture or dislocation. Preserved joint spaces. Osteopenia. Vascular calcifications in the pelvis. Degenerative changes of the visualized portions of the  lumbar spine. Overlapping cardiac leads. If there is further concern of hip fracture additional cross-sectional imaging study could be considered as clinically appropriate further delineation in the setting of osteopenia. IMPRESSION: Fracture of the right inferior pubic ramus.  Osteopenia. Electronically Signed   By: Adrianna Horde M.D.   On: 01/09/2024 19:18   DG Chest Port 1 View Result Date: 01/09/2024 CLINICAL DATA:  Pain after fall EXAM: PORTABLE CHEST 1 VIEW COMPARISON:  X-ray 08/30/2022. FINDINGS: Hyperinflation. No consolidation, pneumothorax or effusion. Normal cardiopericardial silhouette with a calcified aorta. Apical pleural thickening. Overlapping cardiac leads. The left inferior costophrenic angle is clipped off the edge of the film. Degenerative changes along the spine. If there is further concern of the sequela of trauma additional CT workup as clinically appropriate for further delineation. IMPRESSION: Hyperinflation.  Chronic changes.  No acute cardiopulmonary disease. Electronically Signed   By: Adrianna Horde M.D.   On: 01/09/2024 19:17    Scheduled Meds:  cholecalciferol  1,000 Units Oral Daily   clopidogrel   75 mg Oral Daily   enoxaparin  (LOVENOX ) injection  30 mg Subcutaneous Q24H   escitalopram  5 mg Oral Daily   feeding supplement  237 mL Oral BID BM   losartan  50 mg Oral Daily   metoprolol  succinate  25 mg Oral Daily  multivitamin with minerals  1 tablet Oral Daily   Continuous Infusions:   LOS: 2 days   Modena Andes, MD [facility]  01/11/2024, 10:13 AM   *Please note that this is a verbal dictation therefore any spelling or grammatical errors are due to the "[employer]agon [employer] One" system interpretation.  Please page via Amion and do not message via secure chat for urgent patient care matters. Secure chat can be used for non urgent patient care matters.  How to contact the [facility] Attending or Consulting provider 7A - 7P or covering provider during after hours 7P  -7A, for this patient?  Check the care team in [facility] and look for a) attending/consulting [facility] provider listed and b) the [facility] team listed. Page or secure chat 7A-7P. Log into [email or web address] and use [place]'s universal password to access. If you do not have the password, please contact the hospital operator. Locate the [facility] provider you are looking for under [facility] and page to a number that you can be directly reached. If you still have difficulty reaching the provider, please page the [facility] (Director on Call) for the Hospitalists listed on amion for assistance.

## 2024-01-11 NOTE — [place] FL2 (Signed)
 [place]  MEDICAID FL2 LEVEL OF CARE FORM     IDENTIFICATION  Patient Name: Crystal Day Birthdate: [birth year] Sex: female Admission Date (Current Location): 01/09/2024  [facility] and [place] Number:  Producer, television/film/video and Address:  [facility], [address] [place], [place] [address]      Provider Number: [id number]  Attending Physician Name and Address:  Modena Andes, MD  Relative Name and Phone Number:  Elberta Grebe (Daughter)  [phone] [employer])    Current Level of Care: Hospital Recommended Level of Care: Skilled Nursing Facility Prior Approval Number:    Date Approved/Denied:   PASRR Number: Pending  Discharge Plan: SNF    Current Diagnoses: Patient Active Problem List   Diagnosis Date Noted   Pubic bone fracture (HCC) 01/09/2024   History of CVA (cerebrovascular accident) 01/09/2024   Vascular dementia (HCC) 01/09/2024   Cerebrovascular accident (CVA) due to thrombosis of right middle cerebral artery (HCC) 05/20/2021   Transient neurologic deficit 05/20/2021   Aphasia 11/04/2020   Memory loss 11/04/2020   Vitamin D  deficiency 09/30/2020   CVA (cerebral vascular accident) (HCC) 04/26/2019   Non-STEMI (non-ST elevated myocardial infarction) (HCC)    Non-obstructive CAD    Hypertension    TIA (transient ischemic attack)    Lacunar infarct, acute (HCC) 10/03/2015   CKD (chronic kidney disease) stage 3, GFR 30-59 ml/min (HCC) 10/03/2015   Acute stroke due to ischemia (HCC)    HLD (hyperlipidemia)    OA (osteoarthritis) of knee 12/31/2014   Syncope 09/28/2014   Hyperlipidemia 04/17/2014   Atherosclerosis of native coronary artery of native heart without angina pectoris 10/05/2013   NSTEMI (non-ST elevated myocardial infarction) (HCC) 09/29/2013   Chest pain 09/28/2013   Essential hypertension 09/28/2013   Acute renal failure (HCC) 09/28/2013    Orientation RESPIRATION BLADDER Height & Weight     Self, Situation,  Place  Normal External catheter Weight: 135 lb (61.2 kg) Height:  5\' 10"  (177.8 cm)  BEHAVIORAL SYMPTOMS/MOOD NEUROLOGICAL BOWEL NUTRITION STATUS      Continent Diet (see d/c summary)  AMBULATORY STATUS COMMUNICATION OF NEEDS Skin   Extensive Assist Verbally Normal                       Personal Care Assistance Level of Assistance  Bathing, Feeding, Dressing Bathing Assistance: Maximum assistance Feeding assistance: Independent Dressing Assistance: Limited assistance     Functional Limitations Info  Sight, Hearing, Speech Sight Info: Adequate Hearing Info: Adequate Speech Info: Adequate    SPECIAL CARE FACTORS FREQUENCY  OT (By licensed OT), PT (By licensed PT)     PT Frequency: 5x/week OT Frequency: 5x/week            Contractures Contractures Info: Not present    Additional Factors Info  Code Status, Allergies Code Status Info: Full code Allergies Info: Tetracyclines & Related, penicillins, Elavil (amitriptyline), Erythromycin, Macrodantin (nitrofurantoin Macrocrystal)           Current Medications (01/11/2024):  This is the current hospital active medication list Current Facility-Administered Medications  Medication Dose Route Frequency Provider Last Rate Last Admin   acetaminophen  (TYLENOL ) tablet 650 mg  650 mg Oral Q6H PRN Doutova, Anastassia, MD       Or   acetaminophen  (TYLENOL ) suppository 650 mg  650 mg Rectal Q6H PRN Doutova, Anastassia, MD       albuterol  (PROVENTIL ) (2.5 MG/3ML) 0.083% nebulizer solution 3 mL  3 mL Inhalation Q4H PRN Pahwani, Ravi,  MD       cholecalciferol (VITAMIN D3) 25 MCG (1000 UNIT) tablet 1,000 Units  1,000 Units Oral Daily Modena Andes, MD   1,000 Units at 01/11/24 1033   clopidogrel  (PLAVIX ) tablet 75 mg  75 mg Oral Daily Doutova, Anastassia, MD   75 mg at 01/11/24 1033   [START ON 01/12/2024] enoxaparin  (LOVENOX ) injection 40 mg  40 mg Subcutaneous Q24H Reome, Earle J, RPH       escitalopram (LEXAPRO) tablet 5 mg  5 mg  Oral Daily Doutova, Anastassia, MD   5 mg at 01/11/24 1032   feeding supplement (ENSURE ENLIVE / ENSURE PLUS) liquid 237 mL  237 mL Oral BID BM Pahwani, Ravi, MD   237 mL at 01/11/24 1034   fentaNYL  (SUBLIMAZE ) injection 12.5-50 mcg  12.5-50 mcg Intravenous Q2H PRN Doutova, Anastassia, MD       HYDROcodone-acetaminophen  (NORCO/VICODIN) 5-325 MG per tablet 1-2 tablet  1-2 tablet Oral Q4H PRN Doutova, Anastassia, MD   2 tablet at 01/11/24 1032   losartan (COZAAR) tablet 50 mg  50 mg Oral Daily Pahwani, Ravi, MD   50 mg at 01/11/24 1034   metoprolol  succinate (TOPROL -XL) 24 hr tablet 25 mg  25 mg Oral Daily Doutova, Anastassia, MD   25 mg at 01/11/24 1032   multivitamin with minerals tablet 1 tablet  1 tablet Oral Daily Modena Andes, MD   1 tablet at 01/11/24 1032   ondansetron  (ZOFRAN ) tablet 4 mg  4 mg Oral Q6H PRN Doutova, Anastassia, MD       Or   ondansetron  (ZOFRAN ) injection 4 mg  4 mg Intravenous Q6H PRN Doutova, Anastassia, MD         Discharge Medications: Please see discharge summary for a list of discharge medications.  Relevant Imaging Results:  Relevant Lab Results:   Additional Information SSN [id number] [place], [place]

## 2024-01-12 DIAGNOSIS — S32509A Unspecified fracture of unspecified pubis, initial encounter for closed fracture: Secondary | ICD-10-CM | POA: Diagnosis not present

## 2024-01-12 MED ORDER — LOSARTAN POTASSIUM 50 MG PO TABS
100.0000 mg | ORAL_TABLET | Freq: Every day | ORAL | Status: DC
  Administered 2024-01-13 – 2024-01-14 (×2): 100 mg via ORAL
  Filled 2024-01-12 (×2): qty 2

## 2024-01-12 MED ORDER — HYDROCODONE-ACETAMINOPHEN 5-325 MG PO TABS: 1.0000 | ORAL_TABLET | ORAL | 0 refills | Status: AC | PRN

## 2024-01-12 NOTE — TOC Progression Note (Addendum)
 Transition of Care [facility]) - Progression Note    Patient Details  Name: Crystal Day MRN: [id number] Date of Birth: 1935-07-10  [facility]) CM/SW Contact  Katrinka Parr, LCSW Phone Number: 01/12/2024, 10:42 AM  Clinical Narrative:     CSW called pt's daughter and provided SNF offers. [facility] could not offer a bed. She will review list; she also plans to call contacts at [facility]She is also interested in [facility]. CSW explained SNF choice is needed today. Explained medicare coverage and insurance auth process. Daughter agrees to contact CSW with choice today.  PASRR is still pending    1640: Daughter is waiting to hear back from [facility]. She is also CSW is awaiting response form [facility]  SNF and PTAR auth request initiated. Facility will need to be added to authorization once choice is made.  PASRR still pending.   Expected Discharge Plan: Skilled Nursing Facility Barriers to Discharge: Insurance Authorization, Other (must enter comment) (PASRR pending)  Expected Discharge Plan and Services                                               Social Determinants of Health (SDOH) Interventions SDOH Screenings   Food Insecurity: No Food Insecurity (01/09/2024)  Housing: Low Risk  (01/09/2024)  Transportation Needs: No Transportation Needs (01/09/2024)  Utilities: At Risk (01/09/2024)  Social Connections: Moderately Integrated (01/09/2024)  Tobacco Use: Medium Risk (01/09/2024)    Readmission Risk Interventions     No data to display

## 2024-01-12 NOTE — Progress Notes (Signed)
 Mobility Specialist Progress Note:    01/12/24 0945  Mobility  Activity  (bed level exercises)  Level of Assistance Minimal assist, patient does 75% or more  Assistive Device Other (Comment) (HHA)  RLE Weight Bearing Per Provider Order WBAT  LLE Weight Bearing Per Provider Order WBAT  Activity Response Tolerated well  Mobility Referral Yes  Mobility visit 1 Mobility  Mobility Specialist Start Time (ACUTE ONLY) 0932  Mobility Specialist Stop Time (ACUTE ONLY) 0945  Mobility Specialist Time Calculation (min) (ACUTE ONLY) 13 min   Pt received in bed declining OOB activity d/t strong pain in RLE. Agreeable to bed level exercises. Pt was able to move RLE side to side but needed MinA to bend and lift leg. LLE needed no extra support. Call bell and personal belongings in reach. All needs met.   Inetta Manes Mobility Specialist  Please contact vis Secure Chat or  Rehab Office [phone]

## 2024-01-12 NOTE — Plan of Care (Signed)

## 2024-01-12 NOTE — Plan of Care (Signed)
  Problem: Clinical Measurements: Goal: Ability to maintain clinical measurements within normal limits will improve Outcome: Progressing   Problem: Activity: Goal: Risk for activity intolerance will decrease Outcome: Progressing   Problem: Coping: Goal: Level of anxiety will decrease Outcome: Progressing   Problem: Pain Managment: Goal: General experience of comfort will improve and/or be controlled Outcome: Progressing

## 2024-01-12 NOTE — Progress Notes (Signed)
 Physical Therapy Treatment Patient Details Name: Crystal Day MRN: [id number] DOB: [birth year] Today's Date: 01/12/2024   History of Present Illness 89 y.o. female presents to [facility] hospital on 01/09/2024 after a fall. Imaging with findings of R inferior pubic ramus fx. PMH includes aphasia, vascular dementia, CVA, hypertension, anxiety, HTN, HLD.    PT Comments  Pt received in supine and agreeable to session. Pt requires increased cues for sequencing and technique throughout session due to impaired problem solving. Pt able to stand and pivot to the recliner with min-mod A due to limited RLE WB tolerance and low LLE clearance. Once in front of the recliner, pt able to perform a few marches and 1 step forward/backward with focus on LLE foot clearance. Pt able to improve clearance with cues and assist with weight shifting. Pt continues to benefit from PT services to progress toward functional mobility goals.     If plan is discharge home, recommend the following: A lot of help with walking and/or transfers;A lot of help with bathing/dressing/bathroom;Assistance with cooking/housework;Direct supervision/assist for medications management;Direct supervision/assist for financial management;Assist for transportation;Help with stairs or ramp for entrance;Supervision due to cognitive status   Can travel by private vehicle     No  Equipment Recommendations  Wheelchair (measurements PT)    Recommendations for Other Services       Precautions / Restrictions Precautions Precautions: Fall Recall of Precautions/Restrictions: Impaired Restrictions Weight Bearing Restrictions Per Provider Order: Yes RLE Weight Bearing Per Provider Order: Weight bearing as tolerated LLE Weight Bearing Per Provider Order: Weight bearing as tolerated Other Position/Activity Restrictions: no documented WB restrictions. OT received verbal order from Dr. Lilyan Remedies this AM for WBAT     Mobility  Bed Mobility Overal bed  mobility: Needs Assistance Bed Mobility: Supine to Sit     Supine to sit: Mod assist, HOB elevated, Used rails     General bed mobility comments: Mod A for RLE management and trunk elevation    Transfers Overall transfer level: Needs assistance Equipment used: Rolling walker (2 wheels) Transfers: Sit to/from Stand, Bed to chair/wheelchair/BSC Sit to Stand: Mod assist   Step pivot transfers: Min assist       General transfer comment: cues for hand placement and mod A for power up and anterior weight shift from low EOB. step pivot to recliner with pt pivoting LLE due to no foot clearance, but is able to clear RLE. pt able to take 1 step forward and backward with LLE clearance with cues and min A for weight shift    Ambulation/Gait               General Gait Details: unable due to pain and fatigue   Stairs             Wheelchair Mobility     Tilt Bed    Modified Rankin (Stroke Patients Only)       Balance Overall balance assessment: Needs assistance Sitting-balance support: No upper extremity supported, Feet supported Sitting balance-Leahy Scale: Fair     Standing balance support: Bilateral upper extremity supported, Reliant on assistive device for balance, During functional activity Standing balance-Leahy Scale: Poor Standing balance comment: with RW support and min A                            Communication Communication Communication: No apparent difficulties  Cognition Arousal: Alert Behavior During Therapy: WFL for tasks assessed/performed   PT - Cognitive impairments: History  of cognitive impairments                         Following commands: Intact      Cueing Cueing Techniques: Verbal cues, Tactile cues, Visual cues  Exercises      General Comments        Pertinent Vitals/Pain Pain Assessment Pain Assessment: Faces Faces Pain Scale: Hurts even more Pain Location: pelvis, RLE Pain Descriptors /  Indicators: Grimacing Pain Intervention(s): Limited activity within patient's tolerance, Monitored during session, Repositioned     PT Goals (current goals can now be found in the care plan section) Acute Rehab PT Goals Patient Stated Goal: to return to prior level of function PT Goal Formulation: With patient/family Time For Goal Achievement: 01/24/24 Progress towards PT goals: Progressing toward goals    Frequency    Min 2X/week       AM-PAC PT "6 Clicks" Mobility   Outcome Measure  Help needed turning from your back to your side while in a flat bed without using bedrails?: A Lot Help needed moving from lying on your back to sitting on the side of a flat bed without using bedrails?: A Lot Help needed moving to and from a bed to a chair (including a wheelchair)?: A Little Help needed standing up from a chair using your arms (e.g., wheelchair or bedside chair)?: A Little Help needed to walk in hospital room?: A Lot Help needed climbing 3-5 steps with a railing? : Total 6 Click Score: 13    End of Session Equipment Utilized During Treatment: Gait belt Activity Tolerance: Patient tolerated treatment well Patient left: with call bell/phone within reach;in chair;with chair alarm set Nurse Communication: Mobility status PT Visit Diagnosis: Other abnormalities of gait and mobility (R26.89);Muscle weakness (generalized) (M62.81);Pain     Time: 1610-9604 PT Time Calculation (min) (ACUTE ONLY): 17 min  Charges:    $Therapeutic Activity: 8-22 mins PT General Charges $$ ACUTE PT VISIT: 1 Visit                     Michaelle Adolphus, PTA Acute Rehabilitation Services Secure Chat Preferred  [phone]    Michaelle Adolphus 01/12/2024, 12:16 PM

## 2024-01-12 NOTE — Progress Notes (Signed)
 PROGRESS NOTE    JAXSON KEENER  MWU:[id number] DOB: 06-28-1935 DOA: 01/09/2024 PCP: Faustina Hood, MD   Brief Narrative:  Crystal Day is a 89 y.o. female with medical history significant of aphasia, vascular dementia, CVA, hypertension, anxiety, HTN, HLD who was brought into the ED after a fall at assisted living facility, she was too weak to get up from the toilet, felt dizzy prior to fall.  At baseline, walks independently.  Denies hitting head or LOC. Family feels her feet got tangled up as soon as the family left no syncope.   Assessment & Plan:   Principal Problem:   Pubic bone fracture (HCC) Active Problems:   CKD (chronic kidney disease) stage 3, GFR 30-59 ml/min (HCC)   Aphasia   History of CVA (cerebrovascular accident)   Vascular dementia (HCC)  Fall/generalized weakness/right pubic ramus fracture: Pain controlled.  PT OT assessment completed, SNF recommended as expected, pending placement, medically stable.  Per TOC, she cannot discharge today. Workup in progress.  Essential hypertension: Slightly elevated only at times,Patient was not taking metoprolol  PTA however this has been resumed here.  Also on losartan PTA 50 mg dose, will increase this to 100 mg daily.  History of CVA leading to aphasia: Continue Plavix .  Vascular dementia: Stable.  Avoid delirium.  CKD stage IIIb: Baseline creatinine around 1.2 and currently at baseline.  DVT prophylaxis: enoxaparin  (LOVENOX ) injection 40 mg Start: 01/12/24 1030 SCDs Start: 01/09/24 2144   Code Status: Full Code  Family Communication:  None present at bedside.  Plan of care discussed with patient in length and he/she verbalized understanding and agreed with it.  Status is: Inpatient Remains inpatient appropriate because: Medically stable since 01/11/2024, pending placement to SNF.  Estimated body mass index is 19.37 kg/m as calculated from the following:   Height as of this encounter: 5\' 10"  (1.778 m).   Weight as of  this encounter: 61.2 kg.    Nutritional Assessment: Body mass index is 19.37 kg/m.Crystal Day Seen by dietician.  I agree with the assessment and plan as outlined below: Nutrition Status: Nutrition Problem: Increased nutrient needs Etiology: hip fracture Signs/Symptoms: estimated needs Interventions: Refer to RD note for recommendations  . Skin Assessment: I have examined the patient's skin and I agree with the wound assessment as performed by the wound care RN as outlined below:    Consultants:  None  Procedures:  None  Antimicrobials:  Anti-infectives (From admission, onward)    None         Subjective: Patient seen and examined.  Complains of groin pain but no worse or better than yesterday.  No other complaint.  Objective: Vitals:   01/11/24 1651 01/11/24 1953 01/12/24 0453 01/12/24 0830  BP: (!) 149/67 (!) 141/63 (!) 163/64 (!) 181/71  Pulse: 66 68 61 64  Resp: 17 18 16 17   Temp: 98.7 F (37.1 C) 98 F (36.7 C) 97.7 F (36.5 C) 98.2 F (36.8 C)  TempSrc: Oral Oral Oral Oral  SpO2: 96% 96% 95% 97%  Weight:      Height:        Intake/Output Summary (Last 24 hours) at 01/12/2024 1015 Last data filed at 01/12/2024 0600 Gross per 24 hour  Intake 360 ml  Output 875 ml  Net -515 ml   Filed Weights   01/09/24 1802  Weight: 61.2 kg    Examination:  General exam: Appears calm and comfortable  Respiratory system: Clear to auscultation. Respiratory effort normal. Cardiovascular system: S1 & S2  heard, RRR. No JVD, murmurs, rubs, gallops or clicks. No pedal edema. Gastrointestinal system: Abdomen is nondistended, soft and nontender. No organomegaly or masses felt. Normal bowel sounds heard. Central nervous system: Alert and oriented x 2. No focal neurological deficits. Extremities: Symmetric 5 x 5 power. Skin: No rashes, lesions or ulcers.   Data Reviewed: I have personally reviewed following labs and imaging studies  CBC: Recent Labs  Lab 01/09/24 1836  01/10/24 0644  WBC 11.0* 7.5  NEUTROABS 9.9*  --   HGB 14.5 12.1  HCT 44.4 36.2  MCV 88.4 87.7  PLT 156 136*   Basic Metabolic Panel: Recent Labs  Lab 01/09/24 1836 01/10/24 0644 01/11/24 0505  NA 140 138 140  K 4.0 3.7 3.9  CL 102 105 106  CO2 26 29 28   GLUCOSE 136* 103* 114*  BUN 19 18 20   CREATININE 1.22* 1.23* 1.14*  CALCIUM  9.5 8.3* 8.7*  MG  --  1.9  --   PHOS  --  2.9  --    GFR: Estimated Creatinine Clearance: 33.6 mL/min (A) (by C-G formula based on SCr of 1.14 mg/dL (H)). Liver Function Tests: Recent Labs  Lab 01/09/24 1836 01/10/24 0644  AST 29 19  ALT 14 9  ALKPHOS 40 31*  BILITOT 1.0 1.2  PROT 6.6 5.3*  ALBUMIN 3.6 2.9*   No results for input(s): "LIPASE", "AMYLASE" in the last 168 hours. No results for input(s): "AMMONIA" in the last 168 hours. Coagulation Profile: No results for input(s): "INR", "PROTIME" in the last 168 hours. Cardiac Enzymes: Recent Labs  Lab 01/09/24 1836  CKTOTAL 130   BNP (last 3 results) No results for input(s): "PROBNP" in the last 8760 hours. HbA1C: No results for input(s): "HGBA1C" in the last 72 hours. CBG: Recent Labs  Lab 01/09/24 1832  GLUCAP 147*   Lipid Profile: No results for input(s): "CHOL", "HDL", "LDLCALC", "TRIG", "CHOLHDL", "LDLDIRECT" in the last 72 hours. Thyroid  Function Tests: No results for input(s): "TSH", "T4TOTAL", "FREET4", "T3FREE", "THYROIDAB" in the last 72 hours. Anemia Panel: No results for input(s): "VITAMINB12", "FOLATE", "FERRITIN", "TIBC", "IRON", "RETICCTPCT" in the last 72 hours. Sepsis Labs: No results for input(s): "PROCALCITON", "LATICACIDVEN" in the last 168 hours.  No results found for this or any previous visit (from the past 240 hours).   Radiology Studies: No results found.   Scheduled Meds:  cholecalciferol  1,000 Units Oral Daily   clopidogrel   75 mg Oral Daily   enoxaparin  (LOVENOX ) injection  40 mg Subcutaneous Q24H   escitalopram  5 mg Oral Daily    feeding supplement  237 mL Oral BID BM   losartan  50 mg Oral Daily   metoprolol  succinate  25 mg Oral Daily   multivitamin with minerals  1 tablet Oral Daily   Continuous Infusions:   LOS: 3 days   Modena Andes, MD [facility]  01/12/2024, 10:15 AM   *Please note that this is a verbal dictation therefore any spelling or grammatical errors are due to the "Dragon Medical One" system interpretation.  Please page via Amion and do not message via secure chat for urgent patient care matters. Secure chat can be used for non urgent patient care matters.  How to contact the [facility] Attending or Consulting provider 7A - 7P or covering provider during after hours 7P -7A, for this patient?  Check the care team in [facility] and look for a) attending/consulting [facility] provider listed and b) the [facility] team listed. Page or secure chat 7A-7P.  Log into [email or web address] and use [facility]'s universal password to access. If you do not have the password, please contact the hospital operator. Locate the [facility] provider you are looking for under [facility] and page to a number that you can be directly reached. If you still have difficulty reaching the provider, please page the [facility] (Director on Call) for the Hospitalists listed on amion for assistance.

## 2024-01-13 DIAGNOSIS — S32509A Unspecified fracture of unspecified pubis, initial encounter for closed fracture: Secondary | ICD-10-CM | POA: Diagnosis not present

## 2024-01-13 NOTE — Plan of Care (Signed)

## 2024-01-13 NOTE — Plan of Care (Signed)

## 2024-01-13 NOTE — Progress Notes (Signed)
 PROGRESS NOTE    Crystal Day  ZOX:[id number] DOB: 02-06-1935 DOA: 01/09/2024 PCP: Crystal Hood, MD   Brief Narrative:  Crystal Day is a 89 y.o. female with medical history significant of aphasia, vascular dementia, CVA, hypertension, anxiety, HTN, HLD who was brought into the ED after a fall at assisted living facility, she was too weak to get up from the toilet, felt dizzy prior to fall.  At baseline, walks independently.  Denies hitting head or LOC. Family feels her feet got tangled up as soon as the family left no syncope.   Assessment & Plan:   Principal Problem:   Pubic bone fracture (HCC) Active Problems:   CKD (chronic kidney disease) stage 3, GFR 30-59 ml/min (HCC)   Aphasia   History of CVA (cerebrovascular accident)   Vascular dementia (HCC)  Fall/generalized weakness/right pubic ramus fracture: Pain controlled.  PT OT assessment completed, SNF recommended as expected, pending placement, medically stable.  Per TOC, she cannot discharge today since PASSR and insurance authorization is still pending.  Essential hypertension: Was slightly elevated only at times,Patient was not taking metoprolol  PTA however this has been resumed here.  Increased her losartan from 50 to 100 mg yesterday.  Blood pressure now better.  History of CVA leading to aphasia: Continue Plavix .  Vascular dementia: Stable.  Avoid delirium.  CKD stage IIIb: Baseline creatinine around 1.2 and currently at baseline.  DVT prophylaxis: enoxaparin  (LOVENOX ) injection 40 mg Start: 01/12/24 1030 SCDs Start: 01/09/24 2144   Code Status: Full Code  Family Communication:  None present at bedside.  Plan of care discussed with patient in length and he/she verbalized understanding and agreed with it.  Status is: Inpatient Remains inpatient appropriate because: Medically stable since 01/11/2024, pending placement to SNF.  Estimated body mass index is 19.37 kg/m as calculated from the following:   Height as  of this encounter: 5\' 10"  (1.778 m).   Weight as of this encounter: 61.2 kg.    Nutritional Assessment: Body mass index is 19.37 kg/m.Crystal Day Seen by dietician.  I agree with the assessment and plan as outlined below: Nutrition Status: Nutrition Problem: Increased nutrient needs Etiology: hip fracture Signs/Symptoms: estimated needs Interventions: Refer to RD note for recommendations  . Skin Assessment: I have examined the patient's skin and I agree with the wound assessment as performed by the wound care RN as outlined below:    Consultants:  None  Procedures:  None  Antimicrobials:  Anti-infectives (From admission, onward)    None         Subjective: Patient seen and examined.  Pain is controlled.  No other complaint.  Objective: Vitals:   01/12/24 0830 01/12/24 1952 01/13/24 0407 01/13/24 0907  BP: (!) 181/71 (!) 168/66 (!) 159/73 (!) 150/51  Pulse: 64 70 60 69  Resp: 17 19 16 17   Temp: 98.2 F (36.8 C) 98.4 F (36.9 C) (!) 97.5 F (36.4 C)   TempSrc: Oral Oral Oral   SpO2: 97% 98% 99% 96%  Weight:      Height:        Intake/Output Summary (Last 24 hours) at 01/13/2024 1103 Last data filed at 01/13/2024 0900 Gross per 24 hour  Intake 360 ml  Output 700 ml  Net -340 ml   Filed Weights   01/09/24 1802  Weight: 61.2 kg    Examination:  General exam: Appears calm and comfortable  Respiratory system: Clear to auscultation. Respiratory effort normal. Cardiovascular system: S1 & S2 heard, RRR. No JVD,  murmurs, rubs, gallops or clicks. No pedal edema. Gastrointestinal system: Abdomen is nondistended, soft and nontender. No organomegaly or masses felt. Normal bowel sounds heard. Central nervous system: Alert and oriented x 2. no focal neurological deficits. Extremities: Symmetric 5 x 5 power. Skin: No rashes, lesions or ulcers.   Data Reviewed: I have personally reviewed following labs and imaging studies  CBC: Recent Labs  Lab 01/09/24 1836  01/10/24 0644  WBC 11.0* 7.5  NEUTROABS 9.9*  --   HGB 14.5 12.1  HCT 44.4 36.2  MCV 88.4 87.7  PLT 156 136*   Basic Metabolic Panel: Recent Labs  Lab 01/09/24 1836 01/10/24 0644 01/11/24 0505  NA 140 138 140  K 4.0 3.7 3.9  CL 102 105 106  CO2 26 29 28   GLUCOSE 136* 103* 114*  BUN 19 18 20   CREATININE 1.22* 1.23* 1.14*  CALCIUM  9.5 8.3* 8.7*  MG  --  1.9  --   PHOS  --  2.9  --    GFR: Estimated Creatinine Clearance: 33.6 mL/min (A) (by C-G formula based on SCr of 1.14 mg/dL (H)). Liver Function Tests: Recent Labs  Lab 01/09/24 1836 01/10/24 0644  AST 29 19  ALT 14 9  ALKPHOS 40 31*  BILITOT 1.0 1.2  PROT 6.6 5.3*  ALBUMIN 3.6 2.9*   No results for input(s): "LIPASE", "AMYLASE" in the last 168 hours. No results for input(s): "AMMONIA" in the last 168 hours. Coagulation Profile: No results for input(s): "INR", "PROTIME" in the last 168 hours. Cardiac Enzymes: Recent Labs  Lab 01/09/24 1836  CKTOTAL 130   BNP (last 3 results) No results for input(s): "PROBNP" in the last 8760 hours. HbA1C: No results for input(s): "HGBA1C" in the last 72 hours. CBG: Recent Labs  Lab 01/09/24 1832  GLUCAP 147*   Lipid Profile: No results for input(s): "CHOL", "HDL", "LDLCALC", "TRIG", "CHOLHDL", "LDLDIRECT" in the last 72 hours. Thyroid  Function Tests: No results for input(s): "TSH", "T4TOTAL", "FREET4", "T3FREE", "THYROIDAB" in the last 72 hours. Anemia Panel: No results for input(s): "VITAMINB12", "FOLATE", "FERRITIN", "TIBC", "IRON", "RETICCTPCT" in the last 72 hours. Sepsis Labs: No results for input(s): "PROCALCITON", "LATICACIDVEN" in the last 168 hours.  No results found for this or any previous visit (from the past 240 hours).   Radiology Studies: No results found.   Scheduled Meds:  cholecalciferol  1,000 Units Oral Daily   clopidogrel   75 mg Oral Daily   enoxaparin  (LOVENOX ) injection  40 mg Subcutaneous Q24H   escitalopram  5 mg Oral Daily    feeding supplement  237 mL Oral BID BM   losartan  100 mg Oral Daily   metoprolol  succinate  25 mg Oral Daily   multivitamin with minerals  1 tablet Oral Daily   Continuous Infusions:   LOS: 4 days   Crystal Andes, MD [facility]  01/13/2024, 11:03 AM   *Please note that this is a verbal dictation therefore any spelling or grammatical errors are due to the "Dragon Medical One" system interpretation.  Please page via Amion and do not message via secure chat for urgent patient care matters. Secure chat can be used for non urgent patient care matters.  How to contact the [facility] Attending or Consulting provider 7A - 7P or covering provider during after hours 7P -7A, for this patient?  Check the care team in [facility] and look for a) attending/consulting [facility] provider listed and b) the [facility] team listed. Page or secure chat 7A-7P. Log into [email or web address] and  use [facility] universal password to access. If you do not have the password, please contact the hospital operator. Locate the [facility] provider you are looking for under [facility] and page to a number that you can be directly reached. If you still have difficulty reaching the provider, please page the [facility] (Director on Call) for the Hospitalists listed on amion for assistance.

## 2024-01-13 NOTE — TOC Progression Note (Signed)
 Transition of Care [facility]) - Progression Note    Patient Details  Name: Crystal Day MRN: [id number] Date of Birth: [birth year]  Transition of Care [facility]) CM/SW Contact  Valley Gavia, LCSWA Phone Number: 01/13/2024, 2:26 PM  Clinical Narrative:     Bed offer accepted for [facility]. CSW spoke with HTA, auth approved for seven days effective today approval number [id number], auth approved for [facility] transport [id number]. CSW spoke with AD at [facility], bed available tomorrow. PASSR still pending. CSW spoke with pt's daughter Louanna Rouse, she is agreeable, will continue to follow.   Expected Discharge Plan: Skilled Nursing Facility Barriers to Discharge: Insurance Authorization, Other (must enter comment) (PASRR pending)  Expected Discharge Plan and Services                                               Social Determinants of Health (SDOH) Interventions SDOH Screenings   Food Insecurity: No Food Insecurity (01/09/2024)  Housing: Low Risk  (01/09/2024)  Transportation Needs: No Transportation Needs (01/09/2024)  Utilities: At Risk (01/09/2024)  Social Connections: Moderately Integrated (01/09/2024)  Tobacco Use: Medium Risk (01/09/2024)    Readmission Risk Interventions     No data to display

## 2024-01-13 NOTE — [place] FL2 (Signed)
 [place]  MEDICAID FL2 LEVEL OF CARE FORM     IDENTIFICATION  Patient Name: Crystal Day Birthdate: 22-Aug-1934 Sex: female Admission Date (Current Location): 01/09/2024  [facility] and [place] Number:  Producer, television/film/video and Address:  [facility] [place], [address] [place], [place] [address]      Provider Number: [id number]  Attending Physician Name and Address:  Modena Andes, MD  Relative Name and Phone Number:  Elberta Grebe (Daughter)  [phone] [facility])    Current Level of Care: Hospital Recommended Level of Care: Skilled Nursing Facility Prior Approval Number:    Date Approved/Denied:   PASRR Number: [id number]  Discharge Plan: SNF    Current Diagnoses: Patient Active Problem List   Diagnosis Date Noted   Pubic bone fracture (HCC) 01/09/2024   History of CVA (cerebrovascular accident) 01/09/2024   Vascular dementia (HCC) 01/09/2024   Cerebrovascular accident (CVA) due to thrombosis of right middle cerebral artery (HCC) 05/20/2021   Transient neurologic deficit 05/20/2021   Aphasia 11/04/2020   Memory loss 11/04/2020   Vitamin D  deficiency 09/30/2020   CVA (cerebral vascular accident) (HCC) 04/26/2019   Non-STEMI (non-ST elevated myocardial infarction) (HCC)    Non-obstructive CAD    Hypertension    TIA (transient ischemic attack)    Lacunar infarct, acute (HCC) 10/03/2015   CKD (chronic kidney disease) stage 3, GFR 30-59 ml/min (HCC) 10/03/2015   Acute stroke due to ischemia (HCC)    HLD (hyperlipidemia)    OA (osteoarthritis) of knee 12/31/2014   Syncope 09/28/2014   Hyperlipidemia 04/17/2014   Atherosclerosis of native coronary artery of native heart without angina pectoris 10/05/2013   NSTEMI (non-ST elevated myocardial infarction) (HCC) 09/29/2013   Chest pain 09/28/2013   Essential hypertension 09/28/2013   Acute renal failure (HCC) 09/28/2013    Orientation RESPIRATION BLADDER Height & Weight     Self, Situation,  Place  Normal External catheter Weight: 135 lb (61.2 kg) Height:  5\' 10"  (177.8 cm)  BEHAVIORAL SYMPTOMS/MOOD NEUROLOGICAL BOWEL NUTRITION STATUS      Continent Diet (see d/c summary)  AMBULATORY STATUS COMMUNICATION OF NEEDS Skin   Extensive Assist Verbally Normal                       Personal Care Assistance Level of Assistance  Bathing, Feeding, Dressing Bathing Assistance: Maximum assistance Feeding assistance: Independent Dressing Assistance: Limited assistance     Functional Limitations Info  Sight, Hearing, Speech Sight Info: Adequate Hearing Info: Adequate Speech Info: Adequate    SPECIAL CARE FACTORS FREQUENCY  OT (By licensed OT), PT (By licensed PT)     PT Frequency: 5x/week OT Frequency: 5x/week            Contractures Contractures Info: Not present    Additional Factors Info  Code Status, Allergies Code Status Info: Full code Allergies Info: Tetracyclines & Related, penicillins, Elavil (amitriptyline), Erythromycin, Macrodantin (nitrofurantoin Macrocrystal)           Current Medications (01/13/2024):  This is the current hospital active medication list Current Facility-Administered Medications  Medication Dose Route Frequency Provider Last Rate Last Admin   acetaminophen  (TYLENOL ) tablet 650 mg  650 mg Oral Q6H PRN Doutova, Anastassia, MD       Or   acetaminophen  (TYLENOL ) suppository 650 mg  650 mg Rectal Q6H PRN Doutova, Anastassia, MD       albuterol  (PROVENTIL ) (2.5 MG/3ML) 0.083% nebulizer solution 3 mL  3 mL Inhalation Q4H PRN Pahwani, Martha Slack,  MD       cholecalciferol (VITAMIN D3) 25 MCG (1000 UNIT) tablet 1,000 Units  1,000 Units Oral Daily Modena Andes, MD   1,000 Units at 01/13/24 1003   clopidogrel  (PLAVIX ) tablet 75 mg  75 mg Oral Daily Doutova, Anastassia, MD   75 mg at 01/13/24 1004   enoxaparin  (LOVENOX ) injection 40 mg  40 mg Subcutaneous Q24H Reome, Earle J, RPH   40 mg at 01/13/24 1004   escitalopram (LEXAPRO) tablet 5 mg  5 mg  Oral Daily Doutova, Anastassia, MD   5 mg at 01/13/24 1003   feeding supplement (ENSURE ENLIVE / ENSURE PLUS) liquid 237 mL  237 mL Oral BID BM Pahwani, Ravi, MD   237 mL at 01/13/24 1354   fentaNYL  (SUBLIMAZE ) injection 12.5-50 mcg  12.5-50 mcg Intravenous Q2H PRN Doutova, Anastassia, MD       HYDROcodone-acetaminophen  (NORCO/VICODIN) 5-325 MG per tablet 1-2 tablet  1-2 tablet Oral Q4H PRN Doutova, Anastassia, MD   1 tablet at 01/12/24 2107   losartan (COZAAR) tablet 100 mg  100 mg Oral Daily Pahwani, Ravi, MD   100 mg at 01/13/24 1004   metoprolol  succinate (TOPROL -XL) 24 hr tablet 25 mg  25 mg Oral Daily Doutova, Anastassia, MD   25 mg at 01/13/24 1003   multivitamin with minerals tablet 1 tablet  1 tablet Oral Daily Modena Andes, MD   1 tablet at 01/13/24 1003   ondansetron  (ZOFRAN ) tablet 4 mg  4 mg Oral Q6H PRN Doutova, Anastassia, MD       Or   ondansetron  (ZOFRAN ) injection 4 mg  4 mg Intravenous Q6H PRN Doutova, Anastassia, MD         Discharge Medications: Please see discharge summary for a list of discharge medications.  Relevant Imaging Results:  Relevant Lab Results:   Additional Information SSN [id number], LCSWA

## 2024-01-13 NOTE — Progress Notes (Signed)
   01/13/24 1246  Mobility  Activity Transferred to/from [facility];Transferred from bed to chair  Level of Assistance Moderate assist, patient does 50-74%  Assistive Device None  RLE Weight Bearing Per Provider Order WBAT  LLE Weight Bearing Per Provider Order WBAT  Activity Response Tolerated fair  Mobility Referral Yes  Mobility visit 1 Mobility  Mobility Specialist Start Time (ACUTE ONLY) 1246  Mobility Specialist Stop Time (ACUTE ONLY) 1313  Mobility Specialist Time Calculation (min) (ACUTE ONLY) 27 min   Mobility Specialist: Progress Note  Pt agreeable to mobility session - received in bed. C/o pain in pelvis area and needing to have a BM- RN notified via secure chat. Pt with large brown BM, pericare completed with assistance. Returned to chair with all needs met - call bell within reach. Chair alarm on.  Daughter Present.  Crystal Day, BS Mobility Specialist Please contact via SecureChat or  Rehab office at [phone].

## 2024-01-13 NOTE — TOC Progression Note (Signed)
 Transition of Care [facility]) - Progression Note    Patient Details  Name: Crystal Day MRN: [id number] Date of Birth: 08-25-1934  Transition [facility] Care [facility]) CM/SW Contact  Valley Gavia, LCSWA Phone Number: 01/13/2024, 4:09 PM  Clinical Narrative:     PASSR approved, added to FL2 and sent to [facility], pt can dc tomorrow.   Expected Discharge Plan: Skilled Nursing Facility Barriers to Discharge: Insurance Authorization, Other (must enter comment) (PASRR pending)  Expected Discharge Plan and Services                                               Social Determinants of Health (SDOH) Interventions SDOH Screenings   Food Insecurity: No Food Insecurity (01/09/2024)  Housing: Low Risk  (01/09/2024)  Transportation Needs: No Transportation Needs (01/09/2024)  Utilities: At Risk (01/09/2024)  Social Connections: Moderately Integrated (01/09/2024)  Tobacco Use: Medium Risk (01/09/2024)    Readmission Risk Interventions     No data to display

## 2024-01-14 DIAGNOSIS — Z7401 Bed confinement status: Secondary | ICD-10-CM | POA: Diagnosis not present

## 2024-01-14 DIAGNOSIS — M6281 Muscle weakness (generalized): Secondary | ICD-10-CM | POA: Diagnosis not present

## 2024-01-14 DIAGNOSIS — N1831 Chronic kidney disease, stage 3a: Secondary | ICD-10-CM | POA: Diagnosis not present

## 2024-01-14 DIAGNOSIS — M1990 Unspecified osteoarthritis, unspecified site: Secondary | ICD-10-CM | POA: Diagnosis not present

## 2024-01-14 DIAGNOSIS — I129 Hypertensive chronic kidney disease with stage 1 through stage 4 chronic kidney disease, or unspecified chronic kidney disease: Secondary | ICD-10-CM | POA: Diagnosis not present

## 2024-01-14 DIAGNOSIS — I679 Cerebrovascular disease, unspecified: Secondary | ICD-10-CM | POA: Diagnosis not present

## 2024-01-14 DIAGNOSIS — G894 Chronic pain syndrome: Secondary | ICD-10-CM | POA: Diagnosis not present

## 2024-01-14 DIAGNOSIS — R1312 Dysphagia, oropharyngeal phase: Secondary | ICD-10-CM | POA: Diagnosis not present

## 2024-01-14 DIAGNOSIS — E440 Moderate protein-calorie malnutrition: Secondary | ICD-10-CM | POA: Diagnosis not present

## 2024-01-14 DIAGNOSIS — F329 Major depressive disorder, single episode, unspecified: Secondary | ICD-10-CM | POA: Diagnosis not present

## 2024-01-14 DIAGNOSIS — R2689 Other abnormalities of gait and mobility: Secondary | ICD-10-CM | POA: Diagnosis not present

## 2024-01-14 DIAGNOSIS — I252 Old myocardial infarction: Secondary | ICD-10-CM | POA: Diagnosis not present

## 2024-01-14 DIAGNOSIS — S32591D Other specified fracture of right pubis, subsequent encounter for fracture with routine healing: Secondary | ICD-10-CM | POA: Diagnosis not present

## 2024-01-14 DIAGNOSIS — I69351 Hemiplegia and hemiparesis following cerebral infarction affecting right dominant side: Secondary | ICD-10-CM | POA: Diagnosis not present

## 2024-01-14 DIAGNOSIS — Z85828 Personal history of other malignant neoplasm of skin: Secondary | ICD-10-CM | POA: Diagnosis not present

## 2024-01-14 DIAGNOSIS — F419 Anxiety disorder, unspecified: Secondary | ICD-10-CM | POA: Diagnosis not present

## 2024-01-14 DIAGNOSIS — F0150 Vascular dementia without behavioral disturbance: Secondary | ICD-10-CM | POA: Diagnosis not present

## 2024-01-14 DIAGNOSIS — I69320 Aphasia following cerebral infarction: Secondary | ICD-10-CM | POA: Diagnosis not present

## 2024-01-14 DIAGNOSIS — S32509A Unspecified fracture of unspecified pubis, initial encounter for closed fracture: Secondary | ICD-10-CM | POA: Diagnosis not present

## 2024-01-14 DIAGNOSIS — R41841 Cognitive communication deficit: Secondary | ICD-10-CM | POA: Diagnosis not present

## 2024-01-14 DIAGNOSIS — E785 Hyperlipidemia, unspecified: Secondary | ICD-10-CM | POA: Diagnosis not present

## 2024-01-14 DIAGNOSIS — I2510 Atherosclerotic heart disease of native coronary artery without angina pectoris: Secondary | ICD-10-CM | POA: Diagnosis not present

## 2024-01-14 DIAGNOSIS — D849 Immunodeficiency, unspecified: Secondary | ICD-10-CM | POA: Diagnosis not present

## 2024-01-14 DIAGNOSIS — I10 Essential (primary) hypertension: Secondary | ICD-10-CM | POA: Diagnosis not present

## 2024-01-14 DIAGNOSIS — I69328 Other speech and language deficits following cerebral infarction: Secondary | ICD-10-CM | POA: Diagnosis not present

## 2024-01-14 DIAGNOSIS — S3289XA Fracture of other parts of pelvis, initial encounter for closed fracture: Secondary | ICD-10-CM | POA: Diagnosis not present

## 2024-01-14 DIAGNOSIS — Z9181 History of falling: Secondary | ICD-10-CM | POA: Diagnosis not present

## 2024-01-14 DIAGNOSIS — F411 Generalized anxiety disorder: Secondary | ICD-10-CM | POA: Diagnosis not present

## 2024-01-14 MED ORDER — LOSARTAN POTASSIUM 100 MG PO TABS: 100.0000 mg | ORAL_TABLET | Freq: Every day | ORAL | Status: AC

## 2024-01-14 NOTE — TOC Progression Note (Signed)
 Transition of Care [facility]) - Progression Note    Patient Details  Name: Crystal Day MRN: [id number] Date of Birth: 25-Aug-1934  Transition of Care [facility]) CM/SW Contact  Carmon Christen, LCSWA Phone Number: 01/14/2024, 10:37 AM  Clinical Narrative:     CSW spoke with Nicki with [facility] who confirmed patient can dc over today if medically stable. CSW informed MD.   Expected Discharge Plan: Skilled Nursing Facility Barriers to Discharge: Insurance Authorization, Other (must enter comment) (PASRR pending)  Expected Discharge Plan and Services         Expected Discharge Date: 01/14/24                                     Social Determinants of Health (SDOH) Interventions SDOH Screenings   Food Insecurity: No Food Insecurity (01/09/2024)  Housing: Low Risk  (01/09/2024)  Transportation Needs: No Transportation Needs (01/09/2024)  Utilities: At Risk (01/09/2024)  Social Connections: Moderately Integrated (01/09/2024)  Tobacco Use: Medium Risk (01/09/2024)    Readmission Risk Interventions     No data to display

## 2024-01-14 NOTE — Discharge Summary (Signed)
 Physician Discharge Summary   Patient: Crystal Day MRN: [id number] DOB: 05-Apr-1935  Admit date:     01/09/2024  Discharge date: 01/14/24  Discharge Physician: Jodeane Mulligan   PCP: Faustina Hood, MD   Recommendations at discharge:    Pt to be discharged to SNF[facility].   If you experience worsening fever, chills, chest pain, shortness of breath, or other concerning symptoms, please call your PCP or go to the emergency department immediately.  Discharge Diagnoses: Principal Problem:   Pubic bone fracture (HCC) Active Problems:   CKD (chronic kidney disease) stage 3, GFR 30-59 ml/min (HCC)   Aphasia   History of CVA (cerebrovascular accident)   Vascular dementia (HCC)  Resolved Problems:   * No resolved hospital problems. *   Hospital Course:  89 y.o. female with medical history significant of aphasia, vascular dementia, CVA, hypertension, anxiety, HTN, HLD who was brought into the ED after a fall at assisted living facility, she was too weak to get up from the toilet, felt dizzy prior to fall. At baseline, walks independently. Denies hitting head or LOC. Family feels her feet got tangled up as soon as the family left no syncope.   Assessment & Plan:   Right pubic rami fracture /fall/generalized weakness -Pain controlled.  PT OT assessment completed, SNF recommended STR.    Essential hypertension: Was slightly elevated only at times,Patient was not taking metoprolol  PTA however this has been resumed here.  Increased her losartan  from 50 to 100 mg.  May need to follow-up with PCP to titrate antihypertensive regimen.    History of CVA leading to aphasia: Continue Plavix .   Vascular dementia: Stable.  Avoid delirium.   CKD stage IIIb: Baseline creatinine around 1.2 and currently at baseline.   Consultants: None  Procedures performed: None Disposition: Skilled nursing facility Diet recommendation:  Discharge Diet Orders (From admission, onward)     Start     Ordered    01/14/24 0000  Diet - low sodium heart healthy        01/14/24 1002           Cardiac diet  DISCHARGE MEDICATION: Allergies as of 01/14/2024       Reactions   Penicillins Hives   Tetracyclines & Related Nausea Only   Elavil [amitriptyline] Rash   Erythromycin Rash   Macrodantin [nitrofurantoin Macrocrystal] Rash        Medication List     STOP taking these medications    cefdinir 300 MG capsule Commonly known as: OMNICEF   metoprolol  tartrate 25 MG tablet Commonly known as: LOPRESSOR    nitroGLYCERIN  0.4 MG SL tablet Commonly known as: NITROSTAT    predniSONE 20 MG tablet Commonly known as: DELTASONE   RED YEAST RICE PO       TAKE these medications    albuterol  108 (90 Base) MCG/ACT inhaler Commonly known as: VENTOLIN  HFA Inhale 2 puffs into the lungs every 4 (four) hours as needed.   clopidogrel  75 MG tablet Commonly known as: PLAVIX  Take 1 tablet (75 mg total) by mouth daily.   escitalopram  5 MG tablet Commonly known as: LEXAPRO  Take 5 mg by mouth daily.   HYDROcodone -acetaminophen  5-325 MG tablet Commonly known as: NORCO/VICODIN Take 1-2 tablets by mouth every 4 (four) hours as needed for moderate pain (pain score 4-6).   losartan  100 MG tablet Commonly known as: COZAAR  Take 1 tablet (100 mg total) by mouth daily. What changed:  medication strength how much to take   metoprolol  succinate  25 MG 24 hr tablet Commonly known as: TOPROL -XL Take 25 mg by mouth daily.         Discharge Exam: Filed Weights   01/09/24 1802  Weight: 61.2 kg    GENERAL:  Alert, pleasant, no acute distress, frail HEENT:  EOMI CARDIOVASCULAR:  RRR, no murmurs appreciated RESPIRATORY:  Clear to auscultation, no wheezing, rales, or rhonchi GASTROINTESTINAL:  Soft, nontender, nondistended EXTREMITIES:  No LE edema bilaterally NEURO:  No new focal deficits appreciated SKIN:  No rashes noted PSYCH:  Appropriate mood and affect     Condition at  discharge: improving  The results of significant diagnostics from this hospitalization (including imaging, microbiology, ancillary and laboratory) are listed below for reference.   Imaging Studies: CT Head Wo Contrast Result Date: 01/09/2024 CLINICAL DATA:  Fall, trauma EXAM: CT HEAD WITHOUT CONTRAST CT CERVICAL SPINE WITHOUT CONTRAST TECHNIQUE: Multidetector CT imaging of the head and cervical spine was performed following the standard protocol without intravenous contrast. Multiplanar CT image reconstructions of the cervical spine were also generated. RADIATION DOSE REDUCTION: This exam was performed according to the departmental dose-optimization program which includes automated exposure control, adjustment of the mA and/or kV according to patient size and/or use of iterative reconstruction technique. COMPARISON:  04/08/2021 FINDINGS: CT HEAD FINDINGS Brain: No evidence of acute infarction, hemorrhage, hydrocephalus, extra-axial collection or mass lesion/mass effect. Periventricular and deep white matter hypodensity. Multiple lacunar infarctions of the basal ganglia, corona radiata, and thalami as well as the cerebellar hemisphere series. Vascular: No hyperdense vessel or unexpected calcification. Skull: Normal. Negative for fracture or focal lesion. Sinuses/Orbits: No acute finding. Other: None. CT CERVICAL SPINE FINDINGS Alignment: Degenerative straightening of the normal cervical lordosis. Skull base and vertebrae: No acute fracture. No primary bone lesion or focal pathologic process. Soft tissues and spinal canal: No prevertebral fluid or swelling. No visible canal hematoma. Disc levels: Mild-to-moderate multilevel cervical disc degenerative disease, worst from C5-C7 Upper chest: Negative. Other: None. IMPRESSION: 1. No acute intracranial pathology. Small-vessel white matter disease and multiple lacunar infarctions. 2. No fracture or static subluxation of the cervical spine. 3. Mild-to-moderate  multilevel cervical disc degenerative disease, worst from C5-C7. Electronically Signed   By: Fredricka Jenny M.D.   On: 01/09/2024 19:38   CT Cervical Spine Wo Contrast Result Date: 01/09/2024 CLINICAL DATA:  Fall, trauma EXAM: CT HEAD WITHOUT CONTRAST CT CERVICAL SPINE WITHOUT CONTRAST TECHNIQUE: Multidetector CT imaging of the head and cervical spine was performed following the standard protocol without intravenous contrast. Multiplanar CT image reconstructions of the cervical spine were also generated. RADIATION DOSE REDUCTION: This exam was performed according to the departmental dose-optimization program which includes automated exposure control, adjustment of the mA and/or kV according to patient size and/or use of iterative reconstruction technique. COMPARISON:  04/08/2021 FINDINGS: CT HEAD FINDINGS Brain: No evidence of acute infarction, hemorrhage, hydrocephalus, extra-axial collection or mass lesion/mass effect. Periventricular and deep white matter hypodensity. Multiple lacunar infarctions of the basal ganglia, corona radiata, and thalami as well as the cerebellar hemisphere series. Vascular: No hyperdense vessel or unexpected calcification. Skull: Normal. Negative for fracture or focal lesion. Sinuses/Orbits: No acute finding. Other: None. CT CERVICAL SPINE FINDINGS Alignment: Degenerative straightening of the normal cervical lordosis. Skull base and vertebrae: No acute fracture. No primary bone lesion or focal pathologic process. Soft tissues and spinal canal: No prevertebral fluid or swelling. No visible canal hematoma. Disc levels: Mild-to-moderate multilevel cervical disc degenerative disease, worst from C5-C7 Upper chest: Negative. Other: None. IMPRESSION: 1. No  acute intracranial pathology. Small-vessel white matter disease and multiple lacunar infarctions. 2. No fracture or static subluxation of the cervical spine. 3. Mild-to-moderate multilevel cervical disc degenerative disease, worst from  C5-C7. Electronically Signed   By: Fredricka Jenny M.D.   On: 01/09/2024 19:38   DG Hip Unilat With Pelvis 2-3 Views Right Result Date: 01/09/2024 CLINICAL DATA:  Pain after fall EXAM: DG HIP (WITH OR WITHOUT PELVIS) 3V RIGHT COMPARISON:  None Available. FINDINGS: There is a fracture of the right inferior pubic ramus. No additional fracture or dislocation. Preserved joint spaces. Osteopenia. Vascular calcifications in the pelvis. Degenerative changes of the visualized portions of the lumbar spine. Overlapping cardiac leads. If there is further concern of hip fracture additional cross-sectional imaging study could be considered as clinically appropriate further delineation in the setting of osteopenia. IMPRESSION: Fracture of the right inferior pubic ramus.  Osteopenia. Electronically Signed   By: Adrianna Horde M.D.   On: 01/09/2024 19:18   DG Chest Port 1 View Result Date: 01/09/2024 CLINICAL DATA:  Pain after fall EXAM: PORTABLE CHEST 1 VIEW COMPARISON:  X-ray 08/30/2022. FINDINGS: Hyperinflation. No consolidation, pneumothorax or effusion. Normal cardiopericardial silhouette with a calcified aorta. Apical pleural thickening. Overlapping cardiac leads. The left inferior costophrenic angle is clipped off the edge of the film. Degenerative changes along the spine. If there is further concern of the sequela of trauma additional CT workup as clinically appropriate for further delineation. IMPRESSION: Hyperinflation.  Chronic changes.  No acute cardiopulmonary disease. Electronically Signed   By: Adrianna Horde M.D.   On: 01/09/2024 19:17    Microbiology: Results for orders placed or performed during the hospital encounter of 08/30/22  Resp panel by RT-PCR (RSV, Flu A&B, Covid) Anterior Nasal Swab     Status: Abnormal   Collection Time: 08/30/22  8:22 PM   Specimen: Anterior Nasal Swab  Result Value Ref Range Status   SARS Coronavirus 2 by RT PCR POSITIVE (A) NEGATIVE Final    Comment: (NOTE) SARS-CoV-2  target nucleic acids are DETECTED.  The SARS-CoV-2 RNA is generally detectable in upper respiratory specimens during the acute phase of infection. Positive results are indicative of the presence of the identified virus, but do not rule out bacterial infection or co-infection with other pathogens not detected by the test. Clinical correlation with patient history and other diagnostic information is necessary to determine patient infection status. The expected result is Negative.  Fact Sheet for Patients: BloggerCourse.com  Fact Sheet for Healthcare Providers: SeriousBroker.it  This test is not yet approved or cleared by the [place]  FDA and  has been authorized for detection and/or diagnosis of SARS-CoV-2 by FDA under an Emergency Use Authorization (EUA).  This EUA will remain in effect (meaning this test can be used) for the duration of  the COVID-19 declaration under Section 564(b)(1) of the A ct, 21 U.S.C. section 360bbb-3(b)(1), unless the authorization is terminated or revoked sooner.     Influenza A by PCR NEGATIVE NEGATIVE Final   Influenza B by PCR NEGATIVE NEGATIVE Final    Comment: (NOTE) The Xpert Xpress SARS-CoV-2/FLU/RSV plus assay is intended as an aid in the diagnosis of influenza from Nasopharyngeal swab specimens and should not be used as a sole basis for treatment. Nasal washings and aspirates are unacceptable for Xpert Xpress SARS-CoV-2/FLU/RSV testing.  Fact Sheet for Patients: BloggerCourse.com  Fact Sheet for Healthcare Providers: SeriousBroker.it  This test is not yet approved or cleared by the [place]  FDA and has been authorized for  detection and/or diagnosis of SARS-CoV-2 by FDA under an Emergency Use Authorization (EUA). This EUA will remain in effect (meaning this test can be used) for the duration of the COVID-19 declaration under Section  564(b)(1) of the Act, 21 U.S.C. section 360bbb-3(b)(1), unless the authorization is terminated or revoked.     Resp Syncytial Virus by PCR NEGATIVE NEGATIVE Final    Comment: (NOTE) Fact Sheet for Patients: BloggerCourse.com  Fact Sheet for Healthcare Providers: SeriousBroker.it  This test is not yet approved or cleared by the [place]  FDA and has been authorized for detection and/or diagnosis of SARS-CoV-2 by FDA under an Emergency Use Authorization (EUA). This EUA will remain in effect (meaning this test can be used) for the duration of the COVID-19 declaration under Section 564(b)(1) of the Act, 21 U.S.C. section 360bbb-3(b)(1), unless the authorization is terminated or revoked.  Performed at [facility], [address] [place], [place] [address]     Labs: CBC: Recent Labs  Lab 01/09/24 1836 01/10/24 0644  WBC 11.0* 7.5  NEUTROABS 9.9*  --   HGB 14.5 12.1  HCT 44.4 36.2  MCV 88.4 87.7  PLT 156 136*   Basic Metabolic Panel: Recent Labs  Lab 01/09/24 1836 01/10/24 0644 01/11/24 0505  NA 140 138 140  K 4.0 3.7 3.9  CL 102 105 106  CO2 26 29 28   GLUCOSE 136* 103* 114*  BUN 19 18 20   CREATININE 1.22* 1.23* 1.14*  CALCIUM  9.5 8.3* 8.7*  MG  --  1.9  --   PHOS  --  2.9  --    Liver Function Tests: Recent Labs  Lab 01/09/24 1836 01/10/24 0644  AST 29 19  ALT 14 9  ALKPHOS 40 31*  BILITOT 1.0 1.2  PROT 6.6 5.3*  ALBUMIN 3.6 2.9*   CBG: Recent Labs  Lab 01/09/24 1832  GLUCAP 147*    Discharge time spent: 26 minutes.  Length of stay: 5 days  Signed: Jodeane Mulligan, DO [facility] 01/14/2024

## 2024-01-14 NOTE — Progress Notes (Signed)
 Mobility Specialist Progress Note:    01/14/24 0940  Mobility  Activity Transferred to/from [facility]  Level of Assistance Minimal assist, patient does 75% or more  Assistive Device Front wheel walker  Distance Ambulated (ft) 5 ft  RLE Weight Bearing Per Provider Order WBAT  LLE Weight Bearing Per Provider Order WBAT  Activity Response Tolerated well  Mobility Referral Yes  Mobility visit 1 Mobility  Mobility Specialist Start Time (ACUTE ONLY) 0930  Mobility Specialist Stop Time (ACUTE ONLY) 0945  Mobility Specialist Time Calculation (min) (ACUTE ONLY) 15 min   Pt received getting off BSC w/ NT, agreeable to mobility. Pt was able to take a couple steps towards the chair w/o fault. Had a much easier time clearing R foot. Left in chair w/ call bell and personal belongings in reach. All needs met. NT in room.  Inetta Manes Mobility Specialist  Please contact vis Secure Chat or  Rehab Office [phone]

## 2024-01-14 NOTE — TOC Transition Note (Signed)
 Transition of Care [facility]) - Discharge Note   Patient Details  Name: Crystal Day MRN: [id number] Date of Birth: [birth year]  Transition of Care [facility] CM/SW Contact:  Carmon Christen, LCSWA Phone Number: 01/14/2024, 11:31 AM   Clinical Narrative:     Patient will DC to: [facility]   Anticipated DC date: 01/14/2024   Family notified: Gwinda Leopard   Transport by: Lyna Sandhoff  ?  Per MD patient ready for DC to [facility] . RN, patient, patient's family, and facility notified of DC. Discharge Summary sent to facility. RN given number for report [phone]. DC packet on chart. Ambulance transport requested for patient.  CSW signing off.   Final next level of care: Skilled Nursing Facility Barriers to Discharge: No Barriers Identified   Patient Goals and CMS Choice     Choice offered to / list presented to : Adult Children (daughter Veverly Grace)      Discharge Placement              Patient chooses bed at: [facility] Patient to be transferred to facility by: [facility] Name of family member notified: Shiela Patient and family notified of of transfer: 01/14/24  Discharge Plan and Services Additional resources added to the After Visit Summary for                                       Social Drivers of Health (SDOH) Interventions SDOH Screenings   Food Insecurity: No Food Insecurity (01/09/2024)  Housing: Low Risk  (01/09/2024)  Transportation Needs: No Transportation Needs (01/09/2024)  Utilities: At Risk (01/09/2024)  Social Connections: Moderately Integrated (01/09/2024)  Tobacco Use: Medium Risk (01/09/2024)     Readmission Risk Interventions     No data to display

## 2024-01-14 NOTE — Progress Notes (Signed)
 Discharge order received.  Report called and given to [facility], LPN.  Elberta Grebe (Daughter-legal guardian) was called and notified of pt discharge.        Daughter had already been notified from SW. Per daughter, she had already taken pt's valuables home with her.  Saline lock removed.  Awaiting PTAR.

## 2024-01-17 DIAGNOSIS — I679 Cerebrovascular disease, unspecified: Secondary | ICD-10-CM | POA: Diagnosis not present

## 2024-01-17 DIAGNOSIS — G894 Chronic pain syndrome: Secondary | ICD-10-CM | POA: Diagnosis not present

## 2024-01-17 DIAGNOSIS — M6281 Muscle weakness (generalized): Secondary | ICD-10-CM | POA: Diagnosis not present

## 2024-01-17 DIAGNOSIS — F411 Generalized anxiety disorder: Secondary | ICD-10-CM | POA: Diagnosis not present

## 2024-01-17 DIAGNOSIS — S32591D Other specified fracture of right pubis, subsequent encounter for fracture with routine healing: Secondary | ICD-10-CM | POA: Diagnosis not present

## 2024-01-17 DIAGNOSIS — I10 Essential (primary) hypertension: Secondary | ICD-10-CM | POA: Diagnosis not present

## 2024-01-17 DIAGNOSIS — F329 Major depressive disorder, single episode, unspecified: Secondary | ICD-10-CM | POA: Diagnosis not present

## 2024-01-17 DIAGNOSIS — F0150 Vascular dementia without behavioral disturbance: Secondary | ICD-10-CM | POA: Diagnosis not present

## 2024-01-19 DIAGNOSIS — F0150 Vascular dementia without behavioral disturbance: Secondary | ICD-10-CM | POA: Diagnosis not present

## 2024-01-19 DIAGNOSIS — S32509A Unspecified fracture of unspecified pubis, initial encounter for closed fracture: Secondary | ICD-10-CM | POA: Diagnosis not present

## 2024-01-19 DIAGNOSIS — F419 Anxiety disorder, unspecified: Secondary | ICD-10-CM | POA: Diagnosis not present

## 2024-01-19 DIAGNOSIS — I69351 Hemiplegia and hemiparesis following cerebral infarction affecting right dominant side: Secondary | ICD-10-CM | POA: Diagnosis not present

## 2024-01-19 DIAGNOSIS — R41841 Cognitive communication deficit: Secondary | ICD-10-CM | POA: Diagnosis not present

## 2024-01-19 DIAGNOSIS — N1831 Chronic kidney disease, stage 3a: Secondary | ICD-10-CM | POA: Diagnosis not present

## 2024-01-19 DIAGNOSIS — I10 Essential (primary) hypertension: Secondary | ICD-10-CM | POA: Diagnosis not present

## 2024-01-20 DIAGNOSIS — F329 Major depressive disorder, single episode, unspecified: Secondary | ICD-10-CM | POA: Diagnosis not present

## 2024-01-20 DIAGNOSIS — G894 Chronic pain syndrome: Secondary | ICD-10-CM | POA: Diagnosis not present

## 2024-01-20 DIAGNOSIS — I679 Cerebrovascular disease, unspecified: Secondary | ICD-10-CM | POA: Diagnosis not present

## 2024-01-20 DIAGNOSIS — I10 Essential (primary) hypertension: Secondary | ICD-10-CM | POA: Diagnosis not present

## 2024-01-20 DIAGNOSIS — F411 Generalized anxiety disorder: Secondary | ICD-10-CM | POA: Diagnosis not present

## 2024-01-20 DIAGNOSIS — S32591D Other specified fracture of right pubis, subsequent encounter for fracture with routine healing: Secondary | ICD-10-CM | POA: Diagnosis not present

## 2024-01-20 DIAGNOSIS — F0150 Vascular dementia without behavioral disturbance: Secondary | ICD-10-CM | POA: Diagnosis not present

## 2024-01-20 DIAGNOSIS — M6281 Muscle weakness (generalized): Secondary | ICD-10-CM | POA: Diagnosis not present

## 2024-01-21 DIAGNOSIS — F329 Major depressive disorder, single episode, unspecified: Secondary | ICD-10-CM | POA: Diagnosis not present

## 2024-01-21 DIAGNOSIS — F0150 Vascular dementia without behavioral disturbance: Secondary | ICD-10-CM | POA: Diagnosis not present

## 2024-01-21 DIAGNOSIS — G894 Chronic pain syndrome: Secondary | ICD-10-CM | POA: Diagnosis not present

## 2024-01-21 DIAGNOSIS — I10 Essential (primary) hypertension: Secondary | ICD-10-CM | POA: Diagnosis not present

## 2024-01-21 DIAGNOSIS — M6281 Muscle weakness (generalized): Secondary | ICD-10-CM | POA: Diagnosis not present

## 2024-01-21 DIAGNOSIS — F411 Generalized anxiety disorder: Secondary | ICD-10-CM | POA: Diagnosis not present

## 2024-01-21 DIAGNOSIS — S32591D Other specified fracture of right pubis, subsequent encounter for fracture with routine healing: Secondary | ICD-10-CM | POA: Diagnosis not present

## 2024-01-21 DIAGNOSIS — I679 Cerebrovascular disease, unspecified: Secondary | ICD-10-CM | POA: Diagnosis not present

## 2024-01-24 DIAGNOSIS — M6281 Muscle weakness (generalized): Secondary | ICD-10-CM | POA: Diagnosis not present

## 2024-01-24 DIAGNOSIS — F329 Major depressive disorder, single episode, unspecified: Secondary | ICD-10-CM | POA: Diagnosis not present

## 2024-01-24 DIAGNOSIS — I10 Essential (primary) hypertension: Secondary | ICD-10-CM | POA: Diagnosis not present

## 2024-01-24 DIAGNOSIS — I679 Cerebrovascular disease, unspecified: Secondary | ICD-10-CM | POA: Diagnosis not present

## 2024-01-24 DIAGNOSIS — G894 Chronic pain syndrome: Secondary | ICD-10-CM | POA: Diagnosis not present

## 2024-01-24 DIAGNOSIS — F0150 Vascular dementia without behavioral disturbance: Secondary | ICD-10-CM | POA: Diagnosis not present

## 2024-01-24 DIAGNOSIS — F411 Generalized anxiety disorder: Secondary | ICD-10-CM | POA: Diagnosis not present

## 2024-01-24 DIAGNOSIS — S32591D Other specified fracture of right pubis, subsequent encounter for fracture with routine healing: Secondary | ICD-10-CM | POA: Diagnosis not present

## 2024-01-25 DIAGNOSIS — M6281 Muscle weakness (generalized): Secondary | ICD-10-CM | POA: Diagnosis not present

## 2024-01-25 DIAGNOSIS — I679 Cerebrovascular disease, unspecified: Secondary | ICD-10-CM | POA: Diagnosis not present

## 2024-01-25 DIAGNOSIS — G894 Chronic pain syndrome: Secondary | ICD-10-CM | POA: Diagnosis not present

## 2024-01-25 DIAGNOSIS — S32591D Other specified fracture of right pubis, subsequent encounter for fracture with routine healing: Secondary | ICD-10-CM | POA: Diagnosis not present

## 2024-01-25 DIAGNOSIS — I10 Essential (primary) hypertension: Secondary | ICD-10-CM | POA: Diagnosis not present

## 2024-01-25 DIAGNOSIS — F0150 Vascular dementia without behavioral disturbance: Secondary | ICD-10-CM | POA: Diagnosis not present

## 2024-01-25 DIAGNOSIS — F329 Major depressive disorder, single episode, unspecified: Secondary | ICD-10-CM | POA: Diagnosis not present

## 2024-01-25 DIAGNOSIS — F411 Generalized anxiety disorder: Secondary | ICD-10-CM | POA: Diagnosis not present

## 2024-01-27 DIAGNOSIS — I10 Essential (primary) hypertension: Secondary | ICD-10-CM | POA: Diagnosis not present

## 2024-01-27 DIAGNOSIS — F329 Major depressive disorder, single episode, unspecified: Secondary | ICD-10-CM | POA: Diagnosis not present

## 2024-01-27 DIAGNOSIS — G894 Chronic pain syndrome: Secondary | ICD-10-CM | POA: Diagnosis not present

## 2024-01-27 DIAGNOSIS — M6281 Muscle weakness (generalized): Secondary | ICD-10-CM | POA: Diagnosis not present

## 2024-01-27 DIAGNOSIS — F0150 Vascular dementia without behavioral disturbance: Secondary | ICD-10-CM | POA: Diagnosis not present

## 2024-01-27 DIAGNOSIS — F411 Generalized anxiety disorder: Secondary | ICD-10-CM | POA: Diagnosis not present

## 2024-01-27 DIAGNOSIS — S32591D Other specified fracture of right pubis, subsequent encounter for fracture with routine healing: Secondary | ICD-10-CM | POA: Diagnosis not present

## 2024-01-27 DIAGNOSIS — I679 Cerebrovascular disease, unspecified: Secondary | ICD-10-CM | POA: Diagnosis not present

## 2024-01-31 DIAGNOSIS — F0150 Vascular dementia without behavioral disturbance: Secondary | ICD-10-CM | POA: Diagnosis not present

## 2024-01-31 DIAGNOSIS — G894 Chronic pain syndrome: Secondary | ICD-10-CM | POA: Diagnosis not present

## 2024-01-31 DIAGNOSIS — I679 Cerebrovascular disease, unspecified: Secondary | ICD-10-CM | POA: Diagnosis not present

## 2024-01-31 DIAGNOSIS — S32591D Other specified fracture of right pubis, subsequent encounter for fracture with routine healing: Secondary | ICD-10-CM | POA: Diagnosis not present

## 2024-01-31 DIAGNOSIS — F329 Major depressive disorder, single episode, unspecified: Secondary | ICD-10-CM | POA: Diagnosis not present

## 2024-01-31 DIAGNOSIS — M6281 Muscle weakness (generalized): Secondary | ICD-10-CM | POA: Diagnosis not present

## 2024-01-31 DIAGNOSIS — I10 Essential (primary) hypertension: Secondary | ICD-10-CM | POA: Diagnosis not present

## 2024-01-31 DIAGNOSIS — F411 Generalized anxiety disorder: Secondary | ICD-10-CM | POA: Diagnosis not present

## 2024-02-01 DIAGNOSIS — M6281 Muscle weakness (generalized): Secondary | ICD-10-CM | POA: Diagnosis not present

## 2024-02-01 DIAGNOSIS — I679 Cerebrovascular disease, unspecified: Secondary | ICD-10-CM | POA: Diagnosis not present

## 2024-02-01 DIAGNOSIS — F329 Major depressive disorder, single episode, unspecified: Secondary | ICD-10-CM | POA: Diagnosis not present

## 2024-02-01 DIAGNOSIS — G894 Chronic pain syndrome: Secondary | ICD-10-CM | POA: Diagnosis not present

## 2024-02-01 DIAGNOSIS — F411 Generalized anxiety disorder: Secondary | ICD-10-CM | POA: Diagnosis not present

## 2024-02-01 DIAGNOSIS — F0150 Vascular dementia without behavioral disturbance: Secondary | ICD-10-CM | POA: Diagnosis not present

## 2024-02-01 DIAGNOSIS — S32591D Other specified fracture of right pubis, subsequent encounter for fracture with routine healing: Secondary | ICD-10-CM | POA: Diagnosis not present

## 2024-02-01 DIAGNOSIS — I10 Essential (primary) hypertension: Secondary | ICD-10-CM | POA: Diagnosis not present

## 2024-02-03 DIAGNOSIS — I10 Essential (primary) hypertension: Secondary | ICD-10-CM | POA: Diagnosis not present

## 2024-02-03 DIAGNOSIS — F411 Generalized anxiety disorder: Secondary | ICD-10-CM | POA: Diagnosis not present

## 2024-02-03 DIAGNOSIS — F329 Major depressive disorder, single episode, unspecified: Secondary | ICD-10-CM | POA: Diagnosis not present

## 2024-02-03 DIAGNOSIS — G894 Chronic pain syndrome: Secondary | ICD-10-CM | POA: Diagnosis not present

## 2024-02-03 DIAGNOSIS — M6281 Muscle weakness (generalized): Secondary | ICD-10-CM | POA: Diagnosis not present

## 2024-02-03 DIAGNOSIS — S32591D Other specified fracture of right pubis, subsequent encounter for fracture with routine healing: Secondary | ICD-10-CM | POA: Diagnosis not present

## 2024-02-03 DIAGNOSIS — I679 Cerebrovascular disease, unspecified: Secondary | ICD-10-CM | POA: Diagnosis not present

## 2024-02-03 DIAGNOSIS — F0150 Vascular dementia without behavioral disturbance: Secondary | ICD-10-CM | POA: Diagnosis not present

## 2024-02-05 DIAGNOSIS — N1831 Chronic kidney disease, stage 3a: Secondary | ICD-10-CM | POA: Diagnosis not present

## 2024-02-05 DIAGNOSIS — I69351 Hemiplegia and hemiparesis following cerebral infarction affecting right dominant side: Secondary | ICD-10-CM | POA: Diagnosis not present

## 2024-02-05 DIAGNOSIS — F0153 Vascular dementia, unspecified severity, with mood disturbance: Secondary | ICD-10-CM | POA: Diagnosis not present

## 2024-02-05 DIAGNOSIS — E785 Hyperlipidemia, unspecified: Secondary | ICD-10-CM | POA: Diagnosis not present

## 2024-02-05 DIAGNOSIS — Z9181 History of falling: Secondary | ICD-10-CM | POA: Diagnosis not present

## 2024-02-05 DIAGNOSIS — S32501D Unspecified fracture of right pubis, subsequent encounter for fracture with routine healing: Secondary | ICD-10-CM | POA: Diagnosis not present

## 2024-02-05 DIAGNOSIS — F411 Generalized anxiety disorder: Secondary | ICD-10-CM | POA: Diagnosis not present

## 2024-02-05 DIAGNOSIS — Z7902 Long term (current) use of antithrombotics/antiplatelets: Secondary | ICD-10-CM | POA: Diagnosis not present

## 2024-02-05 DIAGNOSIS — I129 Hypertensive chronic kidney disease with stage 1 through stage 4 chronic kidney disease, or unspecified chronic kidney disease: Secondary | ICD-10-CM | POA: Diagnosis not present

## 2024-02-05 DIAGNOSIS — F329 Major depressive disorder, single episode, unspecified: Secondary | ICD-10-CM | POA: Diagnosis not present

## 2024-02-05 DIAGNOSIS — G894 Chronic pain syndrome: Secondary | ICD-10-CM | POA: Diagnosis not present

## 2024-02-05 DIAGNOSIS — I69320 Aphasia following cerebral infarction: Secondary | ICD-10-CM | POA: Diagnosis not present

## 2024-02-05 DIAGNOSIS — F0154 Vascular dementia, unspecified severity, with anxiety: Secondary | ICD-10-CM | POA: Diagnosis not present

## 2024-02-10 DIAGNOSIS — S32599D Other specified fracture of unspecified pubis, subsequent encounter for fracture with routine healing: Secondary | ICD-10-CM | POA: Diagnosis not present

## 2024-02-10 DIAGNOSIS — I10 Essential (primary) hypertension: Secondary | ICD-10-CM | POA: Diagnosis not present

## 2024-02-10 DIAGNOSIS — Z09 Encounter for follow-up examination after completed treatment for conditions other than malignant neoplasm: Secondary | ICD-10-CM | POA: Diagnosis not present

## 2024-03-07 ENCOUNTER — Other Ambulatory Visit: Payer: Self-pay

## 2024-03-07 ENCOUNTER — Encounter (HOSPITAL_BASED_OUTPATIENT_CLINIC_OR_DEPARTMENT_OTHER): Payer: Self-pay | Admitting: Emergency Medicine

## 2024-03-07 ENCOUNTER — Emergency Department (HOSPITAL_BASED_OUTPATIENT_CLINIC_OR_DEPARTMENT_OTHER)

## 2024-03-07 ENCOUNTER — Emergency Department (HOSPITAL_BASED_OUTPATIENT_CLINIC_OR_DEPARTMENT_OTHER)
Admission: EM | Admit: 2024-03-07 | Discharge: 2024-03-08 | Disposition: A | Discharge status: Home / Self Care | Attending: Emergency Medicine | Admitting: Emergency Medicine

## 2024-03-07 DIAGNOSIS — Z7901 Long term (current) use of anticoagulants: Secondary | ICD-10-CM | POA: Diagnosis not present

## 2024-03-07 DIAGNOSIS — W01198A Fall on same level from slipping, tripping and stumbling with subsequent striking against other object, initial encounter: Secondary | ICD-10-CM | POA: Insufficient documentation

## 2024-03-07 DIAGNOSIS — R4701 Aphasia: Secondary | ICD-10-CM | POA: Diagnosis not present

## 2024-03-07 DIAGNOSIS — S0003XA Contusion of scalp, initial encounter: Secondary | ICD-10-CM | POA: Diagnosis not present

## 2024-03-07 DIAGNOSIS — S0990XA Unspecified injury of head, initial encounter: Secondary | ICD-10-CM | POA: Diagnosis not present

## 2024-03-07 DIAGNOSIS — S199XXA Unspecified injury of neck, initial encounter: Secondary | ICD-10-CM | POA: Diagnosis not present

## 2024-03-07 NOTE — ED Triage Notes (Signed)
 Fall. Around 4 pm. Unwitnessed Hit head ( bump on back of head) Denies LOC Some aphasia from prior stroke   Patient says she feel better then before/  On plavix 

## 2024-03-08 NOTE — ED Provider Notes (Signed)
 [place] EMERGENCY DEPARTMENT AT [facility] Provider Note   CSN: [id number] Arrival date & time: 03/07/24  2120     Patient presents with: Fall   Crystal Day is a 89 y.o. female.   Presents to the emergency department for evaluation after a fall.  Patient had an unwitnessed fall earlier today around 4 PM.  She hit the back of her head.  She does take Plavix .  No loss of consciousness.  Patient without complaints at arrival.       Prior to Admission medications   Medication Sig Start Date End Date Taking? Authorizing Provider  albuterol  (VENTOLIN  HFA) 108 (90 Base) MCG/ACT inhaler Inhale 2 puffs into the lungs every 4 (four) hours as needed.    [provider]  clopidogrel  (PLAVIX ) 75 MG tablet Take 1 tablet (75 mg total) by mouth daily. 04/15/23   Sater, Charlie LABOR, MD  escitalopram  (LEXAPRO ) 5 MG tablet Take 5 mg by mouth daily.    [provider]  HYDROcodone -acetaminophen  (NORCO/VICODIN) 5-325 MG tablet Take 1-2 tablets by mouth every 4 (four) hours as needed for moderate pain (pain score 4-6). 01/12/24   Vernon Ranks, MD  losartan  (COZAAR ) 100 MG tablet Take 1 tablet (100 mg total) by mouth daily. 01/14/24   Arlon Carliss ORN, DO  metoprolol  succinate (TOPROL -XL) 25 MG 24 hr tablet Take 25 mg by mouth daily. 11/30/23   [provider]    Allergies: Penicillins, Tetracyclines & related, Elavil [amitriptyline], Erythromycin, and Macrodantin [nitrofurantoin macrocrystal]    Review of Systems  Updated Vital Signs BP (!) 181/154   Pulse (!) 57   Temp 97.8 F (36.6 C) (Oral)   Resp 18   SpO2 94%   Physical Exam Vitals and nursing note reviewed.  Constitutional:      General: She is not in acute distress.    Appearance: She is well-developed.  HENT:     Head: Normocephalic. Contusion present.      Mouth/Throat:     Mouth: Mucous membranes are moist.  Eyes:     General: Vision grossly intact. Gaze aligned appropriately.      Extraocular Movements: Extraocular movements intact.     Conjunctiva/sclera: Conjunctivae normal.  Cardiovascular:     Rate and Rhythm: Normal rate and regular rhythm.     Pulses: Normal pulses.     Heart sounds: Normal heart sounds, S1 normal and S2 normal. No murmur heard.    No friction rub. No gallop.  Pulmonary:     Effort: Pulmonary effort is normal. No respiratory distress.     Breath sounds: Normal breath sounds.  Abdominal:     General: Bowel sounds are normal.     Palpations: Abdomen is soft.     Tenderness: There is no abdominal tenderness. There is no guarding or rebound.     Hernia: No hernia is present.  Musculoskeletal:        General: No swelling.     Cervical back: Full passive range of motion without pain, normal range of motion and neck supple. No spinous process tenderness or muscular tenderness. Normal range of motion.     Right lower leg: No edema.     Left lower leg: No edema.  Skin:    General: Skin is warm and dry.     Capillary Refill: Capillary refill takes less than 2 seconds.     Findings: No ecchymosis, erythema, rash or wound.  Neurological:     General: No focal deficit present.  Mental Status: She is alert and oriented to person, place, and time.     GCS: GCS eye subscore is 4. GCS verbal subscore is 5. GCS motor subscore is 6.     Cranial Nerves: Cranial nerves 2-12 are intact.     Sensory: Sensation is intact.     Motor: Motor function is intact.     Coordination: Coordination is intact.  Psychiatric:        Attention and Perception: Attention normal.        Mood and Affect: Mood normal.        Speech: Speech normal.        Behavior: Behavior normal.     (all labs ordered are listed, but only abnormal results are displayed) Labs Reviewed - No data to display  EKG: None  Radiology: CT Head Wo Contrast Result Date: 03/07/2024 EXAM: CT HEAD AND CERVICAL SPINE 03/07/2024 10:06:08 PM TECHNIQUE: CT of the head and cervical spine was  performed without the administration of intravenous contrast. Multiplanar reformatted images are provided for review. Automated exposure control, iterative reconstruction, and/or weight based adjustment of the mA/kV was utilized to reduce the radiation dose to as low as reasonably achievable. COMPARISON: 01/09/2024 CLINICAL HISTORY: Head trauma, minor, normal mental status (Age 63-64y); fall on thinners. Fall around 4 pm. Unwitnessed. Hit head (bump on back of head). Denies LOC. Some aphasia from prior stroke. Patient says she feels better than before. On plavix . FINDINGS: CT HEAD BRAIN AND VENTRICLES: No acute intracranial hemorrhage. No mass effect or midline shift. No abnormal extra-axial fluid collection. Gray-white differentiation is maintained. No hydrocephalus. Global cortical atrophy. Subcortical and periventricular small vessel ischemic changes. Old right basal ganglia and bilateral thalamic lacunar infarcts. Old bilateral cerebellar lacunar infarcts. ORBITS: No acute abnormality. SINUSES AND MASTOIDS: No acute abnormality. SOFT TISSUES AND SKULL: No acute skull fracture. No acute soft tissue abnormality. CT CERVICAL SPINE BONES AND ALIGNMENT: No acute fracture or traumatic malalignment. DEGENERATIVE CHANGES: Mild degenerative changes of the mid cervical spine. SOFT TISSUES: No prevertebral soft tissue swelling. IMPRESSION: 1. No acute intracranial abnormality. Atrophy with small vessel ischemic changes. Old bilateral lacunar infarcts. 2. No traumatic injury to the cervical spine. Electronically signed by: Pinkie Pebbles MD 03/07/2024 10:17 PM EDT RP Workstation: [id number]   CT Cervical Spine Wo Contrast Result Date: 03/07/2024 EXAM: CT HEAD AND CERVICAL SPINE 03/07/2024 10:06:08 PM TECHNIQUE: CT of the head and cervical spine was performed without the administration of intravenous contrast. Multiplanar reformatted images are provided for review. Automated exposure control, iterative reconstruction,  and/or weight based adjustment of the mA/kV was utilized to reduce the radiation dose to as low as reasonably achievable. COMPARISON: 01/09/2024 CLINICAL HISTORY: Head trauma, minor, normal mental status (Age [age]y); fall on thinners. Fall around 4 pm. Unwitnessed. Hit head (bump on back of head). Denies LOC. Some aphasia from prior stroke. Patient says she feels better than before. On plavix . FINDINGS: CT HEAD BRAIN AND VENTRICLES: No acute intracranial hemorrhage. No mass effect or midline shift. No abnormal extra-axial fluid collection. Gray-white differentiation is maintained. No hydrocephalus. Global cortical atrophy. Subcortical and periventricular small vessel ischemic changes. Old right basal ganglia and bilateral thalamic lacunar infarcts. Old bilateral cerebellar lacunar infarcts. ORBITS: No acute abnormality. SINUSES AND MASTOIDS: No acute abnormality. SOFT TISSUES AND SKULL: No acute skull fracture. No acute soft tissue abnormality. CT CERVICAL SPINE BONES AND ALIGNMENT: No acute fracture or traumatic malalignment. DEGENERATIVE CHANGES: Mild degenerative changes of the mid cervical spine. SOFT TISSUES: No prevertebral  soft tissue swelling. IMPRESSION: 1. No acute intracranial abnormality. Atrophy with small vessel ischemic changes. Old bilateral lacunar infarcts. 2. No traumatic injury to the cervical spine. Electronically signed by: Pinkie Pebbles MD 03/07/2024 10:17 PM EDT RP Workstation: [id number]     Procedures   Medications Ordered in the ED - No data to display                                  Medical Decision Making Amount and/or Complexity of Data Reviewed Radiology: ordered and independent interpretation performed. Decision-making details documented in ED Course.   Differential diagnosis considered includes, but not limited to: Scalp contusion; skull fracture; intracranial bleed; cervical spine injury  Presents to the emergency department after a fall.  Patient does take  Plavix .  She is awake and alert, at her neurologic baseline.  She essentially has no complaints but does have a hematoma on the posterior scalp.  CT head and cervical spine negative.  Repeat examination reveals no other evidence of injury.  Will discharge.     Final diagnoses:  Contusion of scalp, initial encounter    ED Discharge Orders     None          Hevin Jeffcoat, Lonni PARAS, MD 03/08/24 [phone]

## 2024-04-05 DIAGNOSIS — I129 Hypertensive chronic kidney disease with stage 1 through stage 4 chronic kidney disease, or unspecified chronic kidney disease: Secondary | ICD-10-CM | POA: Diagnosis not present

## 2024-04-05 DIAGNOSIS — N1831 Chronic kidney disease, stage 3a: Secondary | ICD-10-CM | POA: Diagnosis not present

## 2024-04-05 DIAGNOSIS — S32501D Unspecified fracture of right pubis, subsequent encounter for fracture with routine healing: Secondary | ICD-10-CM | POA: Diagnosis not present

## 2024-04-05 DIAGNOSIS — I69320 Aphasia following cerebral infarction: Secondary | ICD-10-CM | POA: Diagnosis not present

## 2024-04-05 DIAGNOSIS — I69351 Hemiplegia and hemiparesis following cerebral infarction affecting right dominant side: Secondary | ICD-10-CM | POA: Diagnosis not present

## 2024-06-06 DIAGNOSIS — Z1331 Encounter for screening for depression: Secondary | ICD-10-CM | POA: Diagnosis not present

## 2024-06-06 DIAGNOSIS — R7303 Prediabetes: Secondary | ICD-10-CM | POA: Diagnosis not present

## 2024-06-06 DIAGNOSIS — E782 Mixed hyperlipidemia: Secondary | ICD-10-CM | POA: Diagnosis not present

## 2024-06-06 DIAGNOSIS — I2510 Atherosclerotic heart disease of native coronary artery without angina pectoris: Secondary | ICD-10-CM | POA: Diagnosis not present

## 2024-06-06 DIAGNOSIS — I10 Essential (primary) hypertension: Secondary | ICD-10-CM | POA: Diagnosis not present

## 2024-06-06 DIAGNOSIS — Z0000 Encounter for general adult medical examination without abnormal findings: Secondary | ICD-10-CM | POA: Diagnosis not present

## 2024-06-06 DIAGNOSIS — I679 Cerebrovascular disease, unspecified: Secondary | ICD-10-CM | POA: Diagnosis not present

## 2024-06-06 DIAGNOSIS — N1832 Chronic kidney disease, stage 3b: Secondary | ICD-10-CM | POA: Diagnosis not present

## 2024-06-19 ENCOUNTER — Emergency Department (HOSPITAL_COMMUNITY)

## 2024-06-19 ENCOUNTER — Other Ambulatory Visit: Payer: Self-pay

## 2024-06-19 ENCOUNTER — Encounter (HOSPITAL_COMMUNITY): Payer: Self-pay

## 2024-06-19 ENCOUNTER — Emergency Department (HOSPITAL_COMMUNITY)
Admission: EM | Admit: 2024-06-19 | Discharge: 2024-06-20 | Disposition: A | Discharge status: Home / Self Care | Attending: Emergency Medicine | Admitting: Emergency Medicine

## 2024-06-19 DIAGNOSIS — Z888 Allergy status to other drugs, medicaments and biological substances status: Secondary | ICD-10-CM | POA: Insufficient documentation

## 2024-06-19 DIAGNOSIS — I2510 Atherosclerotic heart disease of native coronary artery without angina pectoris: Secondary | ICD-10-CM | POA: Diagnosis not present

## 2024-06-19 DIAGNOSIS — I10 Essential (primary) hypertension: Secondary | ICD-10-CM | POA: Diagnosis not present

## 2024-06-19 DIAGNOSIS — J181 Lobar pneumonia, unspecified organism: Secondary | ICD-10-CM | POA: Insufficient documentation

## 2024-06-19 DIAGNOSIS — R0789 Other chest pain: Secondary | ICD-10-CM | POA: Diagnosis not present

## 2024-06-19 DIAGNOSIS — Z8673 Personal history of transient ischemic attack (TIA), and cerebral infarction without residual deficits: Secondary | ICD-10-CM | POA: Diagnosis not present

## 2024-06-19 DIAGNOSIS — J168 Pneumonia due to other specified infectious organisms: Secondary | ICD-10-CM | POA: Insufficient documentation

## 2024-06-19 DIAGNOSIS — R079 Chest pain, unspecified: Secondary | ICD-10-CM | POA: Diagnosis not present

## 2024-06-19 DIAGNOSIS — F0390 Unspecified dementia without behavioral disturbance: Secondary | ICD-10-CM | POA: Diagnosis not present

## 2024-06-19 DIAGNOSIS — I700 Atherosclerosis of aorta: Secondary | ICD-10-CM | POA: Insufficient documentation

## 2024-06-19 DIAGNOSIS — R0602 Shortness of breath: Secondary | ICD-10-CM | POA: Diagnosis present

## 2024-06-19 DIAGNOSIS — Z85828 Personal history of other malignant neoplasm of skin: Secondary | ICD-10-CM | POA: Insufficient documentation

## 2024-06-19 DIAGNOSIS — R918 Other nonspecific abnormal finding of lung field: Secondary | ICD-10-CM | POA: Diagnosis not present

## 2024-06-19 DIAGNOSIS — J189 Pneumonia, unspecified organism: Secondary | ICD-10-CM

## 2024-06-19 LAB — CBC
HCT: 43.4 % (ref 36.0–46.0)
Hemoglobin: 14 g/dL (ref 12.0–15.0)
MCH: 29.2 pg (ref 26.0–34.0)
MCHC: 32.3 g/dL (ref 30.0–36.0)
MCV: 90.6 fL (ref 80.0–100.0)
Platelets: 198 K/uL (ref 150–400)
RBC: 4.79 MIL/uL (ref 3.87–5.11)
RDW: 12.2 % (ref 11.5–15.5)
WBC: 8.2 K/uL (ref 4.0–10.5)
nRBC: 0 % (ref 0.0–0.2)

## 2024-06-19 LAB — COMPREHENSIVE METABOLIC PANEL WITH GFR
ALT: 12 U/L (ref 0–44)
AST: 22 U/L (ref 15–41)
Albumin: 3.1 g/dL — ABNORMAL LOW (ref 3.5–5.0)
Alkaline Phosphatase: 46 U/L (ref 38–126)
Anion gap: 11 (ref 5–15)
BUN: 20 mg/dL (ref 8–23)
CO2: 27 mmol/L (ref 22–32)
Calcium: 8.9 mg/dL (ref 8.9–10.3)
Chloride: 101 mmol/L (ref 98–111)
Creatinine, Ser: 1.24 mg/dL — ABNORMAL HIGH (ref 0.44–1.00)
GFR, Estimated: 42 mL/min — ABNORMAL LOW (ref >60)
Glucose, Bld: 123 mg/dL — ABNORMAL HIGH (ref 70–99)
Potassium: 3.9 mmol/L (ref 3.5–5.1)
Sodium: 139 mmol/L (ref 135–145)
Total Bilirubin: 1 mg/dL (ref 0.0–1.2)
Total Protein: 6.4 g/dL — ABNORMAL LOW (ref 6.5–8.1)

## 2024-06-19 LAB — TROPONIN I (HIGH SENSITIVITY): Troponin I (High Sensitivity): 10 ng/L (ref <18)

## 2024-06-19 LAB — BRAIN NATRIURETIC PEPTIDE: B Natriuretic Peptide: 179.5 pg/mL — ABNORMAL HIGH (ref 0.0–100.0)

## 2024-06-19 NOTE — ED Triage Notes (Signed)
 Pt arrived from home via Pov with daughter at side. Pt states that she has had left sided chest pain for a few days now. 6/10 on pain scale. Pt states that she has had intermittent.

## 2024-06-19 NOTE — ED Provider Triage Note (Signed)
 Emergency Medicine Provider Triage Evaluation Note  Crystal Day , a 89 y.o. female  was evaluated in triage.  Pt complains of chest pain. Reports pain started yesterday, right sided, does not radiate. Reports shortness of breath. No leg pain or leg swelling. Denies cough or congestion  Review of Systems  Positive: Negative:   Physical Exam  BP (!) 149/77 (BP Location: Right Arm)   Pulse 73   Temp 97.8 F (36.6 C) (Oral)   Resp 20   SpO2 94%  Gen:   Awake, no distress   Resp:  Normal effort MSK:   Moves extremities without difficulty  Other:    Medical Decision Making  Medically screening exam initiated at 9:17 PM.  Appropriate orders placed.  Crystal Day was informed that the remainder of the evaluation will be completed by another provider, this initial triage assessment does not replace that evaluation, and the importance of remaining in the ED until their evaluation is complete.     Crystal Day 06/19/24 2120

## 2024-06-20 ENCOUNTER — Emergency Department (HOSPITAL_COMMUNITY)

## 2024-06-20 DIAGNOSIS — I722 Aneurysm of renal artery: Secondary | ICD-10-CM | POA: Diagnosis not present

## 2024-06-20 DIAGNOSIS — R918 Other nonspecific abnormal finding of lung field: Secondary | ICD-10-CM | POA: Diagnosis not present

## 2024-06-20 DIAGNOSIS — J929 Pleural plaque without asbestos: Secondary | ICD-10-CM | POA: Diagnosis not present

## 2024-06-20 LAB — TROPONIN I (HIGH SENSITIVITY): Troponin I (High Sensitivity): 10 ng/L (ref <18)

## 2024-06-20 MED ORDER — CEFUROXIME AXETIL 500 MG PO TABS: 500.0000 mg | ORAL_TABLET | Freq: Two times a day (BID) | ORAL | 0 refills | Status: AC

## 2024-06-20 MED ORDER — IOHEXOL 350 MG/ML SOLN
75.0000 mL | Freq: Once | INTRAVENOUS | Status: AC | PRN
  Administered 2024-06-20: 75 mL via INTRAVENOUS

## 2024-06-20 MED ORDER — ONDANSETRON 4 MG PO TBDP
4.0000 mg | ORAL_TABLET | Freq: Three times a day (TID) | ORAL | 0 refills | Status: AC | PRN
Start: 2024-06-20 — End: ?

## 2024-06-20 MED ORDER — DOXYCYCLINE HYCLATE 100 MG PO CAPS
100.0000 mg | ORAL_CAPSULE | Freq: Two times a day (BID) | ORAL | 0 refills | Status: AC
Start: 2024-06-20 — End: ?

## 2024-06-20 NOTE — Discharge Instructions (Addendum)
 You were seen today for chest pain.  Your chest pain workup is reassuring overall.  It does appear that you may have a developing pneumonia.  You will be placed on antibiotics.  Given your antibiotic allergies, you will be placed on doxycycline.  It appears that you have had some nausea with medications like this in the past so nausea medication has also been sent to the pharmacy.  Follow-up for any new or worsening symptoms.

## 2024-06-20 NOTE — ED Provider Notes (Signed)
 [place] EMERGENCY DEPARTMENT AT [facility] Provider Note   CSN: [id number] Arrival date & time: 06/19/24  2027     Patient presents with: Chest Pain   Crystal Day is a 89 y.o. female.   HPI     This is an 89 year old female who presents with chest pain.  She presents with her daughter.  At baseline has a history of dementia.  Daughter reports that she began to complain of some chest discomfort on Sunday that continued all day yesterday.  It is anterior and right sided.  No shortness of breath.  No recent cough or fevers.  No history of blood clots.  Prior to Admission medications   Medication Sig Start Date End Date Taking? Authorizing Provider  cefUROXime (CEFTIN) 500 MG tablet Take 1 tablet (500 mg total) by mouth 2 (two) times daily with a meal. 06/20/24  Yes Mckenley Birenbaum, Charmaine FALCON, MD  doxycycline (VIBRAMYCIN) 100 MG capsule Take 1 capsule (100 mg total) by mouth 2 (two) times daily. 06/20/24  Yes Elizebath Wever, Charmaine FALCON, MD  ondansetron  (ZOFRAN -ODT) 4 MG disintegrating tablet Take 1 tablet (4 mg total) by mouth every 8 (eight) hours as needed. 06/20/24  Yes Javel Hersh, Charmaine FALCON, MD  albuterol  (VENTOLIN  HFA) 108 (90 Base) MCG/ACT inhaler Inhale 2 puffs into the lungs every 4 (four) hours as needed.    [provider]  clopidogrel  (PLAVIX ) 75 MG tablet Take 1 tablet (75 mg total) by mouth daily. 04/15/23   Sater, Charlie LABOR, MD  escitalopram  (LEXAPRO ) 5 MG tablet Take 5 mg by mouth daily.    [provider]  HYDROcodone -acetaminophen  (NORCO/VICODIN) 5-325 MG tablet Take 1-2 tablets by mouth every 4 (four) hours as needed for moderate pain (pain score 4-6). 01/12/24   Vernon Ranks, MD  losartan  (COZAAR ) 100 MG tablet Take 1 tablet (100 mg total) by mouth daily. 01/14/24   Arlon Carliss ORN, DO  metoprolol  succinate (TOPROL -XL) 25 MG 24 hr tablet Take 25 mg by mouth daily. 11/30/23   [provider]    Allergies: Penicillins, Tetracyclines & related, Elavil  [amitriptyline], Erythromycin, and Macrodantin [nitrofurantoin macrocrystal]    Review of Systems  Constitutional:  Negative for fever.  Respiratory:  Negative for shortness of breath.   Cardiovascular:  Positive for chest pain. Negative for leg swelling.  Gastrointestinal:  Negative for abdominal pain.  All other systems reviewed and are negative.   Updated Vital Signs BP (!) 154/80 (BP Location: Right Arm)   Pulse 65   Temp 97.7 F (36.5 C) (Oral)   Resp 18   Ht 1.778 m (5' 10)   Wt 61 kg   SpO2 100%   BMI 19.30 kg/m   Physical Exam Vitals and nursing note reviewed.  Constitutional:      Appearance: She is well-developed.     Comments: Elderly, non-ill-appearing  HENT:     Head: Normocephalic and atraumatic.  Eyes:     Pupils: Pupils are equal, round, and reactive to light.  Cardiovascular:     Rate and Rhythm: Normal rate and regular rhythm.     Heart sounds: Normal heart sounds.  Pulmonary:     Effort: Pulmonary effort is normal. No respiratory distress.     Breath sounds: No wheezing.  Chest:     Chest wall: No tenderness.  Abdominal:     General: Bowel sounds are normal.     Palpations: Abdomen is soft.  Musculoskeletal:     Cervical back: Neck supple.  Right lower leg: No tenderness. No edema.     Left lower leg: No tenderness. No edema.  Skin:    General: Skin is warm and dry.  Neurological:     Mental Status: She is alert.     Comments: Oriented to self and place but not time  Psychiatric:        Mood and Affect: Mood normal.     (all labs ordered are listed, but only abnormal results are displayed) Labs Reviewed  COMPREHENSIVE METABOLIC PANEL WITH GFR - Abnormal; Notable for the following components:      Result Value   Glucose, Bld 123 (*)    Creatinine, Ser 1.24 (*)    Total Protein 6.4 (*)    Albumin 3.1 (*)    GFR, Estimated 42 (*)    All other components within normal limits  BRAIN NATRIURETIC PEPTIDE - Abnormal; Notable for the  following components:   B Natriuretic Peptide 179.5 (*)    All other components within normal limits  CBC  TROPONIN I (HIGH SENSITIVITY)  TROPONIN I (HIGH SENSITIVITY)    EKG: EKG Interpretation Date/Time:  Monday June 19 2024 20:40:34 EST Ventricular Rate:  70 PR Interval:  156 QRS Duration:  82 QT Interval:  390 QTC Calculation: 421 R Axis:   77  Text Interpretation: Normal sinus rhythm Right atrial enlargement Nonspecific ST and T wave abnormality Abnormal ECG When compared with ECG of 10-Jan-2024 04:05, PREVIOUS ECG IS PRESENT Confirmed by Bari Pfeiffer ([id number]) on 06/20/2024 4:03:40 AM  Radiology: CT Angio Chest PE W and/or Wo Contrast Result Date: 06/20/2024 EXAM: CTA of the Chest with contrast for PE 06/20/2024 05:30:25 AM TECHNIQUE: CTA of the chest was performed without and with the administration of 75 mL of iohexol  (OMNIPAQUE ) 350 MG/ML injection. Multiplanar reformatted images are provided for review. MIP images are provided for review. Automated exposure control, iterative reconstruction, and/or weight based adjustment of the mA/kV was utilized to reduce the radiation dose to as low as reasonably achievable. COMPARISON: None available. CLINICAL HISTORY: Pulmonary embolism (PE) suspected, high prob. FINDINGS: PULMONARY ARTERIES: Pulmonary arteries are adequately opacified for evaluation. No pulmonary embolism. Main pulmonary artery is normal in caliber. MEDIASTINUM: The heart and pericardium demonstrate coronary artery calcifications. No acute abnormality. Aortic atherosclerosis. No acute abnormality of the thoracic aorta. LYMPH NODES: No mediastinal, hilar or axillary lymphadenopathy. LUNGS AND PLEURA: Diffuse bronchial wall thickening identified. Mild mosaic attenuation pattern suggesting small airways disease. Within the inferior right middle lobe there are areas of mucoid impaction, tree in bud nodularity, and subpleural consolidation. The area of subpleural consolidation  in the right middle lobe is peripheral. with a nodular. configuration measuring 2.5 x 2.1 cm. Within the posteromedial left lower lobe there is a cluster of tree in bud nodularity with mucoid impaction, image 88/6. No pleural effusion or pneumothorax. UPPER ABDOMEN: No acute abnormality within the imaged portions of the upper abdomen. Partially visualized simple appearing cyst measures 4 cm, image 169/5. No follow up imaging recommended. 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 SOFT TISSUES AND BONES: Thoracic spondylosis. No acute or suspicious osseous findings. No acute soft tissue abnormality. IMPRESSION: 1. No pulmonary embolism. 2. Findings consistent with infectious or inflammatory bronchiolitis with right middle lobe pneumonia. Advised follow-up imaging to ensure complete resolution of the nodular consolidative change in the right middle lobe and to exclude underlying malignancy. 3. Additional area tree-in-bud nodularity with mucoid impaction noted in the posteromedial left lower lobe compatible with an  inflammatory or infectious bronchiolitis. Underlying chronic indolent atypical infection such as mai is not excluded. 4. Diffuse bronchial wall thickening with mosaic attenuation pattern compatible with small airways disease. 5. Aortic atherosclerosis and coronary artery calcification. Electronically signed by: Waddell Calk MD 06/20/2024 06:29 AM EST RP Workstation: [id number]   DG Chest 2 View Result Date: 06/19/2024 CLINICAL DATA:  Chest pain EXAM: CHEST - 2 VIEW COMPARISON:  01/09/2024 FINDINGS: Frontal and lateral views of the chest demonstrate an unremarkable cardiac silhouette. There is patchy consolidation within the right middle lobe, which could reflect focal bronchopneumonia. No effusion or pneumothorax. No acute bony abnormalities. IMPRESSION: 1. Patchy right middle lobe consolidation which could reflect bronchopneumonia in the appropriate setting. Followup PA and  lateral chest X-ray is recommended in 3-4 weeks following trial of antibiotic therapy to ensure resolution and exclude underlying malignancy. Electronically Signed   By: Ozell Daring M.D.   On: 06/19/2024 21:46     Procedures   Medications Ordered in the ED  iohexol  (OMNIPAQUE ) 350 MG/ML injection 75 mL (75 mLs Intravenous Contrast Given 06/20/24 0530)                                    Medical Decision Making Amount and/or Complexity of Data Reviewed Radiology: ordered.  Risk Prescription drug management.   This patient presents to the ED for concern of chest pain, this involves an extensive number of treatment options, and is a complaint that carries with it a high risk of complications and morbidity.  I considered the following differential and admission for this acute, potentially life threatening condition.  The differential diagnosis includes ACS, PE, pneumothorax, pneumonia, chest wall pain  MDM:    This is an 89 year old female who presents with chest pain.  Daughter helps provide the history as patient has some dementia.  Reported some chest discomfort on the right.  No additional symptoms including shortness of breath, cough, fever.  She is overall nontoxic and vital signs are reassuring.  She is in no respiratory distress.  No reproducible symptoms on exam.  EKG shows no evidence of acute ischemia or arrhythmia.  Chest x-ray shows.  Patchy right middle lobe consolidation which may represent bronchopneumonia.  Patient does not have any other symptoms.  She does not have a leukocytosis or fever.  CT imaging was obtained to rule out PE and further characterize this finding on x-ray given otherwise normal workup including troponins.  CT scan again is characterized as likely inflammatory or infectious in nature and bronchopneumonia is still on the list.  Patient does not endorse any further symptoms; however feel it is reasonable to treat given that the changes are on the same side  where she is having pain.  She has multiple allergies to medications.  Given this, the combination of Keflex and doxycycline were chosen to cover for pneumonia.  She does list nausea as a side effect to tetracyclines.  She will be given Zofran .  Discussed this with the patient and her daughter who are agreeable with plan.  (Labs, imaging, consults)  Labs: I Ordered, and personally interpreted labs.  The pertinent results include: CBC, BMP, troponin  Imaging Studies ordered: I ordered imaging studies including x-ray, CT PE study I independently visualized and interpreted imaging. I agree with the radiologist interpretation  Additional history obtained from daughter at bedside.  External records from outside source obtained and reviewed including prior evaluations  Cardiac  Monitoring: The patient was maintained on a cardiac monitor.  If on the cardiac monitor, I personally viewed and interpreted the cardiac monitored which showed an underlying rhythm of: Sinus  Reevaluation: After the interventions noted above, I reevaluated the patient and found that they have :stayed the same  Social Determinants of Health:  lives with daughter  Disposition: Discharge  Co morbidities that complicate the patient evaluation  Past Medical History:  Diagnosis Date   Anginal pain since 2014   Arthritis    just in the hands   Cancer (HCC) 1988 or 89   basal cell carcinoma on face s/p Mohs procedure   Complication of anesthesia    nausea   History of wheezing    in spring and fall   Hyperlipidemia    Hypertension    Non-obstructive CAD    a. 09/2013 Cath/NSTEMI: nonobs dzs.   Non-STEMI (non-ST elevated myocardial infarction) (HCC)    a. 09/2013 - peak trop 5.38;  b. 09/2013 Cath: nonobs dzs-->Med Rx.   Pneumonia    in my 20's   PONV (postoperative nausea and vomiting)    Stroke [facility]) 2009   a. 09/2015 L Thalamic Lacunar infarct, presumed to be 2/2 small vessel dzs, residuals include numbness on  right side of lips and right finger tips; b. 09/2015 Echo: nl LV fxn; c. 09/2015 Carotid U/S: mild bilat ICA stenosis.     Medicines Meds ordered this encounter  Medications   iohexol  (OMNIPAQUE ) 350 MG/ML injection 75 mL   doxycycline (VIBRAMYCIN) 100 MG capsule    Sig: Take 1 capsule (100 mg total) by mouth 2 (two) times daily.    Dispense:  20 capsule    Refill:  0   ondansetron  (ZOFRAN -ODT) 4 MG disintegrating tablet    Sig: Take 1 tablet (4 mg total) by mouth every 8 (eight) hours as needed.    Dispense:  20 tablet    Refill:  0   cefUROXime (CEFTIN) 500 MG tablet    Sig: Take 1 tablet (500 mg total) by mouth 2 (two) times daily with a meal.    Dispense:  20 tablet    Refill:  0    I have reviewed the patients home medicines and have made adjustments as needed  Problem List / ED Course: Problem List Items Addressed This Visit   None Visit Diagnoses       Atypical chest pain    -  Primary     Pneumonia of right middle lobe due to infectious organism       Relevant Medications   doxycycline (VIBRAMYCIN) 100 MG capsule   cefUROXime (CEFTIN) 500 MG tablet                Final diagnoses:  Atypical chest pain  Pneumonia of right middle lobe due to infectious organism    ED Discharge Orders          Ordered    doxycycline (VIBRAMYCIN) 100 MG capsule  2 times daily        06/20/24 0644    ondansetron  (ZOFRAN -ODT) 4 MG disintegrating tablet  Every 8 hours PRN        06/20/24 0644    cefUROXime (CEFTIN) 500 MG tablet  2 times daily with meals        06/20/24 0649               Bari Charmaine FALCON, MD 06/20/24 [phone]
